# Patient Record
Sex: Female | Born: 1935 | Race: White | Hispanic: No | Smoking: Never smoker
Health system: Southern US, Community
[De-identification: ages and names within clinical notes are randomized; demographics above are authoritative.]

## PROBLEM LIST (undated history)

## (undated) DIAGNOSIS — G300 Alzheimer's disease with early onset: Secondary | ICD-10-CM

## (undated) DIAGNOSIS — R011 Cardiac murmur, unspecified: Secondary | ICD-10-CM

## (undated) DIAGNOSIS — I10 Essential (primary) hypertension: Secondary | ICD-10-CM

## (undated) DIAGNOSIS — F32A Depression, unspecified: Secondary | ICD-10-CM

## (undated) DIAGNOSIS — F419 Anxiety disorder, unspecified: Secondary | ICD-10-CM

## (undated) DIAGNOSIS — F0280 Dementia in other diseases classified elsewhere without behavioral disturbance: Secondary | ICD-10-CM

## (undated) DIAGNOSIS — K5790 Diverticulosis of intestine, part unspecified, without perforation or abscess without bleeding: Secondary | ICD-10-CM

## (undated) DIAGNOSIS — Z8701 Personal history of pneumonia (recurrent): Secondary | ICD-10-CM

## (undated) DIAGNOSIS — C4491 Basal cell carcinoma of skin, unspecified: Secondary | ICD-10-CM

## (undated) DIAGNOSIS — M1990 Unspecified osteoarthritis, unspecified site: Secondary | ICD-10-CM

## (undated) DIAGNOSIS — K5909 Other constipation: Secondary | ICD-10-CM

## (undated) DIAGNOSIS — G4733 Obstructive sleep apnea (adult) (pediatric): Secondary | ICD-10-CM

## (undated) DIAGNOSIS — F329 Major depressive disorder, single episode, unspecified: Secondary | ICD-10-CM

## (undated) HISTORY — DX: Personal history of pneumonia (recurrent): Z87.01

## (undated) HISTORY — DX: Anxiety disorder, unspecified: F41.9

## (undated) HISTORY — DX: Unspecified osteoarthritis, unspecified site: M19.90

## (undated) HISTORY — DX: Obstructive sleep apnea (adult) (pediatric): G47.33

## (undated) HISTORY — DX: Diverticulosis of intestine, part unspecified, without perforation or abscess without bleeding: K57.90

## (undated) HISTORY — DX: Other constipation: K59.09

## (undated) HISTORY — DX: Dementia in other diseases classified elsewhere, unspecified severity, without behavioral disturbance, psychotic disturbance, mood disturbance, and anxiety: F02.80

## (undated) HISTORY — DX: Cardiac murmur, unspecified: R01.1

## (undated) HISTORY — PX: OTHER SURGICAL HISTORY: SHX169

## (undated) HISTORY — DX: Alzheimer's disease with early onset: G30.0

## (undated) HISTORY — DX: Basal cell carcinoma of skin, unspecified: C44.91

## (undated) HISTORY — DX: Essential (primary) hypertension: I10

---

## 1994-11-30 HISTORY — PX: OTHER SURGICAL HISTORY: SHX169

## 2008-02-01 ENCOUNTER — Ambulatory Visit (HOSPITAL_COMMUNITY): Admission: RE | Admit: 2008-02-01 | Discharge: 2008-02-01 | Payer: Self-pay | Admitting: Interventional Radiology

## 2008-02-01 ENCOUNTER — Encounter (INDEPENDENT_AMBULATORY_CARE_PROVIDER_SITE_OTHER): Payer: Self-pay | Admitting: Interventional Radiology

## 2008-02-13 ENCOUNTER — Encounter: Admission: RE | Admit: 2008-02-13 | Discharge: 2008-02-13 | Payer: Self-pay | Admitting: Interventional Radiology

## 2008-02-17 ENCOUNTER — Encounter: Payer: Self-pay | Admitting: Interventional Radiology

## 2008-03-05 ENCOUNTER — Encounter: Admission: RE | Admit: 2008-03-05 | Discharge: 2008-03-05 | Payer: Self-pay | Admitting: Interventional Radiology

## 2008-03-19 ENCOUNTER — Encounter: Admission: RE | Admit: 2008-03-19 | Discharge: 2008-03-19 | Payer: Self-pay | Admitting: Interventional Radiology

## 2008-07-12 ENCOUNTER — Ambulatory Visit: Payer: Self-pay | Admitting: Cardiology

## 2008-09-06 ENCOUNTER — Ambulatory Visit (HOSPITAL_COMMUNITY): Admission: RE | Admit: 2008-09-06 | Discharge: 2008-09-06 | Payer: Self-pay | Admitting: Ophthalmology

## 2008-09-27 ENCOUNTER — Ambulatory Visit (HOSPITAL_COMMUNITY): Admission: RE | Admit: 2008-09-27 | Discharge: 2008-09-27 | Payer: Self-pay | Admitting: Ophthalmology

## 2011-04-14 NOTE — Consult Note (Signed)
NAME:  Tracy Hayes, Tracy Hayes             ACCOUNT NO.:  [id number]   MEDICAL RECORD NO.:  [id number]          PATIENT TYPE:  OUT   LOCATION:  XRAY                         FACILITY:  [facility]   PHYSICIAN:  Sanjeev K. Deveshwar, M.D.DATE OF BIRTH:  [birth year]   DATE OF CONSULTATION:  01/26/2008  DATE OF DISCHARGE:                                 CONSULTATION   DATE OF CONSULTATION:  January 26, 2008.   CHIEF COMPLAINT:  Compression fracture.   HISTORY OF PRESENT ILLNESS:  This is a very pleasant 77 year old female  referred to Dr. Corliss Skains through the courtesy of Dr. Donzetta Sprung after  she developed low back pain approximately 4 weeks ago after she tried to  move a bed.  An MRI was performed on January 12, 2008 that showed an L5  compression fracture.  The patient was also noted to have foraminal  narrowing of the L5-S1 area.   As noted, the patient has had pain for at least 4 weeks.  She rates her  pain at times as a 2 on a 1-10 scale, at other times is a 10 on a 1-10  scale.  She is currently being treated with hydrocodone which does take  the edge off her pain.  However, she has been unable to find complete  relief from the pain.  Prior to this injury, she was very independent  and active.  She was able is to do all of her own self-care, all of her  own household chores and she was also able to care for other members of  the family who had been ill.  Now, she is very dependent.  She is unable  to do any of her own self-care other than she can ambulate to the  bathroom with a walker.  The patient and her family are anxious to find  some relief from her discomfort.  She presents today accompanied by her  son and daughter.   PAST MEDICAL HISTORY:  Significant for sinusitis, osteoarthritis,  osteoporosis, anxiety disorder, history of colon polyps, polymyalgia  rheumatica.  She has had some sort of thyroid problems.  However, she  reports that this has not been active recently.  She  occasionally  develops bronchitis.  She reports that she has had a bone density study  performed which was consistent with osteoporosis.   PAST SURGICAL HISTORY:  The patient has had a tonsillectomy.  She had a  skin cancer removed from her nose.  She had a knee replacement  approximately 4-6 years ago.  She reports that she occasionally has  nausea and vomiting as well as trouble waking up from anesthesia.   ALLERGIES:  1. ASPIRIN which causes fever and itching.  2. SULFA.  3. CAFFEINE.  4. CODEINE.  5. VITAMIN B12.   CURRENT MEDICATIONS:  1. Prednisone 5 mg daily.  2. Lisinopril 20 mg daily.  3. Cymbalta 30 mg.  4. Lorazepam 0.5 mg.  Some of these frequencies are not clear from the      list that the patient brought.  5. Calcium with vitamin D.  6. Fosamax 70 mg once a  week.  7. Hydrocodone for pain.  8. Flexeril 10 mg at bedtime for spasm.   SOCIAL HISTORY:  The patient is widowed.  She has 3 children.  She lives  in [place].  She has never smoked or used alcohol.  She has been active all  of her life.  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   FAMILY HISTORY:  Mother died at age 49 from CVA.  Her father died at age  52 from CVA.  He also had coronary artery disease.   IMPRESSION/PLAN:  As noted, the patient presents today for evaluation of  her L5 compression fracture.  Dr. Corliss Skains reviewed the results of the  MRI which the family provided on a disk from [facility].  He  pointed out the L5 fracture.  He also pointed out the foraminal  narrowing at L5-S1.  She has had radiation of pain down her right leg.  Dr. Corliss Skains feels it is due to this narrowing.  He is hoping that  treating the compression fracture will also improve this discomfort.   The compression fractures were discussed in general.  The kyphoplasty  procedure as well as vertebroplasty procedures were also explained along  with the risks and  benefits.  The patient and her family were shown a  video describing these fractures and the kyphoplasty procedure.  The  risks and benefits were discussed.  All of their questions were  answered.  They would like to go ahead and schedule the intervention for  relief of pain and stabilization of the fracture.   Greater than 40 minutes were spent on this consult.      Delton See, P.A.    ______________________________  Grandville Silos. Corliss Skains, M.D.    DR/MEDQ  D:  01/26/2008  T:  01/27/2008  Job:  [id number]   cc:   Donzetta Sprung

## 2011-04-14 NOTE — Consult Note (Signed)
NAME:  Tracy Hayes, Tracy Hayes             ACCOUNT NO.:  [id number]   MEDICAL RECORD NO.:  [id number]          PATIENT TYPE:  OUT   LOCATION:  XRAY                         FACILITY:  [facility]   PHYSICIAN:  Sanjeev K. Deveshwar, M.D.DATE OF BIRTH:  29-Jul-1924   DATE OF CONSULTATION:  DATE OF DISCHARGE:                                 CONSULTATION   NOTE:  This is a followup visit, February 17, 2008.   CHIEF COMPLAINT:  Status post L5 kyphoplasty performed January 31, 2008.   BRIEF HISTORY:  This is a very pleasant 77 year old female referred to  Dr. Corliss Skains through the courtesy of Dr. Donzetta Sprung for evaluation of  a compression fracture at the L5 level which the patient developed when  she tried to move the bed.  An MRI was performed January 12, 2008, that  documented the L5 compression fracture but also showed foraminal  narrowing at L5-S1.   The patient underwent a kyphoplasty performed by Dr. Corliss Skains on January 31, 2008.  She also had a biopsy at that time that was negative for any  sign of malignancy.  The patient had some relief initially after her  kyphoplasty, however, she later developed recurrent pain in her right  hip radiating down her right leg.  Dr. Corliss Skains was contacted.  He felt  that this was probably due to her foraminal narrowing.  The patient was  referred for epidural injections.  She saw Dr. Benard Rink March 16 for an  epidural injection.  The patient stated that she had good relief of her  pain for 2-3 days, however, the pain has once again returned.  The  patient presents to the office today, March 20, accompanied by her  daughter to be seen in followup by Dr. Corliss Skains.   PAST MEDICAL HISTORY:  Significant for osteoporosis, history of thyroid  problems.  She has polymyalgia rheumatica.  She has history of anxiety.  She has a history of bronchitis.   ALLERGIES:  She is allergic to ASPIRIN, SULFA, CAFFEINE, CODEINE and  VITAMIN B12.   For medication list, social  history, family history and surgical history  please see dictated consult note from January 26, 2008.   IMPRESSION AND PLAN:  As noted the patient returns today to be seen in  followup by Dr. Corliss Skains.  She had an L5 kyphoplasty performed January 31, 2008, after which she obtained some relief from her back pain.  The back  pain has not returned although she is having pain in her right hip  radiating down her right leg.  Dr. Corliss Skains referred the patient for  epidural injections.  She had the first injection performed on March 16  by Dr. Benard Rink after which she obtained good relief for several days.  However, her pain has now returned.  She had previously been taking  hydrocodone for the pain, however, this caused nausea.  She now takes  Tylenol 2 tablets once or twice per day and gets some relief.   The patient reports that her pain is better when she is seated.  However, when she is up for any period of  time the pain returns and is  quite severe.  She is able to walk short distances with her walker,  although she is very dependent at this time.  Her daughter helps her  with her bathing and her dressing.  Previously the patient was very  independent and was actually caring for her older sister.   Dr. Corliss Skains has recommended continuing with the epidural injections.  After three injections if the patient is still having pain she is to  contact Dr. Corliss Skains who will refer the patient to a neurosurgeon to  see if there might be some other possible treatments for the patient's  discomfort.   The patient does have a history of osteoporosis.  She is on Fosamax.  She has had a previous bone density study in the past, apparently 6-7  years ago at [facility].  Dr. Corliss Skains recommended that she have  a repeat bone density study at [facility] for further evaluation  of her osteoporosis.   Greater than 15 minutes was spent on this consult.  Dr. Corliss Skains also  reviewed the  images from the previous kyphoplasty with the patient and  her daughter during this visit.  All their questions were answered.  They leave with a good understanding of the plan at this point.      Delton See, P.A.    ______________________________  Grandville Silos. Corliss Skains, M.D.    DR/MEDQ  D:  02/17/2008  T:  02/17/2008  Job:  [id number]   cc:   Henrene Pastor, M.D.

## 2011-08-24 LAB — CBC
HCT: 37.3
Hemoglobin: 12.6
MCHC: 33.9
RBC: 4.05

## 2011-08-24 LAB — BASIC METABOLIC PANEL
CO2: 26
Calcium: 9.2
GFR calc Af Amer: >60
Glucose, Bld: 108 — ABNORMAL HIGH
Potassium: 3.8
Sodium: 137

## 2011-08-24 LAB — PROTIME-INR: INR: 1

## 2011-09-01 LAB — BASIC METABOLIC PANEL
BUN: 23
CO2: 28
Chloride: 104
GFR calc Af Amer: >60
Potassium: 4.5

## 2011-09-01 LAB — HEMOGLOBIN AND HEMATOCRIT, BLOOD
HCT: 38.6
Hemoglobin: 13.1

## 2012-06-23 DIAGNOSIS — M6281 Muscle weakness (generalized): Secondary | ICD-10-CM

## 2014-04-17 ENCOUNTER — Encounter: Payer: Self-pay | Admitting: Cardiology

## 2014-04-30 ENCOUNTER — Encounter: Payer: Self-pay | Admitting: *Deleted

## 2014-05-02 ENCOUNTER — Encounter: Payer: Self-pay | Admitting: Cardiology

## 2014-05-02 ENCOUNTER — Other Ambulatory Visit: Payer: Self-pay | Admitting: *Deleted

## 2014-05-02 ENCOUNTER — Ambulatory Visit (INDEPENDENT_AMBULATORY_CARE_PROVIDER_SITE_OTHER): Payer: Medicare Other | Admitting: Cardiology

## 2014-05-02 VITALS — BP 144/82 | HR 67 | Ht 60.0 in | Wt 131.4 lb

## 2014-05-02 DIAGNOSIS — I10 Essential (primary) hypertension: Secondary | ICD-10-CM

## 2014-05-02 DIAGNOSIS — R55 Syncope and collapse: Secondary | ICD-10-CM

## 2014-05-02 NOTE — Progress Notes (Signed)
Clinical Summary Tracy Hayes is an 80 y.o.female referred for cardiology consultation by Dr. Quillian Quince. Record review finds recent observation at [facility] in May following an episode of apparent syncope. She was reportedly standing in her kitchen and lost consciousness, fell forward losing an incisor, no other major injuries. Head CT and head MRI did not show any acute events - had atrophy and chronic ischemic changes. Elevated d-dimer noted although no pulmonary embolus by chest CT. Cardiac markers argued against ACS, she had no arrhythmias by telemetry monitoring. Echocardiogram reported an LVEF of 60-65% with abnormal diastolic function, trace mitral regurgitation, RVSP 33 mm mercury.  She is here with her son today. She states that she has occasional feelings of dizziness, nothing progressive. Her son indicates that she has had some falls, etiologies have not always been clear. She does not recall any precipitating symptoms prior to the event outlined above, specifically was not dizzy, did not feel any vertigo, and had no chest pain or palpitations.  It sounds as if she has been documented to have orthostasis previously, although this was not the case in the hospital at [facility].  Recent ECG during her hospital observation showed sinus rhythm with right bundle branch block and LVH.    Allergies  Allergen Reactions  . Aspirin   . Benadryl [Diphenhydramine Hcl]   . Codeine   . Penicillins   . Phenobarbital   . Sulfa Antibiotics   . Vitamin B12   . Caffeine Palpitations    Current Outpatient Prescriptions  Medication Sig Dispense Refill  . alendronate (FOSAMAX) 70 MG tablet Take 70 mg by mouth once a week. Take with a full glass of water on an empty stomach.      Marland Kitchen LORazepam (ATIVAN) 0.5 MG tablet Take 0.5 mg by mouth every 8 (eight) hours as needed for anxiety.      Marland Kitchen losartan-hydrochlorothiazide (HYZAAR) 100-12.5 MG per tablet Take 1 tablet by mouth daily.      . sertraline (ZOLOFT)  50 MG tablet Take 50 mg by mouth 2 (two) times daily.       No current facility-administered medications for this visit.    Past Medical History  Diagnosis Date  . Essential hypertension, benign   . OSA (obstructive sleep apnea)   . Anxiety   . Basal cell carcinoma   . Cardiac murmur   . Diverticulosis   . DJD (degenerative joint disease)   . Alzheimer's disease with early onset   . Chronic constipation   . History of pneumonia     Past Surgical History  Procedure Laterality Date  . S/p nasal tip resection  1996  . S/p polypectomy      Family History  Problem Relation Age of Onset  . Stroke Mother   . Stroke Father     Social History Ms. Geary reports that she has never smoked. She has never used smokeless tobacco. Ms. Zertuche reports that she does not drink alcohol.  Review of Systems No exertional chest pain. Uses a walker intermittently. Stable appetite. No cough, fevers or chills. No bleeding problems. Otherwise as outlined above.  Physical Examination Filed Vitals:   05/02/14 0915  BP: 144/82  Pulse: 67   Filed Weights   05/02/14 0915  Weight: 131 lb 6.4 oz (59.603 kg)   Elderly woman, appears comfortable at rest. HEENT: Conjunctiva and lids normal, oropharynx clear. Neck: Supple, no elevated JVP or carotid bruits, no thyromegaly. Lungs: Clear to auscultation, nonlabored breathing at rest. Cardiac: Regular  rate and rhythm, S4 with soft systolic murmur, no pericardial rub. Abdomen: Soft, nontender, bowel sounds present, no guarding or rebound. Extremities: No pitting edema, distal pulses 1-2+. Skin: Warm and dry. Musculoskeletal: No kyphosis. Neuropsychiatric: Alert and oriented x3, affect grossly appropriate.   Problem List and Plan   Syncope Event as described above, fairly sudden onset while standing without obvious precipitant. She does have a reported propensity to orthostasis, although not documented during her hospital observation. ECG showed  sinus rhythm with right bundle branch block, no arrhythmias by telemetry, normal LVEF by echocardiogram, no ACS by enzymes. No obvious stroke by neuroimaging. Discussed with patient and son. Plan will be to obtain a 30 day event recorder to investigate any potential arrhythmic causes. Followup arranged.  Essential hypertension, benign Patient on Hyzaar, followed by Dr. Quillian Quince.    Satira Sark, M.D., F.A.C.C.

## 2014-05-02 NOTE — Assessment & Plan Note (Signed)
Patient on Hyzaar, followed by Dr. Quillian Quince.

## 2014-05-02 NOTE — Patient Instructions (Signed)
Your physician recommends that you schedule a follow-up appointment in: 1 month. Your physician recommends that you continue on your current medications as directed. Please refer to the Current Medication list given to you today. Your physician has recommended that you wear an event monitor. Event monitors are medical devices that record the heart's electrical activity. Doctors most often [place] these monitors to diagnose arrhythmias. Arrhythmias are problems with the speed or rhythm of the heartbeat. The monitor is a small, portable device. You can wear one while you do your normal daily activities. This is usually used to diagnose what is causing palpitations/syncope (passing out). [employer] will contact you about this monitor.

## 2014-05-02 NOTE — Assessment & Plan Note (Signed)
Event as described above, fairly sudden onset while standing without obvious precipitant. She does have a reported propensity to orthostasis, although not documented during her hospital observation. ECG showed sinus rhythm with right bundle branch block, no arrhythmias by telemetry, normal LVEF by echocardiogram, no ACS by enzymes. No obvious stroke by neuroimaging. Discussed with patient and son. Plan will be to obtain a 30 day event recorder to investigate any potential arrhythmic causes. Followup arranged.

## 2014-05-05 DIAGNOSIS — R55 Syncope and collapse: Secondary | ICD-10-CM

## 2014-06-12 ENCOUNTER — Ambulatory Visit (INDEPENDENT_AMBULATORY_CARE_PROVIDER_SITE_OTHER): Payer: Medicare Other | Admitting: Cardiology

## 2014-06-12 ENCOUNTER — Encounter: Payer: Self-pay | Admitting: Cardiology

## 2014-06-12 VITALS — BP 156/73 | HR 66 | Ht 60.0 in | Wt 130.1 lb

## 2014-06-12 DIAGNOSIS — R55 Syncope and collapse: Secondary | ICD-10-CM

## 2014-06-12 NOTE — Progress Notes (Signed)
Clinical Summary Tracy Hayes is an 80 y.o.female seen in the office back in June for further evaluation of syncope. She had undergone observation at [facility] in May following an episode of apparent syncope. She was reportedly standing in her kitchen and lost consciousness, fell forward losing an incisor, no other major injuries. Head CT and head MRI did not show any acute events - had atrophy and chronic ischemic changes. Elevated d-dimer noted although no pulmonary embolus by chest CT. Cardiac markers argued against ACS, she had no arrhythmias by telemetry monitoring. Echocardiogram reported an LVEF of 60-65% with abnormal diastolic function, trace mitral regurgitation, RVSP 33 mm mercury.  Followup cardiac monitor was arranged. [employer] monitor showed sinus rhythm with PAC, no pauses or arrhythmias noted however. I reviewed these results with the patient and her granddaughter today.  It is not clear that she has had any subsequent syncopal events, although she does tell me about a spell when she was talking to a friend and does not remember much about the conversation after she left.   Allergies  Allergen Reactions  . Aspirin   . Benadryl [Diphenhydramine Hcl]   . Codeine   . Penicillins   . Phenobarbital   . Sulfa Antibiotics   . Vitamin B12   . Caffeine Palpitations    Current Outpatient Prescriptions  Medication Sig Dispense Refill  . alendronate (FOSAMAX) 70 MG tablet Take 70 mg by mouth once a week. Take with a full glass of water on an empty stomach.      Marland Kitchen LORazepam (ATIVAN) 0.5 MG tablet Take 0.5 mg by mouth every 8 (eight) hours as needed for anxiety.      Marland Kitchen losartan-hydrochlorothiazide (HYZAAR) 100-12.5 MG per tablet Take 1 tablet by mouth daily.      . sertraline (ZOLOFT) 50 MG tablet Take 50 mg by mouth 2 (two) times daily.       No current facility-administered medications for this visit.    Past Medical History  Diagnosis Date  . Essential hypertension, benign   .  OSA (obstructive sleep apnea)   . Anxiety   . Basal cell carcinoma   . Cardiac murmur   . Diverticulosis   . DJD (degenerative joint disease)   . Alzheimer's disease with early onset   . Chronic constipation   . History of pneumonia     Social History Tracy Hayes reports that she has never smoked. She has never used smokeless tobacco. Tracy Hayes reports that she does not drink alcohol.  Review of Systems Sometimes unsteady on her feet, uses a walker intermittently. Other systems reviewed and negative.  Physical Examination Filed Vitals:   06/12/14 1513  BP: 156/73  Pulse: 66   Filed Weights   06/12/14 1513  Weight: 130 lb 1.9 oz (59.022 kg)    Elderly woman, appears comfortable at rest.  Seated in wheelchair. HEENT: Conjunctiva and lids normal, oropharynx clear.  Neck: Supple, no elevated JVP or carotid bruits, no thyromegaly.  Lungs: Clear to auscultation, nonlabored breathing at rest.  Cardiac: Regular rate and rhythm, S4 with soft systolic murmur, no pericardial rub.  Abdomen: Soft, nontender, bowel sounds present, no guarding or rebound.  Extremities: No pitting edema, distal pulses 1-2+.     Problem List and Plan   Syncope Etiology not clear, no definitive cardiac cause however documented. It is reassuring that she had recent documentation of normal LVEF, and cardiac monitoring did not demonstrate any pauses or significant tachyarrhythmias. No further cardiac workup planned  at this time. Certainly, if she continues to manifest sudden syncopal events without obvious cause, an implantable loop recorder could be considered.    Satira Sark, M.D., F.A.C.C.

## 2014-06-12 NOTE — Patient Instructions (Signed)
Your physician recommends that you schedule a follow-up appointment in: as needed Your physician recommends that you continue on your current medications as directed. Please refer to the Current Medication list given to you today.   

## 2014-06-12 NOTE — Assessment & Plan Note (Signed)
Etiology not clear, no definitive cardiac cause however documented. It is reassuring that she had recent documentation of normal LVEF, and cardiac monitoring did not demonstrate any pauses or significant tachyarrhythmias. No further cardiac workup planned at this time. Certainly, if she continues to manifest sudden syncopal events without obvious cause, an implantable loop recorder could be considered.

## 2014-12-11 DIAGNOSIS — E6609 Other obesity due to excess calories: Secondary | ICD-10-CM | POA: Diagnosis not present

## 2014-12-11 DIAGNOSIS — E782 Mixed hyperlipidemia: Secondary | ICD-10-CM | POA: Diagnosis not present

## 2014-12-11 DIAGNOSIS — G4733 Obstructive sleep apnea (adult) (pediatric): Secondary | ICD-10-CM | POA: Diagnosis not present

## 2014-12-11 DIAGNOSIS — E0580 Other thyrotoxicosis without thyrotoxic crisis or storm: Secondary | ICD-10-CM | POA: Diagnosis not present

## 2014-12-11 DIAGNOSIS — Z9189 Other specified personal risk factors, not elsewhere classified: Secondary | ICD-10-CM | POA: Diagnosis not present

## 2014-12-11 DIAGNOSIS — I10 Essential (primary) hypertension: Secondary | ICD-10-CM | POA: Diagnosis not present

## 2014-12-11 DIAGNOSIS — G300 Alzheimer's disease with early onset: Secondary | ICD-10-CM | POA: Diagnosis not present

## 2014-12-11 DIAGNOSIS — F325 Major depressive disorder, single episode, in full remission: Secondary | ICD-10-CM | POA: Diagnosis not present

## 2014-12-14 DIAGNOSIS — G300 Alzheimer's disease with early onset: Secondary | ICD-10-CM | POA: Diagnosis not present

## 2014-12-15 DIAGNOSIS — G300 Alzheimer's disease with early onset: Secondary | ICD-10-CM | POA: Diagnosis not present

## 2014-12-16 DIAGNOSIS — G300 Alzheimer's disease with early onset: Secondary | ICD-10-CM | POA: Diagnosis not present

## 2014-12-17 DIAGNOSIS — G300 Alzheimer's disease with early onset: Secondary | ICD-10-CM | POA: Diagnosis not present

## 2014-12-18 DIAGNOSIS — G300 Alzheimer's disease with early onset: Secondary | ICD-10-CM | POA: Diagnosis not present

## 2014-12-19 DIAGNOSIS — G300 Alzheimer's disease with early onset: Secondary | ICD-10-CM | POA: Diagnosis not present

## 2014-12-20 DIAGNOSIS — G300 Alzheimer's disease with early onset: Secondary | ICD-10-CM | POA: Diagnosis not present

## 2014-12-21 DIAGNOSIS — G300 Alzheimer's disease with early onset: Secondary | ICD-10-CM | POA: Diagnosis not present

## 2014-12-22 DIAGNOSIS — G300 Alzheimer's disease with early onset: Secondary | ICD-10-CM | POA: Diagnosis not present

## 2014-12-23 DIAGNOSIS — G300 Alzheimer's disease with early onset: Secondary | ICD-10-CM | POA: Diagnosis not present

## 2014-12-24 DIAGNOSIS — G300 Alzheimer's disease with early onset: Secondary | ICD-10-CM | POA: Diagnosis not present

## 2014-12-25 DIAGNOSIS — G300 Alzheimer's disease with early onset: Secondary | ICD-10-CM | POA: Diagnosis not present

## 2014-12-26 DIAGNOSIS — G300 Alzheimer's disease with early onset: Secondary | ICD-10-CM | POA: Diagnosis not present

## 2014-12-27 DIAGNOSIS — G300 Alzheimer's disease with early onset: Secondary | ICD-10-CM | POA: Diagnosis not present

## 2014-12-28 DIAGNOSIS — G300 Alzheimer's disease with early onset: Secondary | ICD-10-CM | POA: Diagnosis not present

## 2014-12-29 DIAGNOSIS — G300 Alzheimer's disease with early onset: Secondary | ICD-10-CM | POA: Diagnosis not present

## 2014-12-30 DIAGNOSIS — G300 Alzheimer's disease with early onset: Secondary | ICD-10-CM | POA: Diagnosis not present

## 2014-12-31 DIAGNOSIS — G300 Alzheimer's disease with early onset: Secondary | ICD-10-CM | POA: Diagnosis not present

## 2015-01-01 DIAGNOSIS — G300 Alzheimer's disease with early onset: Secondary | ICD-10-CM | POA: Diagnosis not present

## 2015-01-02 DIAGNOSIS — G300 Alzheimer's disease with early onset: Secondary | ICD-10-CM | POA: Diagnosis not present

## 2015-01-03 DIAGNOSIS — G300 Alzheimer's disease with early onset: Secondary | ICD-10-CM | POA: Diagnosis not present

## 2015-01-04 DIAGNOSIS — G300 Alzheimer's disease with early onset: Secondary | ICD-10-CM | POA: Diagnosis not present

## 2015-01-05 DIAGNOSIS — G300 Alzheimer's disease with early onset: Secondary | ICD-10-CM | POA: Diagnosis not present

## 2015-01-06 DIAGNOSIS — G300 Alzheimer's disease with early onset: Secondary | ICD-10-CM | POA: Diagnosis not present

## 2015-01-07 DIAGNOSIS — G300 Alzheimer's disease with early onset: Secondary | ICD-10-CM | POA: Diagnosis not present

## 2015-01-08 DIAGNOSIS — G300 Alzheimer's disease with early onset: Secondary | ICD-10-CM | POA: Diagnosis not present

## 2015-01-09 DIAGNOSIS — G300 Alzheimer's disease with early onset: Secondary | ICD-10-CM | POA: Diagnosis not present

## 2015-01-10 DIAGNOSIS — G300 Alzheimer's disease with early onset: Secondary | ICD-10-CM | POA: Diagnosis not present

## 2015-01-11 DIAGNOSIS — G300 Alzheimer's disease with early onset: Secondary | ICD-10-CM | POA: Diagnosis not present

## 2015-01-12 DIAGNOSIS — G300 Alzheimer's disease with early onset: Secondary | ICD-10-CM | POA: Diagnosis not present

## 2015-01-13 DIAGNOSIS — G300 Alzheimer's disease with early onset: Secondary | ICD-10-CM | POA: Diagnosis not present

## 2015-01-14 DIAGNOSIS — G300 Alzheimer's disease with early onset: Secondary | ICD-10-CM | POA: Diagnosis not present

## 2015-01-15 DIAGNOSIS — G300 Alzheimer's disease with early onset: Secondary | ICD-10-CM | POA: Diagnosis not present

## 2015-01-16 DIAGNOSIS — G300 Alzheimer's disease with early onset: Secondary | ICD-10-CM | POA: Diagnosis not present

## 2015-01-17 DIAGNOSIS — G300 Alzheimer's disease with early onset: Secondary | ICD-10-CM | POA: Diagnosis not present

## 2015-01-18 DIAGNOSIS — G300 Alzheimer's disease with early onset: Secondary | ICD-10-CM | POA: Diagnosis not present

## 2015-01-19 DIAGNOSIS — G300 Alzheimer's disease with early onset: Secondary | ICD-10-CM | POA: Diagnosis not present

## 2015-01-20 DIAGNOSIS — G300 Alzheimer's disease with early onset: Secondary | ICD-10-CM | POA: Diagnosis not present

## 2015-01-21 DIAGNOSIS — G300 Alzheimer's disease with early onset: Secondary | ICD-10-CM | POA: Diagnosis not present

## 2015-01-22 DIAGNOSIS — G300 Alzheimer's disease with early onset: Secondary | ICD-10-CM | POA: Diagnosis not present

## 2015-01-23 DIAGNOSIS — G300 Alzheimer's disease with early onset: Secondary | ICD-10-CM | POA: Diagnosis not present

## 2015-01-24 DIAGNOSIS — G300 Alzheimer's disease with early onset: Secondary | ICD-10-CM | POA: Diagnosis not present

## 2015-01-25 DIAGNOSIS — G300 Alzheimer's disease with early onset: Secondary | ICD-10-CM | POA: Diagnosis not present

## 2015-01-26 DIAGNOSIS — G300 Alzheimer's disease with early onset: Secondary | ICD-10-CM | POA: Diagnosis not present

## 2015-01-27 DIAGNOSIS — G300 Alzheimer's disease with early onset: Secondary | ICD-10-CM | POA: Diagnosis not present

## 2015-01-28 DIAGNOSIS — G300 Alzheimer's disease with early onset: Secondary | ICD-10-CM | POA: Diagnosis not present

## 2015-01-29 DIAGNOSIS — R32 Unspecified urinary incontinence: Secondary | ICD-10-CM | POA: Diagnosis not present

## 2015-01-29 DIAGNOSIS — G300 Alzheimer's disease with early onset: Secondary | ICD-10-CM | POA: Diagnosis not present

## 2015-01-30 DIAGNOSIS — G300 Alzheimer's disease with early onset: Secondary | ICD-10-CM | POA: Diagnosis not present

## 2015-01-31 DIAGNOSIS — G300 Alzheimer's disease with early onset: Secondary | ICD-10-CM | POA: Diagnosis not present

## 2015-02-01 DIAGNOSIS — G300 Alzheimer's disease with early onset: Secondary | ICD-10-CM | POA: Diagnosis not present

## 2015-02-02 DIAGNOSIS — G300 Alzheimer's disease with early onset: Secondary | ICD-10-CM | POA: Diagnosis not present

## 2015-02-03 DIAGNOSIS — G300 Alzheimer's disease with early onset: Secondary | ICD-10-CM | POA: Diagnosis not present

## 2015-02-04 DIAGNOSIS — G300 Alzheimer's disease with early onset: Secondary | ICD-10-CM | POA: Diagnosis not present

## 2015-02-05 DIAGNOSIS — G300 Alzheimer's disease with early onset: Secondary | ICD-10-CM | POA: Diagnosis not present

## 2015-02-06 DIAGNOSIS — G300 Alzheimer's disease with early onset: Secondary | ICD-10-CM | POA: Diagnosis not present

## 2015-02-07 DIAGNOSIS — G300 Alzheimer's disease with early onset: Secondary | ICD-10-CM | POA: Diagnosis not present

## 2015-02-08 DIAGNOSIS — G300 Alzheimer's disease with early onset: Secondary | ICD-10-CM | POA: Diagnosis not present

## 2015-02-09 DIAGNOSIS — G300 Alzheimer's disease with early onset: Secondary | ICD-10-CM | POA: Diagnosis not present

## 2015-02-10 DIAGNOSIS — G300 Alzheimer's disease with early onset: Secondary | ICD-10-CM | POA: Diagnosis not present

## 2015-02-11 DIAGNOSIS — G300 Alzheimer's disease with early onset: Secondary | ICD-10-CM | POA: Diagnosis not present

## 2015-02-12 DIAGNOSIS — G300 Alzheimer's disease with early onset: Secondary | ICD-10-CM | POA: Diagnosis not present

## 2015-02-13 DIAGNOSIS — G300 Alzheimer's disease with early onset: Secondary | ICD-10-CM | POA: Diagnosis not present

## 2015-02-14 DIAGNOSIS — G300 Alzheimer's disease with early onset: Secondary | ICD-10-CM | POA: Diagnosis not present

## 2015-02-15 DIAGNOSIS — G300 Alzheimer's disease with early onset: Secondary | ICD-10-CM | POA: Diagnosis not present

## 2015-02-16 DIAGNOSIS — G300 Alzheimer's disease with early onset: Secondary | ICD-10-CM | POA: Diagnosis not present

## 2015-02-17 DIAGNOSIS — G300 Alzheimer's disease with early onset: Secondary | ICD-10-CM | POA: Diagnosis not present

## 2015-02-18 DIAGNOSIS — G300 Alzheimer's disease with early onset: Secondary | ICD-10-CM | POA: Diagnosis not present

## 2015-02-19 DIAGNOSIS — G300 Alzheimer's disease with early onset: Secondary | ICD-10-CM | POA: Diagnosis not present

## 2015-02-20 DIAGNOSIS — G300 Alzheimer's disease with early onset: Secondary | ICD-10-CM | POA: Diagnosis not present

## 2015-02-21 DIAGNOSIS — G300 Alzheimer's disease with early onset: Secondary | ICD-10-CM | POA: Diagnosis not present

## 2015-02-22 DIAGNOSIS — G300 Alzheimer's disease with early onset: Secondary | ICD-10-CM | POA: Diagnosis not present

## 2015-02-23 DIAGNOSIS — G300 Alzheimer's disease with early onset: Secondary | ICD-10-CM | POA: Diagnosis not present

## 2015-02-24 DIAGNOSIS — G300 Alzheimer's disease with early onset: Secondary | ICD-10-CM | POA: Diagnosis not present

## 2015-02-25 DIAGNOSIS — G300 Alzheimer's disease with early onset: Secondary | ICD-10-CM | POA: Diagnosis not present

## 2015-02-26 DIAGNOSIS — G300 Alzheimer's disease with early onset: Secondary | ICD-10-CM | POA: Diagnosis not present

## 2015-02-27 DIAGNOSIS — F329 Major depressive disorder, single episode, unspecified: Secondary | ICD-10-CM | POA: Diagnosis not present

## 2015-02-27 DIAGNOSIS — M25552 Pain in left hip: Secondary | ICD-10-CM | POA: Diagnosis not present

## 2015-02-27 DIAGNOSIS — M25551 Pain in right hip: Secondary | ICD-10-CM | POA: Diagnosis not present

## 2015-02-27 DIAGNOSIS — M353 Polymyalgia rheumatica: Secondary | ICD-10-CM | POA: Diagnosis not present

## 2015-02-27 DIAGNOSIS — I10 Essential (primary) hypertension: Secondary | ICD-10-CM | POA: Diagnosis not present

## 2015-02-27 DIAGNOSIS — M545 Low back pain: Secondary | ICD-10-CM | POA: Diagnosis not present

## 2015-02-27 DIAGNOSIS — M810 Age-related osteoporosis without current pathological fracture: Secondary | ICD-10-CM | POA: Diagnosis not present

## 2015-02-27 DIAGNOSIS — G309 Alzheimer's disease, unspecified: Secondary | ICD-10-CM | POA: Diagnosis not present

## 2015-02-27 DIAGNOSIS — G300 Alzheimer's disease with early onset: Secondary | ICD-10-CM | POA: Diagnosis not present

## 2015-02-28 DIAGNOSIS — M545 Low back pain: Secondary | ICD-10-CM | POA: Diagnosis not present

## 2015-02-28 DIAGNOSIS — M810 Age-related osteoporosis without current pathological fracture: Secondary | ICD-10-CM | POA: Diagnosis not present

## 2015-02-28 DIAGNOSIS — M25551 Pain in right hip: Secondary | ICD-10-CM | POA: Diagnosis not present

## 2015-02-28 DIAGNOSIS — G300 Alzheimer's disease with early onset: Secondary | ICD-10-CM | POA: Diagnosis not present

## 2015-02-28 DIAGNOSIS — G309 Alzheimer's disease, unspecified: Secondary | ICD-10-CM | POA: Diagnosis not present

## 2015-02-28 DIAGNOSIS — F329 Major depressive disorder, single episode, unspecified: Secondary | ICD-10-CM | POA: Diagnosis not present

## 2015-02-28 DIAGNOSIS — M25552 Pain in left hip: Secondary | ICD-10-CM | POA: Diagnosis not present

## 2015-02-28 DIAGNOSIS — I10 Essential (primary) hypertension: Secondary | ICD-10-CM | POA: Diagnosis not present

## 2015-02-28 DIAGNOSIS — M353 Polymyalgia rheumatica: Secondary | ICD-10-CM | POA: Diagnosis not present

## 2015-03-01 DIAGNOSIS — G300 Alzheimer's disease with early onset: Secondary | ICD-10-CM | POA: Diagnosis not present

## 2015-03-02 DIAGNOSIS — G300 Alzheimer's disease with early onset: Secondary | ICD-10-CM | POA: Diagnosis not present

## 2015-03-03 DIAGNOSIS — G300 Alzheimer's disease with early onset: Secondary | ICD-10-CM | POA: Diagnosis not present

## 2015-03-04 DIAGNOSIS — M25551 Pain in right hip: Secondary | ICD-10-CM | POA: Diagnosis not present

## 2015-03-04 DIAGNOSIS — M353 Polymyalgia rheumatica: Secondary | ICD-10-CM | POA: Diagnosis not present

## 2015-03-04 DIAGNOSIS — I10 Essential (primary) hypertension: Secondary | ICD-10-CM | POA: Diagnosis not present

## 2015-03-04 DIAGNOSIS — G300 Alzheimer's disease with early onset: Secondary | ICD-10-CM | POA: Diagnosis not present

## 2015-03-04 DIAGNOSIS — M545 Low back pain: Secondary | ICD-10-CM | POA: Diagnosis not present

## 2015-03-04 DIAGNOSIS — F329 Major depressive disorder, single episode, unspecified: Secondary | ICD-10-CM | POA: Diagnosis not present

## 2015-03-04 DIAGNOSIS — G309 Alzheimer's disease, unspecified: Secondary | ICD-10-CM | POA: Diagnosis not present

## 2015-03-04 DIAGNOSIS — M25552 Pain in left hip: Secondary | ICD-10-CM | POA: Diagnosis not present

## 2015-03-04 DIAGNOSIS — M810 Age-related osteoporosis without current pathological fracture: Secondary | ICD-10-CM | POA: Diagnosis not present

## 2015-03-05 DIAGNOSIS — M25552 Pain in left hip: Secondary | ICD-10-CM | POA: Diagnosis not present

## 2015-03-05 DIAGNOSIS — I10 Essential (primary) hypertension: Secondary | ICD-10-CM | POA: Diagnosis not present

## 2015-03-05 DIAGNOSIS — G309 Alzheimer's disease, unspecified: Secondary | ICD-10-CM | POA: Diagnosis not present

## 2015-03-05 DIAGNOSIS — M545 Low back pain: Secondary | ICD-10-CM | POA: Diagnosis not present

## 2015-03-05 DIAGNOSIS — F329 Major depressive disorder, single episode, unspecified: Secondary | ICD-10-CM | POA: Diagnosis not present

## 2015-03-05 DIAGNOSIS — M810 Age-related osteoporosis without current pathological fracture: Secondary | ICD-10-CM | POA: Diagnosis not present

## 2015-03-05 DIAGNOSIS — M25551 Pain in right hip: Secondary | ICD-10-CM | POA: Diagnosis not present

## 2015-03-05 DIAGNOSIS — M353 Polymyalgia rheumatica: Secondary | ICD-10-CM | POA: Diagnosis not present

## 2015-03-05 DIAGNOSIS — G300 Alzheimer's disease with early onset: Secondary | ICD-10-CM | POA: Diagnosis not present

## 2015-03-06 DIAGNOSIS — G300 Alzheimer's disease with early onset: Secondary | ICD-10-CM | POA: Diagnosis not present

## 2015-03-07 DIAGNOSIS — I10 Essential (primary) hypertension: Secondary | ICD-10-CM | POA: Diagnosis not present

## 2015-03-07 DIAGNOSIS — G300 Alzheimer's disease with early onset: Secondary | ICD-10-CM | POA: Diagnosis not present

## 2015-03-07 DIAGNOSIS — G309 Alzheimer's disease, unspecified: Secondary | ICD-10-CM | POA: Diagnosis not present

## 2015-03-07 DIAGNOSIS — M353 Polymyalgia rheumatica: Secondary | ICD-10-CM | POA: Diagnosis not present

## 2015-03-07 DIAGNOSIS — M25552 Pain in left hip: Secondary | ICD-10-CM | POA: Diagnosis not present

## 2015-03-07 DIAGNOSIS — M25551 Pain in right hip: Secondary | ICD-10-CM | POA: Diagnosis not present

## 2015-03-07 DIAGNOSIS — M545 Low back pain: Secondary | ICD-10-CM | POA: Diagnosis not present

## 2015-03-07 DIAGNOSIS — M810 Age-related osteoporosis without current pathological fracture: Secondary | ICD-10-CM | POA: Diagnosis not present

## 2015-03-07 DIAGNOSIS — F329 Major depressive disorder, single episode, unspecified: Secondary | ICD-10-CM | POA: Diagnosis not present

## 2015-03-08 DIAGNOSIS — G300 Alzheimer's disease with early onset: Secondary | ICD-10-CM | POA: Diagnosis not present

## 2015-03-09 DIAGNOSIS — G300 Alzheimer's disease with early onset: Secondary | ICD-10-CM | POA: Diagnosis not present

## 2015-03-10 DIAGNOSIS — G300 Alzheimer's disease with early onset: Secondary | ICD-10-CM | POA: Diagnosis not present

## 2015-03-11 DIAGNOSIS — F329 Major depressive disorder, single episode, unspecified: Secondary | ICD-10-CM | POA: Diagnosis not present

## 2015-03-11 DIAGNOSIS — M353 Polymyalgia rheumatica: Secondary | ICD-10-CM | POA: Diagnosis not present

## 2015-03-11 DIAGNOSIS — G309 Alzheimer's disease, unspecified: Secondary | ICD-10-CM | POA: Diagnosis not present

## 2015-03-11 DIAGNOSIS — G300 Alzheimer's disease with early onset: Secondary | ICD-10-CM | POA: Diagnosis not present

## 2015-03-11 DIAGNOSIS — E6609 Other obesity due to excess calories: Secondary | ICD-10-CM | POA: Diagnosis not present

## 2015-03-11 DIAGNOSIS — F325 Major depressive disorder, single episode, in full remission: Secondary | ICD-10-CM | POA: Diagnosis not present

## 2015-03-11 DIAGNOSIS — M25552 Pain in left hip: Secondary | ICD-10-CM | POA: Diagnosis not present

## 2015-03-11 DIAGNOSIS — E782 Mixed hyperlipidemia: Secondary | ICD-10-CM | POA: Diagnosis not present

## 2015-03-11 DIAGNOSIS — G4733 Obstructive sleep apnea (adult) (pediatric): Secondary | ICD-10-CM | POA: Diagnosis not present

## 2015-03-11 DIAGNOSIS — M545 Low back pain: Secondary | ICD-10-CM | POA: Diagnosis not present

## 2015-03-11 DIAGNOSIS — I10 Essential (primary) hypertension: Secondary | ICD-10-CM | POA: Diagnosis not present

## 2015-03-11 DIAGNOSIS — M25551 Pain in right hip: Secondary | ICD-10-CM | POA: Diagnosis not present

## 2015-03-11 DIAGNOSIS — M810 Age-related osteoporosis without current pathological fracture: Secondary | ICD-10-CM | POA: Diagnosis not present

## 2015-03-11 DIAGNOSIS — E0580 Other thyrotoxicosis without thyrotoxic crisis or storm: Secondary | ICD-10-CM | POA: Diagnosis not present

## 2015-03-12 DIAGNOSIS — G300 Alzheimer's disease with early onset: Secondary | ICD-10-CM | POA: Diagnosis not present

## 2015-03-12 DIAGNOSIS — M353 Polymyalgia rheumatica: Secondary | ICD-10-CM | POA: Diagnosis not present

## 2015-03-12 DIAGNOSIS — M25551 Pain in right hip: Secondary | ICD-10-CM | POA: Diagnosis not present

## 2015-03-12 DIAGNOSIS — G309 Alzheimer's disease, unspecified: Secondary | ICD-10-CM | POA: Diagnosis not present

## 2015-03-12 DIAGNOSIS — M810 Age-related osteoporosis without current pathological fracture: Secondary | ICD-10-CM | POA: Diagnosis not present

## 2015-03-12 DIAGNOSIS — M25552 Pain in left hip: Secondary | ICD-10-CM | POA: Diagnosis not present

## 2015-03-12 DIAGNOSIS — M545 Low back pain: Secondary | ICD-10-CM | POA: Diagnosis not present

## 2015-03-12 DIAGNOSIS — I10 Essential (primary) hypertension: Secondary | ICD-10-CM | POA: Diagnosis not present

## 2015-03-12 DIAGNOSIS — F329 Major depressive disorder, single episode, unspecified: Secondary | ICD-10-CM | POA: Diagnosis not present

## 2015-03-13 DIAGNOSIS — G300 Alzheimer's disease with early onset: Secondary | ICD-10-CM | POA: Diagnosis not present

## 2015-03-14 DIAGNOSIS — M25551 Pain in right hip: Secondary | ICD-10-CM | POA: Diagnosis not present

## 2015-03-14 DIAGNOSIS — M810 Age-related osteoporosis without current pathological fracture: Secondary | ICD-10-CM | POA: Diagnosis not present

## 2015-03-14 DIAGNOSIS — G309 Alzheimer's disease, unspecified: Secondary | ICD-10-CM | POA: Diagnosis not present

## 2015-03-14 DIAGNOSIS — I10 Essential (primary) hypertension: Secondary | ICD-10-CM | POA: Diagnosis not present

## 2015-03-14 DIAGNOSIS — F329 Major depressive disorder, single episode, unspecified: Secondary | ICD-10-CM | POA: Diagnosis not present

## 2015-03-14 DIAGNOSIS — M353 Polymyalgia rheumatica: Secondary | ICD-10-CM | POA: Diagnosis not present

## 2015-03-14 DIAGNOSIS — M545 Low back pain: Secondary | ICD-10-CM | POA: Diagnosis not present

## 2015-03-14 DIAGNOSIS — M25552 Pain in left hip: Secondary | ICD-10-CM | POA: Diagnosis not present

## 2015-03-14 DIAGNOSIS — G300 Alzheimer's disease with early onset: Secondary | ICD-10-CM | POA: Diagnosis not present

## 2015-03-15 DIAGNOSIS — G300 Alzheimer's disease with early onset: Secondary | ICD-10-CM | POA: Diagnosis not present

## 2015-03-16 DIAGNOSIS — G300 Alzheimer's disease with early onset: Secondary | ICD-10-CM | POA: Diagnosis not present

## 2015-03-17 DIAGNOSIS — G300 Alzheimer's disease with early onset: Secondary | ICD-10-CM | POA: Diagnosis not present

## 2015-03-18 DIAGNOSIS — M25552 Pain in left hip: Secondary | ICD-10-CM | POA: Diagnosis not present

## 2015-03-18 DIAGNOSIS — E782 Mixed hyperlipidemia: Secondary | ICD-10-CM | POA: Diagnosis not present

## 2015-03-18 DIAGNOSIS — I10 Essential (primary) hypertension: Secondary | ICD-10-CM | POA: Diagnosis not present

## 2015-03-18 DIAGNOSIS — G4733 Obstructive sleep apnea (adult) (pediatric): Secondary | ICD-10-CM | POA: Diagnosis not present

## 2015-03-18 DIAGNOSIS — F325 Major depressive disorder, single episode, in full remission: Secondary | ICD-10-CM | POA: Diagnosis not present

## 2015-03-18 DIAGNOSIS — E6609 Other obesity due to excess calories: Secondary | ICD-10-CM | POA: Diagnosis not present

## 2015-03-18 DIAGNOSIS — J301 Allergic rhinitis due to pollen: Secondary | ICD-10-CM | POA: Diagnosis not present

## 2015-03-18 DIAGNOSIS — M810 Age-related osteoporosis without current pathological fracture: Secondary | ICD-10-CM | POA: Diagnosis not present

## 2015-03-18 DIAGNOSIS — M353 Polymyalgia rheumatica: Secondary | ICD-10-CM | POA: Diagnosis not present

## 2015-03-18 DIAGNOSIS — G309 Alzheimer's disease, unspecified: Secondary | ICD-10-CM | POA: Diagnosis not present

## 2015-03-18 DIAGNOSIS — F329 Major depressive disorder, single episode, unspecified: Secondary | ICD-10-CM | POA: Diagnosis not present

## 2015-03-18 DIAGNOSIS — M545 Low back pain: Secondary | ICD-10-CM | POA: Diagnosis not present

## 2015-03-18 DIAGNOSIS — G300 Alzheimer's disease with early onset: Secondary | ICD-10-CM | POA: Diagnosis not present

## 2015-03-18 DIAGNOSIS — M25551 Pain in right hip: Secondary | ICD-10-CM | POA: Diagnosis not present

## 2015-03-18 DIAGNOSIS — E0580 Other thyrotoxicosis without thyrotoxic crisis or storm: Secondary | ICD-10-CM | POA: Diagnosis not present

## 2015-03-19 DIAGNOSIS — G300 Alzheimer's disease with early onset: Secondary | ICD-10-CM | POA: Diagnosis not present

## 2015-03-20 DIAGNOSIS — F329 Major depressive disorder, single episode, unspecified: Secondary | ICD-10-CM | POA: Diagnosis not present

## 2015-03-20 DIAGNOSIS — M353 Polymyalgia rheumatica: Secondary | ICD-10-CM | POA: Diagnosis not present

## 2015-03-20 DIAGNOSIS — M545 Low back pain: Secondary | ICD-10-CM | POA: Diagnosis not present

## 2015-03-20 DIAGNOSIS — M25551 Pain in right hip: Secondary | ICD-10-CM | POA: Diagnosis not present

## 2015-03-20 DIAGNOSIS — I10 Essential (primary) hypertension: Secondary | ICD-10-CM | POA: Diagnosis not present

## 2015-03-20 DIAGNOSIS — M25552 Pain in left hip: Secondary | ICD-10-CM | POA: Diagnosis not present

## 2015-03-20 DIAGNOSIS — G309 Alzheimer's disease, unspecified: Secondary | ICD-10-CM | POA: Diagnosis not present

## 2015-03-20 DIAGNOSIS — M810 Age-related osteoporosis without current pathological fracture: Secondary | ICD-10-CM | POA: Diagnosis not present

## 2015-03-20 DIAGNOSIS — G300 Alzheimer's disease with early onset: Secondary | ICD-10-CM | POA: Diagnosis not present

## 2015-03-21 DIAGNOSIS — G300 Alzheimer's disease with early onset: Secondary | ICD-10-CM | POA: Diagnosis not present

## 2015-03-21 DIAGNOSIS — M25551 Pain in right hip: Secondary | ICD-10-CM | POA: Diagnosis not present

## 2015-03-21 DIAGNOSIS — F329 Major depressive disorder, single episode, unspecified: Secondary | ICD-10-CM | POA: Diagnosis not present

## 2015-03-21 DIAGNOSIS — M25552 Pain in left hip: Secondary | ICD-10-CM | POA: Diagnosis not present

## 2015-03-21 DIAGNOSIS — M810 Age-related osteoporosis without current pathological fracture: Secondary | ICD-10-CM | POA: Diagnosis not present

## 2015-03-21 DIAGNOSIS — I10 Essential (primary) hypertension: Secondary | ICD-10-CM | POA: Diagnosis not present

## 2015-03-21 DIAGNOSIS — M545 Low back pain: Secondary | ICD-10-CM | POA: Diagnosis not present

## 2015-03-21 DIAGNOSIS — G309 Alzheimer's disease, unspecified: Secondary | ICD-10-CM | POA: Diagnosis not present

## 2015-03-21 DIAGNOSIS — M353 Polymyalgia rheumatica: Secondary | ICD-10-CM | POA: Diagnosis not present

## 2015-03-22 DIAGNOSIS — G300 Alzheimer's disease with early onset: Secondary | ICD-10-CM | POA: Diagnosis not present

## 2015-03-23 DIAGNOSIS — G300 Alzheimer's disease with early onset: Secondary | ICD-10-CM | POA: Diagnosis not present

## 2015-03-24 DIAGNOSIS — G300 Alzheimer's disease with early onset: Secondary | ICD-10-CM | POA: Diagnosis not present

## 2015-03-25 DIAGNOSIS — M545 Low back pain: Secondary | ICD-10-CM | POA: Diagnosis not present

## 2015-03-25 DIAGNOSIS — I10 Essential (primary) hypertension: Secondary | ICD-10-CM | POA: Diagnosis not present

## 2015-03-25 DIAGNOSIS — M353 Polymyalgia rheumatica: Secondary | ICD-10-CM | POA: Diagnosis not present

## 2015-03-25 DIAGNOSIS — G300 Alzheimer's disease with early onset: Secondary | ICD-10-CM | POA: Diagnosis not present

## 2015-03-25 DIAGNOSIS — M25552 Pain in left hip: Secondary | ICD-10-CM | POA: Diagnosis not present

## 2015-03-25 DIAGNOSIS — M810 Age-related osteoporosis without current pathological fracture: Secondary | ICD-10-CM | POA: Diagnosis not present

## 2015-03-25 DIAGNOSIS — G309 Alzheimer's disease, unspecified: Secondary | ICD-10-CM | POA: Diagnosis not present

## 2015-03-25 DIAGNOSIS — F329 Major depressive disorder, single episode, unspecified: Secondary | ICD-10-CM | POA: Diagnosis not present

## 2015-03-25 DIAGNOSIS — M25551 Pain in right hip: Secondary | ICD-10-CM | POA: Diagnosis not present

## 2015-03-26 DIAGNOSIS — G300 Alzheimer's disease with early onset: Secondary | ICD-10-CM | POA: Diagnosis not present

## 2015-03-27 DIAGNOSIS — G309 Alzheimer's disease, unspecified: Secondary | ICD-10-CM | POA: Diagnosis not present

## 2015-03-27 DIAGNOSIS — I10 Essential (primary) hypertension: Secondary | ICD-10-CM | POA: Diagnosis not present

## 2015-03-27 DIAGNOSIS — M25552 Pain in left hip: Secondary | ICD-10-CM | POA: Diagnosis not present

## 2015-03-27 DIAGNOSIS — M545 Low back pain: Secondary | ICD-10-CM | POA: Diagnosis not present

## 2015-03-27 DIAGNOSIS — F329 Major depressive disorder, single episode, unspecified: Secondary | ICD-10-CM | POA: Diagnosis not present

## 2015-03-27 DIAGNOSIS — M25551 Pain in right hip: Secondary | ICD-10-CM | POA: Diagnosis not present

## 2015-03-27 DIAGNOSIS — G300 Alzheimer's disease with early onset: Secondary | ICD-10-CM | POA: Diagnosis not present

## 2015-03-27 DIAGNOSIS — M810 Age-related osteoporosis without current pathological fracture: Secondary | ICD-10-CM | POA: Diagnosis not present

## 2015-03-27 DIAGNOSIS — M353 Polymyalgia rheumatica: Secondary | ICD-10-CM | POA: Diagnosis not present

## 2015-03-28 DIAGNOSIS — G300 Alzheimer's disease with early onset: Secondary | ICD-10-CM | POA: Diagnosis not present

## 2015-03-29 DIAGNOSIS — G300 Alzheimer's disease with early onset: Secondary | ICD-10-CM | POA: Diagnosis not present

## 2015-03-30 DIAGNOSIS — G300 Alzheimer's disease with early onset: Secondary | ICD-10-CM | POA: Diagnosis not present

## 2015-03-31 DIAGNOSIS — G300 Alzheimer's disease with early onset: Secondary | ICD-10-CM | POA: Diagnosis not present

## 2015-04-01 DIAGNOSIS — M810 Age-related osteoporosis without current pathological fracture: Secondary | ICD-10-CM | POA: Diagnosis not present

## 2015-04-01 DIAGNOSIS — G300 Alzheimer's disease with early onset: Secondary | ICD-10-CM | POA: Diagnosis not present

## 2015-04-01 DIAGNOSIS — M25552 Pain in left hip: Secondary | ICD-10-CM | POA: Diagnosis not present

## 2015-04-01 DIAGNOSIS — G309 Alzheimer's disease, unspecified: Secondary | ICD-10-CM | POA: Diagnosis not present

## 2015-04-01 DIAGNOSIS — M353 Polymyalgia rheumatica: Secondary | ICD-10-CM | POA: Diagnosis not present

## 2015-04-01 DIAGNOSIS — M545 Low back pain: Secondary | ICD-10-CM | POA: Diagnosis not present

## 2015-04-01 DIAGNOSIS — I10 Essential (primary) hypertension: Secondary | ICD-10-CM | POA: Diagnosis not present

## 2015-04-01 DIAGNOSIS — M25551 Pain in right hip: Secondary | ICD-10-CM | POA: Diagnosis not present

## 2015-04-01 DIAGNOSIS — F329 Major depressive disorder, single episode, unspecified: Secondary | ICD-10-CM | POA: Diagnosis not present

## 2015-04-02 DIAGNOSIS — I10 Essential (primary) hypertension: Secondary | ICD-10-CM | POA: Diagnosis not present

## 2015-04-02 DIAGNOSIS — M353 Polymyalgia rheumatica: Secondary | ICD-10-CM | POA: Diagnosis not present

## 2015-04-02 DIAGNOSIS — M545 Low back pain: Secondary | ICD-10-CM | POA: Diagnosis not present

## 2015-04-02 DIAGNOSIS — G309 Alzheimer's disease, unspecified: Secondary | ICD-10-CM | POA: Diagnosis not present

## 2015-04-02 DIAGNOSIS — M25552 Pain in left hip: Secondary | ICD-10-CM | POA: Diagnosis not present

## 2015-04-02 DIAGNOSIS — M25551 Pain in right hip: Secondary | ICD-10-CM | POA: Diagnosis not present

## 2015-04-02 DIAGNOSIS — M810 Age-related osteoporosis without current pathological fracture: Secondary | ICD-10-CM | POA: Diagnosis not present

## 2015-04-02 DIAGNOSIS — F329 Major depressive disorder, single episode, unspecified: Secondary | ICD-10-CM | POA: Diagnosis not present

## 2015-04-02 DIAGNOSIS — G300 Alzheimer's disease with early onset: Secondary | ICD-10-CM | POA: Diagnosis not present

## 2015-04-03 DIAGNOSIS — G300 Alzheimer's disease with early onset: Secondary | ICD-10-CM | POA: Diagnosis not present

## 2015-04-04 DIAGNOSIS — G309 Alzheimer's disease, unspecified: Secondary | ICD-10-CM | POA: Diagnosis not present

## 2015-04-04 DIAGNOSIS — I10 Essential (primary) hypertension: Secondary | ICD-10-CM | POA: Diagnosis not present

## 2015-04-04 DIAGNOSIS — M810 Age-related osteoporosis without current pathological fracture: Secondary | ICD-10-CM | POA: Diagnosis not present

## 2015-04-04 DIAGNOSIS — M25551 Pain in right hip: Secondary | ICD-10-CM | POA: Diagnosis not present

## 2015-04-04 DIAGNOSIS — M545 Low back pain: Secondary | ICD-10-CM | POA: Diagnosis not present

## 2015-04-04 DIAGNOSIS — F329 Major depressive disorder, single episode, unspecified: Secondary | ICD-10-CM | POA: Diagnosis not present

## 2015-04-04 DIAGNOSIS — M25552 Pain in left hip: Secondary | ICD-10-CM | POA: Diagnosis not present

## 2015-04-04 DIAGNOSIS — M353 Polymyalgia rheumatica: Secondary | ICD-10-CM | POA: Diagnosis not present

## 2015-04-04 DIAGNOSIS — G300 Alzheimer's disease with early onset: Secondary | ICD-10-CM | POA: Diagnosis not present

## 2015-04-05 DIAGNOSIS — G300 Alzheimer's disease with early onset: Secondary | ICD-10-CM | POA: Diagnosis not present

## 2015-04-06 DIAGNOSIS — G300 Alzheimer's disease with early onset: Secondary | ICD-10-CM | POA: Diagnosis not present

## 2015-04-07 DIAGNOSIS — G300 Alzheimer's disease with early onset: Secondary | ICD-10-CM | POA: Diagnosis not present

## 2015-04-08 DIAGNOSIS — G309 Alzheimer's disease, unspecified: Secondary | ICD-10-CM | POA: Diagnosis not present

## 2015-04-08 DIAGNOSIS — M353 Polymyalgia rheumatica: Secondary | ICD-10-CM | POA: Diagnosis not present

## 2015-04-08 DIAGNOSIS — G300 Alzheimer's disease with early onset: Secondary | ICD-10-CM | POA: Diagnosis not present

## 2015-04-08 DIAGNOSIS — M810 Age-related osteoporosis without current pathological fracture: Secondary | ICD-10-CM | POA: Diagnosis not present

## 2015-04-08 DIAGNOSIS — M25551 Pain in right hip: Secondary | ICD-10-CM | POA: Diagnosis not present

## 2015-04-08 DIAGNOSIS — I10 Essential (primary) hypertension: Secondary | ICD-10-CM | POA: Diagnosis not present

## 2015-04-08 DIAGNOSIS — F329 Major depressive disorder, single episode, unspecified: Secondary | ICD-10-CM | POA: Diagnosis not present

## 2015-04-08 DIAGNOSIS — M545 Low back pain: Secondary | ICD-10-CM | POA: Diagnosis not present

## 2015-04-08 DIAGNOSIS — M25552 Pain in left hip: Secondary | ICD-10-CM | POA: Diagnosis not present

## 2015-04-09 DIAGNOSIS — M25551 Pain in right hip: Secondary | ICD-10-CM | POA: Diagnosis not present

## 2015-04-09 DIAGNOSIS — M810 Age-related osteoporosis without current pathological fracture: Secondary | ICD-10-CM | POA: Diagnosis not present

## 2015-04-09 DIAGNOSIS — G300 Alzheimer's disease with early onset: Secondary | ICD-10-CM | POA: Diagnosis not present

## 2015-04-09 DIAGNOSIS — F329 Major depressive disorder, single episode, unspecified: Secondary | ICD-10-CM | POA: Diagnosis not present

## 2015-04-09 DIAGNOSIS — M25552 Pain in left hip: Secondary | ICD-10-CM | POA: Diagnosis not present

## 2015-04-09 DIAGNOSIS — M353 Polymyalgia rheumatica: Secondary | ICD-10-CM | POA: Diagnosis not present

## 2015-04-09 DIAGNOSIS — G309 Alzheimer's disease, unspecified: Secondary | ICD-10-CM | POA: Diagnosis not present

## 2015-04-09 DIAGNOSIS — I10 Essential (primary) hypertension: Secondary | ICD-10-CM | POA: Diagnosis not present

## 2015-04-09 DIAGNOSIS — M545 Low back pain: Secondary | ICD-10-CM | POA: Diagnosis not present

## 2015-04-10 DIAGNOSIS — G300 Alzheimer's disease with early onset: Secondary | ICD-10-CM | POA: Diagnosis not present

## 2015-04-11 DIAGNOSIS — F329 Major depressive disorder, single episode, unspecified: Secondary | ICD-10-CM | POA: Diagnosis not present

## 2015-04-11 DIAGNOSIS — M810 Age-related osteoporosis without current pathological fracture: Secondary | ICD-10-CM | POA: Diagnosis not present

## 2015-04-11 DIAGNOSIS — M545 Low back pain: Secondary | ICD-10-CM | POA: Diagnosis not present

## 2015-04-11 DIAGNOSIS — G309 Alzheimer's disease, unspecified: Secondary | ICD-10-CM | POA: Diagnosis not present

## 2015-04-11 DIAGNOSIS — G300 Alzheimer's disease with early onset: Secondary | ICD-10-CM | POA: Diagnosis not present

## 2015-04-11 DIAGNOSIS — M25552 Pain in left hip: Secondary | ICD-10-CM | POA: Diagnosis not present

## 2015-04-11 DIAGNOSIS — M353 Polymyalgia rheumatica: Secondary | ICD-10-CM | POA: Diagnosis not present

## 2015-04-11 DIAGNOSIS — M25551 Pain in right hip: Secondary | ICD-10-CM | POA: Diagnosis not present

## 2015-04-11 DIAGNOSIS — I10 Essential (primary) hypertension: Secondary | ICD-10-CM | POA: Diagnosis not present

## 2015-04-12 DIAGNOSIS — G300 Alzheimer's disease with early onset: Secondary | ICD-10-CM | POA: Diagnosis not present

## 2015-04-12 DIAGNOSIS — M25551 Pain in right hip: Secondary | ICD-10-CM | POA: Diagnosis not present

## 2015-04-12 DIAGNOSIS — I10 Essential (primary) hypertension: Secondary | ICD-10-CM | POA: Diagnosis not present

## 2015-04-12 DIAGNOSIS — M810 Age-related osteoporosis without current pathological fracture: Secondary | ICD-10-CM | POA: Diagnosis not present

## 2015-04-12 DIAGNOSIS — F329 Major depressive disorder, single episode, unspecified: Secondary | ICD-10-CM | POA: Diagnosis not present

## 2015-04-12 DIAGNOSIS — M545 Low back pain: Secondary | ICD-10-CM | POA: Diagnosis not present

## 2015-04-12 DIAGNOSIS — G309 Alzheimer's disease, unspecified: Secondary | ICD-10-CM | POA: Diagnosis not present

## 2015-04-12 DIAGNOSIS — M25552 Pain in left hip: Secondary | ICD-10-CM | POA: Diagnosis not present

## 2015-04-12 DIAGNOSIS — M353 Polymyalgia rheumatica: Secondary | ICD-10-CM | POA: Diagnosis not present

## 2015-04-13 DIAGNOSIS — G300 Alzheimer's disease with early onset: Secondary | ICD-10-CM | POA: Diagnosis not present

## 2015-04-14 DIAGNOSIS — G300 Alzheimer's disease with early onset: Secondary | ICD-10-CM | POA: Diagnosis not present

## 2015-04-15 DIAGNOSIS — G300 Alzheimer's disease with early onset: Secondary | ICD-10-CM | POA: Diagnosis not present

## 2015-04-16 DIAGNOSIS — M810 Age-related osteoporosis without current pathological fracture: Secondary | ICD-10-CM | POA: Diagnosis not present

## 2015-04-16 DIAGNOSIS — I10 Essential (primary) hypertension: Secondary | ICD-10-CM | POA: Diagnosis not present

## 2015-04-16 DIAGNOSIS — M25551 Pain in right hip: Secondary | ICD-10-CM | POA: Diagnosis not present

## 2015-04-16 DIAGNOSIS — F329 Major depressive disorder, single episode, unspecified: Secondary | ICD-10-CM | POA: Diagnosis not present

## 2015-04-16 DIAGNOSIS — M25552 Pain in left hip: Secondary | ICD-10-CM | POA: Diagnosis not present

## 2015-04-16 DIAGNOSIS — M353 Polymyalgia rheumatica: Secondary | ICD-10-CM | POA: Diagnosis not present

## 2015-04-16 DIAGNOSIS — G309 Alzheimer's disease, unspecified: Secondary | ICD-10-CM | POA: Diagnosis not present

## 2015-04-16 DIAGNOSIS — G300 Alzheimer's disease with early onset: Secondary | ICD-10-CM | POA: Diagnosis not present

## 2015-04-16 DIAGNOSIS — M545 Low back pain: Secondary | ICD-10-CM | POA: Diagnosis not present

## 2015-04-17 DIAGNOSIS — G300 Alzheimer's disease with early onset: Secondary | ICD-10-CM | POA: Diagnosis not present

## 2015-04-18 DIAGNOSIS — M353 Polymyalgia rheumatica: Secondary | ICD-10-CM | POA: Diagnosis not present

## 2015-04-18 DIAGNOSIS — M25551 Pain in right hip: Secondary | ICD-10-CM | POA: Diagnosis not present

## 2015-04-18 DIAGNOSIS — M25552 Pain in left hip: Secondary | ICD-10-CM | POA: Diagnosis not present

## 2015-04-18 DIAGNOSIS — M810 Age-related osteoporosis without current pathological fracture: Secondary | ICD-10-CM | POA: Diagnosis not present

## 2015-04-18 DIAGNOSIS — G309 Alzheimer's disease, unspecified: Secondary | ICD-10-CM | POA: Diagnosis not present

## 2015-04-18 DIAGNOSIS — I10 Essential (primary) hypertension: Secondary | ICD-10-CM | POA: Diagnosis not present

## 2015-04-18 DIAGNOSIS — G300 Alzheimer's disease with early onset: Secondary | ICD-10-CM | POA: Diagnosis not present

## 2015-04-18 DIAGNOSIS — F329 Major depressive disorder, single episode, unspecified: Secondary | ICD-10-CM | POA: Diagnosis not present

## 2015-04-18 DIAGNOSIS — M545 Low back pain: Secondary | ICD-10-CM | POA: Diagnosis not present

## 2015-04-19 DIAGNOSIS — G300 Alzheimer's disease with early onset: Secondary | ICD-10-CM | POA: Diagnosis not present

## 2015-04-20 DIAGNOSIS — G300 Alzheimer's disease with early onset: Secondary | ICD-10-CM | POA: Diagnosis not present

## 2015-04-21 DIAGNOSIS — G300 Alzheimer's disease with early onset: Secondary | ICD-10-CM | POA: Diagnosis not present

## 2015-04-22 DIAGNOSIS — G300 Alzheimer's disease with early onset: Secondary | ICD-10-CM | POA: Diagnosis not present

## 2015-04-23 DIAGNOSIS — G300 Alzheimer's disease with early onset: Secondary | ICD-10-CM | POA: Diagnosis not present

## 2015-04-24 DIAGNOSIS — H3531 Nonexudative age-related macular degeneration: Secondary | ICD-10-CM | POA: Diagnosis not present

## 2015-04-24 DIAGNOSIS — G300 Alzheimer's disease with early onset: Secondary | ICD-10-CM | POA: Diagnosis not present

## 2015-04-24 DIAGNOSIS — Z0100 Encounter for examination of eyes and vision without abnormal findings: Secondary | ICD-10-CM | POA: Diagnosis not present

## 2015-04-25 DIAGNOSIS — G300 Alzheimer's disease with early onset: Secondary | ICD-10-CM | POA: Diagnosis not present

## 2015-04-26 DIAGNOSIS — G300 Alzheimer's disease with early onset: Secondary | ICD-10-CM | POA: Diagnosis not present

## 2015-04-27 DIAGNOSIS — G300 Alzheimer's disease with early onset: Secondary | ICD-10-CM | POA: Diagnosis not present

## 2015-04-28 DIAGNOSIS — G300 Alzheimer's disease with early onset: Secondary | ICD-10-CM | POA: Diagnosis not present

## 2015-04-29 DIAGNOSIS — G300 Alzheimer's disease with early onset: Secondary | ICD-10-CM | POA: Diagnosis not present

## 2015-04-30 DIAGNOSIS — G300 Alzheimer's disease with early onset: Secondary | ICD-10-CM | POA: Diagnosis not present

## 2015-05-01 DIAGNOSIS — G300 Alzheimer's disease with early onset: Secondary | ICD-10-CM | POA: Diagnosis not present

## 2015-05-02 DIAGNOSIS — G300 Alzheimer's disease with early onset: Secondary | ICD-10-CM | POA: Diagnosis not present

## 2015-05-03 DIAGNOSIS — G300 Alzheimer's disease with early onset: Secondary | ICD-10-CM | POA: Diagnosis not present

## 2015-05-04 DIAGNOSIS — G300 Alzheimer's disease with early onset: Secondary | ICD-10-CM | POA: Diagnosis not present

## 2015-05-05 DIAGNOSIS — G300 Alzheimer's disease with early onset: Secondary | ICD-10-CM | POA: Diagnosis not present

## 2015-05-06 DIAGNOSIS — G300 Alzheimer's disease with early onset: Secondary | ICD-10-CM | POA: Diagnosis not present

## 2015-05-07 DIAGNOSIS — G300 Alzheimer's disease with early onset: Secondary | ICD-10-CM | POA: Diagnosis not present

## 2015-05-08 DIAGNOSIS — G300 Alzheimer's disease with early onset: Secondary | ICD-10-CM | POA: Diagnosis not present

## 2015-05-09 DIAGNOSIS — G300 Alzheimer's disease with early onset: Secondary | ICD-10-CM | POA: Diagnosis not present

## 2015-05-10 DIAGNOSIS — G300 Alzheimer's disease with early onset: Secondary | ICD-10-CM | POA: Diagnosis not present

## 2015-05-11 DIAGNOSIS — G300 Alzheimer's disease with early onset: Secondary | ICD-10-CM | POA: Diagnosis not present

## 2015-05-12 DIAGNOSIS — G300 Alzheimer's disease with early onset: Secondary | ICD-10-CM | POA: Diagnosis not present

## 2015-05-13 DIAGNOSIS — G300 Alzheimer's disease with early onset: Secondary | ICD-10-CM | POA: Diagnosis not present

## 2015-05-14 DIAGNOSIS — G300 Alzheimer's disease with early onset: Secondary | ICD-10-CM | POA: Diagnosis not present

## 2015-05-15 DIAGNOSIS — G300 Alzheimer's disease with early onset: Secondary | ICD-10-CM | POA: Diagnosis not present

## 2015-05-16 DIAGNOSIS — G300 Alzheimer's disease with early onset: Secondary | ICD-10-CM | POA: Diagnosis not present

## 2015-05-17 DIAGNOSIS — G300 Alzheimer's disease with early onset: Secondary | ICD-10-CM | POA: Diagnosis not present

## 2015-05-18 DIAGNOSIS — G300 Alzheimer's disease with early onset: Secondary | ICD-10-CM | POA: Diagnosis not present

## 2015-05-19 DIAGNOSIS — G300 Alzheimer's disease with early onset: Secondary | ICD-10-CM | POA: Diagnosis not present

## 2015-05-20 DIAGNOSIS — G300 Alzheimer's disease with early onset: Secondary | ICD-10-CM | POA: Diagnosis not present

## 2015-05-21 DIAGNOSIS — G300 Alzheimer's disease with early onset: Secondary | ICD-10-CM | POA: Diagnosis not present

## 2015-05-22 DIAGNOSIS — G300 Alzheimer's disease with early onset: Secondary | ICD-10-CM | POA: Diagnosis not present

## 2015-05-23 DIAGNOSIS — G300 Alzheimer's disease with early onset: Secondary | ICD-10-CM | POA: Diagnosis not present

## 2015-05-24 DIAGNOSIS — G300 Alzheimer's disease with early onset: Secondary | ICD-10-CM | POA: Diagnosis not present

## 2015-05-25 ENCOUNTER — Emergency Department (HOSPITAL_COMMUNITY): Payer: Commercial Managed Care - HMO

## 2015-05-25 ENCOUNTER — Encounter (HOSPITAL_COMMUNITY): Payer: Self-pay | Admitting: Cardiology

## 2015-05-25 ENCOUNTER — Emergency Department (HOSPITAL_COMMUNITY)
Admission: EM | Admit: 2015-05-25 | Discharge: 2015-05-25 | Disposition: A | Discharge status: Home / Self Care | Payer: Commercial Managed Care - HMO | Attending: Emergency Medicine | Admitting: Emergency Medicine

## 2015-05-25 DIAGNOSIS — S79912A Unspecified injury of left hip, initial encounter: Secondary | ICD-10-CM | POA: Diagnosis not present

## 2015-05-25 DIAGNOSIS — R011 Cardiac murmur, unspecified: Secondary | ICD-10-CM | POA: Diagnosis not present

## 2015-05-25 DIAGNOSIS — I10 Essential (primary) hypertension: Secondary | ICD-10-CM | POA: Insufficient documentation

## 2015-05-25 DIAGNOSIS — Y92129 Unspecified place in nursing home as the place of occurrence of the external cause: Secondary | ICD-10-CM | POA: Diagnosis not present

## 2015-05-25 DIAGNOSIS — Z79899 Other long term (current) drug therapy: Secondary | ICD-10-CM | POA: Diagnosis not present

## 2015-05-25 DIAGNOSIS — Z880 Allergy status to penicillin: Secondary | ICD-10-CM | POA: Diagnosis not present

## 2015-05-25 DIAGNOSIS — Z85828 Personal history of other malignant neoplasm of skin: Secondary | ICD-10-CM | POA: Insufficient documentation

## 2015-05-25 DIAGNOSIS — M25559 Pain in unspecified hip: Secondary | ICD-10-CM

## 2015-05-25 DIAGNOSIS — S299XXA Unspecified injury of thorax, initial encounter: Secondary | ICD-10-CM | POA: Diagnosis not present

## 2015-05-25 DIAGNOSIS — Y998 Other external cause status: Secondary | ICD-10-CM | POA: Diagnosis not present

## 2015-05-25 DIAGNOSIS — S3992XA Unspecified injury of lower back, initial encounter: Secondary | ICD-10-CM | POA: Diagnosis not present

## 2015-05-25 DIAGNOSIS — Y9389 Activity, other specified: Secondary | ICD-10-CM | POA: Diagnosis not present

## 2015-05-25 DIAGNOSIS — Z8701 Personal history of pneumonia (recurrent): Secondary | ICD-10-CM | POA: Diagnosis not present

## 2015-05-25 DIAGNOSIS — S79911A Unspecified injury of right hip, initial encounter: Secondary | ICD-10-CM | POA: Diagnosis not present

## 2015-05-25 DIAGNOSIS — F419 Anxiety disorder, unspecified: Secondary | ICD-10-CM | POA: Insufficient documentation

## 2015-05-25 DIAGNOSIS — S4991XA Unspecified injury of right shoulder and upper arm, initial encounter: Secondary | ICD-10-CM | POA: Insufficient documentation

## 2015-05-25 DIAGNOSIS — R51 Headache: Secondary | ICD-10-CM | POA: Diagnosis not present

## 2015-05-25 DIAGNOSIS — W01198A Fall on same level from slipping, tripping and stumbling with subsequent striking against other object, initial encounter: Secondary | ICD-10-CM | POA: Diagnosis not present

## 2015-05-25 DIAGNOSIS — S0990XA Unspecified injury of head, initial encounter: Secondary | ICD-10-CM | POA: Insufficient documentation

## 2015-05-25 DIAGNOSIS — F329 Major depressive disorder, single episode, unspecified: Secondary | ICD-10-CM | POA: Diagnosis not present

## 2015-05-25 DIAGNOSIS — M1990 Unspecified osteoarthritis, unspecified site: Secondary | ICD-10-CM | POA: Insufficient documentation

## 2015-05-25 DIAGNOSIS — W19XXXA Unspecified fall, initial encounter: Secondary | ICD-10-CM

## 2015-05-25 DIAGNOSIS — M25552 Pain in left hip: Secondary | ICD-10-CM | POA: Diagnosis not present

## 2015-05-25 DIAGNOSIS — R0902 Hypoxemia: Secondary | ICD-10-CM | POA: Diagnosis not present

## 2015-05-25 DIAGNOSIS — G300 Alzheimer's disease with early onset: Secondary | ICD-10-CM | POA: Insufficient documentation

## 2015-05-25 DIAGNOSIS — M545 Low back pain: Secondary | ICD-10-CM | POA: Diagnosis not present

## 2015-05-25 DIAGNOSIS — Z8669 Personal history of other diseases of the nervous system and sense organs: Secondary | ICD-10-CM | POA: Insufficient documentation

## 2015-05-25 DIAGNOSIS — M25511 Pain in right shoulder: Secondary | ICD-10-CM | POA: Diagnosis not present

## 2015-05-25 DIAGNOSIS — M542 Cervicalgia: Secondary | ICD-10-CM | POA: Diagnosis not present

## 2015-05-25 DIAGNOSIS — Z8719 Personal history of other diseases of the digestive system: Secondary | ICD-10-CM | POA: Insufficient documentation

## 2015-05-25 DIAGNOSIS — S199XXA Unspecified injury of neck, initial encounter: Secondary | ICD-10-CM | POA: Insufficient documentation

## 2015-05-25 DIAGNOSIS — M25551 Pain in right hip: Secondary | ICD-10-CM | POA: Diagnosis not present

## 2015-05-25 HISTORY — DX: Major depressive disorder, single episode, unspecified: F32.9

## 2015-05-25 HISTORY — DX: Depression, unspecified: F32.A

## 2015-05-25 NOTE — Discharge Instructions (Signed)
°Emergency Department Resource Guide °1) Find a Doctor and Pay Out of Pocket °Although you won't have to find out who is covered by your insurance plan, it is a good idea to ask around and get recommendations. You will then need to call the office and see if the doctor you have chosen will accept you as a new patient and what types of options they offer for patients who are self-pay. Some doctors offer discounts or will set up payment plans for their patients who do not have insurance, but you will need to ask so you aren't surprised when you get to your appointment. ° °2) Contact Your Local Health Department °Not all health departments have doctors that can see patients for sick visits, but many do, so it is worth a call to see if yours does. If you don't know where your local health department is, you can check in your phone book. The CDC also has a tool to help you locate your state's health department, and many state websites also have listings of all of their local health departments. ° °3) Find a Walk-in Clinic °If your illness is not likely to be very severe or complicated, you may want to try a walk in clinic. These are popping up all over the country in pharmacies, drugstores, and shopping centers. They're usually staffed by nurse practitioners or physician assistants that have been trained to treat common illnesses and complaints. They're usually fairly quick and inexpensive. However, if you have serious medical issues or chronic medical problems, these are probably not your best option. ° °No Primary Care Doctor: °- Call [facility] at  832-8000 - they can help you locate a primary care doctor that  accepts your insurance, provides certain services, etc. °- Physician Referral Service- 1-800-533-3463 ° °Chronic Pain Problems: °Organization         Address  Phone   Notes  °[facility]  (336) 297-2271 Patients need to be referred by their primary care doctor.  ° °Medication  Assistance: °Organization         Address  Phone   Notes  °[place] [facility] [address] °[place], [place] [address] (336) 641-8030 --Must be a resident of [place] °-- Must have NO insurance coverage whatsoever (no Medicaid/ Medicare, etc.) °-- The pt. MUST have a primary care doctor that directs their care regularly and follows them in the community °  °[facility]  (866) 331-1348   °[employer]  (888) 892-1162   ° °Agencies that provide inexpensive medical care: °Organization         Address  Phone   Notes  °[facility]  (336) 832-8035   °[facility]    (336) 832-7272   °[facility] [address] °[place], [place] [address] (336) 832-4777   °[facility] [address], °[place] (336) 271-4999   °[facility]    (336) 373-0678   °[facility]    (336) 272-1050   °[facility] ° [address], [place] Phone:  (336) 832-4444, Fax:  (336) 832-4440 Hours of Operation:  9 am - 6 pm, M-F.  Also accepts Medicaid/Medicare and self-pay.  °[facility] ° [address],  Phone: (336) 832-3150, Fax: (336) 832-3151. Hours of Operation:  8:30 am - 5:30 pm, M-F.  Also accepts Medicaid and self-pay.  °[facility] [place] [address], [place] Phone: (336) 878-6027   °[facility] [address], [place], [place] (336)723-1848, Ext. 123 Mondays & Thursdays: 7-9 AM.  First 15 patients are seen on a first come, first serve basis. °  ° °Medicaid-accepting [place] Providers: ° °Organization         Address  Phone   Notes  °[facility] [address], [place] (336) 641-2100 Also accepts self-pay patients.  °[facility] [address], [place] ° (336) 856-9996   °[facility] [address], [place]  (336) 288-8857   °[facility] [address] [place] [address], [place] (336) 299-7000   °Veita Bland [address], [place]  ° (336) 373-1557 Only accepts [place] Access Medicaid patients after they have their name applied to their card.  ° °Self-Pay (no insurance) in [place]: ° °Organization         Address  Phone   Notes  °Sickle Cell Patients, [facility] [address], [place] (336) 832-1970   °[facility] [address], [place] (336) 832-4400   °[facility] ° [address], [place] (336) 992-4800   °[facility]/Dr. Osei-Bonsu ° [address] [place] [address], [place] or [address], [place] (336) 841-8500 Phone number for both [place] and [place] locations is the same.  °[facility] [address], [place] (336) 299-0000   °[facility] [address] [place] [address], [place] or [address] (336) 852-7530 °(336) 878-2260   °[facility] [address], [place] (336) 350-1642, phone; (336) 294-5005, fax Sees patients 1st and 3rd Saturday of every month.  Must not qualify for public or private insurance (i.e. Medicaid, Medicare, [place] Health Choice, Veterans' Benefits) • Household income should be no more than 200% of the poverty level •The clinic cannot treat you if you are pregnant or think you are pregnant • Sexually transmitted diseases are not treated at the clinic.  ° ° °Dental Care: °Organization         Address  Phone  Notes  °[place] [facility] [address], [place] (336) 641-6152 Accepts children up to age 21 who are enrolled in Medicaid or [place] Health Choice; pregnant women with a Medicaid card; and children who have applied for Medicaid or [place] Health Choice, but were declined, whose parents can pay a reduced fee at time of service.  °[place]  [facility] [place]  [address], [place] (336) 641-7733 Accepts children up to age 21 who are enrolled in Medicaid or [place] Health Choice; pregnant women with a Medicaid card; and children who have applied for Medicaid or [place] Health Choice, but were declined, whose parents can pay a reduced fee at time of service.  °[facility] ° [address],  (336) 641-4533 Patients are seen by appointment only. Walk-ins are not accepted. [facility] will see patients 18 years of age and older. °Monday - Tuesday (8am-5pm) °Most Wednesdays (8:30-5pm) °$30 per visit, cash only  °[facility] ° [address], [place] (336) 641-4533 Patients are seen by appointment only. Walk-ins are not accepted. [facility] will see patients 18 years of age and older. °One   Wednesday Evening (Monthly: Volunteer Based).  $30 per visit, cash only  °[facility]  (919) 537-3737 for adults; Children under age 4, call Graduate Pediatric Dentistry at ([phone]) [phone]. Children aged 4-14, please call (919) [phone] to request a pediatric application. ° Dental services are provided in all areas of dental care including fillings, crowns and bridges, complete and partial dentures, implants, gum treatment, root canals, and extractions. Preventive care is also provided. Treatment is provided to both adults and children. °Patients are selected via a lottery and there is often a waiting list. °  °[facility] [address], °[place] ° (336) 763-8833 [email or web address] °  °[facility] [address], [place], [place] (336)723-1848, Ext. 123 Second and Fourth Thursday of each month, opens at 6:30 AM; Clinic ends at 9 AM.  Patients are seen on a first-come first-served basis, and a limited number are seen during each clinic.  ° °[facility] ° [address], [place], [place] (336) 723-7904    Eligibility Requirements °You must have lived in [place], [place], or [place] counties for at least the last three months. °  You cannot be eligible for state or federal sponsored healthcare insurance, including Veterans Administration, Medicaid, or Medicare. °  You generally cannot be eligible for healthcare insurance through your employer.  °  How to apply: °Eligibility screenings are held every Tuesday and Wednesday afternoon from 1:00 pm until 4:00 pm. You do not need an appointment for the interview!  °[facility] [address], [place], [place] 336-631-2330   °[facility]  336-342-8273   °[facility]  336-703-3100   °[facility]  336-570-6415   ° °Behavioral Health Resources in the Community: °Intensive Outpatient Programs °Organization         Address  Phone  Notes  °[facility] [address], [place], [place] 336-878-6098   °[facility] [address], [place], [place] 336-832-9800   °[facility] [address], [place], [place] ° 336-882-2125   °[facility] [address],  °[place], [place] 1-800-853-5163 or 336-641-4981   °Substance Abuse Resources °Organization         Address  Phone  Notes  °[facility]  336-882-2125   °[facility]  336-784-9470   °[facility]  336-285-9073   °[facility]  336-845-3988   °Residential & Outpatient Substance Abuse Program  1-800-659-3381   °Psychological Services °Organization         Address  Phone  Notes  °[facility]  336- 832-9600   °[facility]  336- 378-7881   °[facility] [address], [place] 1-800-853-5163 or 336-641-4981   ° °Mobile Crisis Teams °Organization         Address  Phone  Notes  °[facility], Mobile Crisis Care Unit  1-877-626-1772   °Assertive °[facility] ° [address]  [place], [place] 336-834-9664   °Sharon DeEsch [address] °[place] 336-554-5454   ° °Self-Help/Support Groups °Organization         Address  Phone             Notes  °[facility] - variety of support groups  336- 373-1402 Call for more information  °Narcotics Anonymous (NA), [facility] [address], °[place]   2 meetings at this location  ° °  Residential Treatment Programs °Organization         Address  Phone  Notes  °[facility] [address],    °[place]  1-866-801-8205   °[facility] ° [address], [place], [place] 704-293-8524   °[facility] [address], [place] 336-845-3988 Admissions: 8am-3pm M-F  °[facility] [address]    °[place], [place] 336-841-1104   °[facility] [address], [place], [place] 336-379-7146   °[facility] [address]  °[place], [place] 336-285-9073   °[facility] - Intensive Outpatient [address], [place], [place] 336-852-3033   °[facility] [address]  °[place], [place] 1-877-615-2722 or 336-784-9470   °[facility] [address] [place], [place] 336-227-7417 Accepts Medicaid  °[facility] [address]  °[place] 1-800-659-3381 Substance Abuse/Addiction Treatment  ° °[place] Behavioral Health Resources °Organization         Address  Phone  Notes  °[facility]  (888) 581-9988   °Julie Brannon, PhD [address] [place], [place]   (336) 349-5553 or (336) 951-0000   °[facility]   [address] °[place], [place] (336) 349-4454   °[facility] [address], [place], [place] (336) 342-8316 Insurance/Medicaid/sponsorship through [facility]  °[facility] [address]                                    [place], [place] (336) 342-8316 Therapy/tele-psych/case    °[facility] [address]  ° [place], [place] (336) 349-2233    °Dr. Arfeen  (336) 349-4544   °[facility] [place]  [employer] [place] [facility] 1) [address], [place] °2) [address], [place] °3)  [address], [place] (336) 349-3220 °(336) 342-7768 ° °(336) 342-8140   °[place] [facility] (336) 342-1394 or (336) 342-3537 (After Hours)    ° ° ° °Take your usual prescriptions as previously directed.  Apply moist heat or ice to the area(s) of discomfort, for 15 minutes at a time, several times per day for the next few days.  Do not fall asleep on a heating or ice pack.  Call your regular medical doctor on Monday to schedule a follow up appointment in the next 2 days.  Return to the Emergency Department immediately if worsening. ° °

## 2015-05-25 NOTE — ED Notes (Signed)
Resident at [facility].  Found laying in the floor.  C/o pain to head, neck , back,  Right shoulder and left hip.  Also fell last Saturday,  But was not seen.

## 2015-05-25 NOTE — ED Provider Notes (Signed)
CSN: [id number]     Arrival date & time 05/25/15  1527 History   First MD Initiated Contact with Patient 05/25/15 1533     Chief Complaint  Patient presents with  . Fall      HPI Pt was seen at 1530. Per EMS, NH report and pt: Pt s/p fall PTA. Pt states she was trying to get out of a chair when she fell to the floor. Pt c/o hitting her head, neck pain, LBP, right shoulder pain and bilat hips pain. Pt denies LOC, no AMS, no CP/SOB, no abd pain, no N/V/D, no focal motor weakness, no tingling/numbness in extremities.    Past Medical History  Diagnosis Date  . Essential hypertension, benign   . OSA (obstructive sleep apnea)   . Anxiety   . Basal cell carcinoma   . Cardiac murmur   . Diverticulosis   . DJD (degenerative joint disease)   . Alzheimer's disease with early onset   . Chronic constipation   . History of pneumonia   . Depression    Past Surgical History  Procedure Laterality Date  . S/p nasal tip resection  1996  . S/p polypectomy     Family History  Problem Relation Age of Onset  . Stroke Mother   . Stroke Father    History  Substance Use Topics  . Smoking status: Never Smoker   . Smokeless tobacco: Never Used  . Alcohol Use: No    Review of Systems ROS: Statement: All systems negative except as marked or noted in the HPI; Constitutional: Negative for fever and chills. ; ; Eyes: Negative for eye pain, redness and discharge. ; ; ENMT: Negative for ear pain, hoarseness, nasal congestion, sinus pressure and sore throat. ; ; Cardiovascular: Negative for chest pain, palpitations, diaphoresis, dyspnea and peripheral edema. ; ; Respiratory: Negative for cough, wheezing and stridor. ; ; Gastrointestinal: Negative for nausea, vomiting, diarrhea, abdominal pain, blood in stool, hematemesis, jaundice and rectal bleeding. . ; ; Genitourinary: Negative for dysuria, flank pain and hematuria. ; ; Musculoskeletal: +head injury, bilateral hips pain, right shoulder pain, back pain  and neck pain. Negative for swelling and deformity..; ; Skin: Negative for pruritus, rash, abrasions, blisters, bruising and skin lesion.; ; Neuro: Negative for headache, lightheadedness and neck stiffness. Negative for weakness, altered level of consciousness , altered mental status, extremity weakness, paresthesias, involuntary movement, seizure and syncope.     Allergies  Aspirin; Benadryl; Codeine; Penicillins; Phenobarbital; Sulfa antibiotics; Vitamin b12; and Caffeine  Home Medications   Prior to Admission medications   Medication Sig Start Date End Date Taking? Authorizing Provider  alendronate (FOSAMAX) 70 MG tablet Take 70 mg by mouth once a week. Take with a full glass of water on an empty stomach.    Historical Provider, MD  LORazepam (ATIVAN) 0.5 MG tablet Take 0.5 mg by mouth every 8 (eight) hours as needed for anxiety.    Historical Provider, MD  losartan-hydrochlorothiazide (HYZAAR) 100-12.5 MG per tablet Take 1 tablet by mouth daily.    Historical Provider, MD  sertraline (ZOLOFT) 50 MG tablet Take 50 mg by mouth 2 (two) times daily.    Historical Provider, MD   BP 125/55 mmHg  Pulse 65  Temp(Src) 97.5 F (36.4 C) (Oral)  Resp 18  Ht 5\' 4"  (1.626 m)  Wt 130 lb (58.968 kg)  BMI 22.30 kg/m2  SpO2 95% Physical Exam  1535: Physical examination: Vital signs and O2 SAT: Reviewed; Constitutional: Well developed, Well nourished, Well  hydrated, In no acute distress; Head and Face: Normocephalic, Atraumatic; Eyes: EOMI, PERRL, No scleral icterus; ENMT: Mouth and pharynx normal, Left TM normal, Right TM normal, Mucous membranes moist; Neck: Immobilized in C-collar, Trachea midline; Spine: +TTP right cervical and lumbar paraspinal muscles. No midline CS, TS, LS tenderness.; Cardiovascular: Regular rate and rhythm, No gallop; Respiratory: Breath sounds clear & equal bilaterally, No wheezes, Normal respiratory effort/excursion; Chest: Nontender, No deformity, Movement normal, No crepitus,  No abrasions or ecchymosis.; Abdomen: Soft, Nontender, Nondistended, Normal bowel sounds, No abrasions or ecchymosis.; Genitourinary: No CVA tenderness;  Extremities: No deformity, +TTP bilat hips but pt is able to lift extended bilat LE's off stretcher. +generalized TTP right shoulder, no specific area of point tenderness. Otherwise full range of motion major/large joints of bilat UE's and LE's without pain or tenderness to palp, Neurovascularly intact, Pulses normal, No tenderness, No edema, Pelvis stable; Neuro: AA&Ox3, GCS 15.  Major CN grossly intact. Speech clear. No gross focal motor or sensory deficits in extremities.; Skin: Color normal, Warm, Dry    ED Course  Procedures     EKG Interpretation None      MDM  MDM Reviewed: previous chart, nursing note and vitals Interpretation: CT scan and x-ray      Dg Chest 1 View 05/25/2015   CLINICAL DATA:  [place] with pain in both hips and left lower back  EXAM: CHEST  1 VIEW  COMPARISON:  None.  FINDINGS: Uncoiling and atherosclerotic calcification of the aorta. Heart size within normal limits. Vascular pattern normal. No consolidation effusion or pneumothorax. Bony thorax shows no acute findings.  IMPRESSION: No acute abnormalities.  Atherosclerotic change of the aorta.   Electronically Signed   By: Skipper Cliche M.D.   On: 05/25/2015 17:21   Dg Lumbar Spine Complete 05/25/2015   CLINICAL DATA:  Fall.  Low back pain.  EXAM: LUMBAR SPINE - COMPLETE 4+ VIEW  COMPARISON:  Report from 08/29/2011 lumbar MRI, which showed an L5 compression fracture. 04/18/2014 and report from CT abdomen pelvis from [place]: Bony demineralization. Vertebral augmentation at L5. There is 5 mm anterolisthesis at L4-5, probably from the degenerative facet arthropathy  Spurring and loss of intervertebral disc height at the L1-2 level.  Lower lumbar degenerative facet arthropathy.  IMPRESSION: 1. Vertebral augmentation at L5. 2. 4 mm grade 1  anterolisthesis at L4-5, probably from the degenerative facet arthropathy, and documented on the prior lumbar spine CT dated 04/18/2014.   Electronically Signed   By: Van Clines M.D.   On: 05/25/2015 17:25   Dg Shoulder Right 05/25/2015   CLINICAL DATA:  Hip and low back pain.  Fall.  Right shoulder pain.  EXAM: RIGHT SHOULDER - 2+ VIEW  COMPARISON:  05/25/2015 chest radiograph  FINDINGS: There is no evidence of fracture or dislocation. There is no evidence of arthropathy or other focal bone abnormality. Soft tissues are unremarkable.  IMPRESSION: Negative.   Electronically Signed   By: Van Clines M.D.   On: 05/25/2015 17:20   Ct Head Wo Contrast 05/25/2015   CLINICAL DATA:  Complains of pain to head, neck and back. Fall. Found laying on the floor.  EXAM: CT HEAD WITHOUT CONTRAST  CT CERVICAL SPINE WITHOUT CONTRAST  TECHNIQUE: Multidetector CT imaging of the head and cervical spine was performed following the standard protocol without intravenous contrast. Multiplanar CT image reconstructions of the cervical spine were also generated.  COMPARISON:  None.  FINDINGS: CT HEAD FINDINGS  Patchy low attenuation throughout  the subcortical and periventricular white matter noted compatible with chronic microvascular disease. Thin trickles and sulci appear within normal limits. There is no evidence for acute intracranial hemorrhage, cortical infarct or mass. No abnormal extra-axial fluid collections identified. There is partial opacification of the ethmoid air cells. No sinus fluid levels identified. The mastoid air cells are partially opacified. The calvarium is intact.  CT CERVICAL SPINE FINDINGS  Normal alignment of the cervical spine. The vertebral body heights are well preserved. The facet joints are aligned. Mild multi level disc space narrowing and ventral endplate spurring is noted. No fractures. Carotid artery calcifications are noted.  IMPRESSION: 1. No acute intracranial abnormalities. 2.  Chronic microvascular disease. 3. Partial opacification of the mastoid air cells and ethmoid air cells. 4. No evidence for cervical spine fracture. 5. Carotid artery atherosclerotic calcifications.   Electronically Signed   By: Kerby Moors M.D.   On: 05/25/2015 17:00   Ct Cervical Spine Wo Contrast 05/25/2015   CLINICAL DATA:  Complains of pain to head, neck and back. Fall. Found laying on the floor.  EXAM: CT HEAD WITHOUT CONTRAST  CT CERVICAL SPINE WITHOUT CONTRAST  TECHNIQUE: Multidetector CT imaging of the head and cervical spine was performed following the standard protocol without intravenous contrast. Multiplanar CT image reconstructions of the cervical spine were also generated.  COMPARISON:  None.  FINDINGS: CT HEAD FINDINGS  Patchy low attenuation throughout the subcortical and periventricular white matter noted compatible with chronic microvascular disease. Thin trickles and sulci appear within normal limits. There is no evidence for acute intracranial hemorrhage, cortical infarct or mass. No abnormal extra-axial fluid collections identified. There is partial opacification of the ethmoid air cells. No sinus fluid levels identified. The mastoid air cells are partially opacified. The calvarium is intact.  CT CERVICAL SPINE FINDINGS  Normal alignment of the cervical spine. The vertebral body heights are well preserved. The facet joints are aligned. Mild multi level disc space narrowing and ventral endplate spurring is noted. No fractures. Carotid artery calcifications are noted.  IMPRESSION: 1. No acute intracranial abnormalities. 2. Chronic microvascular disease. 3. Partial opacification of the mastoid air cells and ethmoid air cells. 4. No evidence for cervical spine fracture. 5. Carotid artery atherosclerotic calcifications.   Electronically Signed   By: Kerby Moors M.D.   On: 05/25/2015 17:00   Dg Hips Bilat With Pelvis 2v 05/25/2015   CLINICAL DATA:  Fall with bilateral hip pain  EXAM:  BILATERAL HIP (WITH PELVIS) 2 VIEWS  COMPARISON:  None.  FINDINGS: Pelvic bones are intact. No evidence of femur fracture or dislocation on either side.  IMPRESSION: No acute finding   Electronically Signed   By: Skipper Cliche M.D.   On: 05/25/2015 17:24    1800:  CT/XR reassuring. VS remain stable, abd soft/NT, neuro non-focal. No midline CS tenderness, FROM CS without midline tenderness. No NMS changes.  C-collar removed.  Dx and testing d/w pt.  Questions answered.  Verb understanding, agreeable to d/c back to NH with outpt f/u.    Francine Graven, DO 05/28/15 2201

## 2015-05-26 DIAGNOSIS — G300 Alzheimer's disease with early onset: Secondary | ICD-10-CM | POA: Diagnosis not present

## 2015-05-27 DIAGNOSIS — G300 Alzheimer's disease with early onset: Secondary | ICD-10-CM | POA: Diagnosis not present

## 2015-05-28 DIAGNOSIS — G300 Alzheimer's disease with early onset: Secondary | ICD-10-CM | POA: Diagnosis not present

## 2015-05-29 DIAGNOSIS — G300 Alzheimer's disease with early onset: Secondary | ICD-10-CM | POA: Diagnosis not present

## 2015-05-31 DIAGNOSIS — G300 Alzheimer's disease with early onset: Secondary | ICD-10-CM | POA: Diagnosis not present

## 2015-06-01 DIAGNOSIS — G300 Alzheimer's disease with early onset: Secondary | ICD-10-CM | POA: Diagnosis not present

## 2015-06-02 DIAGNOSIS — G300 Alzheimer's disease with early onset: Secondary | ICD-10-CM | POA: Diagnosis not present

## 2015-06-03 DIAGNOSIS — G300 Alzheimer's disease with early onset: Secondary | ICD-10-CM | POA: Diagnosis not present

## 2015-06-04 DIAGNOSIS — Z79899 Other long term (current) drug therapy: Secondary | ICD-10-CM | POA: Diagnosis not present

## 2015-06-04 DIAGNOSIS — S79911A Unspecified injury of right hip, initial encounter: Secondary | ICD-10-CM | POA: Diagnosis not present

## 2015-06-04 DIAGNOSIS — G300 Alzheimer's disease with early onset: Secondary | ICD-10-CM | POA: Diagnosis not present

## 2015-06-04 DIAGNOSIS — F329 Major depressive disorder, single episode, unspecified: Secondary | ICD-10-CM | POA: Diagnosis not present

## 2015-06-04 DIAGNOSIS — Z880 Allergy status to penicillin: Secondary | ICD-10-CM | POA: Diagnosis not present

## 2015-06-04 DIAGNOSIS — S299XXA Unspecified injury of thorax, initial encounter: Secondary | ICD-10-CM | POA: Diagnosis not present

## 2015-06-04 DIAGNOSIS — M25551 Pain in right hip: Secondary | ICD-10-CM | POA: Diagnosis not present

## 2015-06-04 DIAGNOSIS — I10 Essential (primary) hypertension: Secondary | ICD-10-CM | POA: Diagnosis not present

## 2015-06-04 DIAGNOSIS — K589 Irritable bowel syndrome without diarrhea: Secondary | ICD-10-CM | POA: Diagnosis not present

## 2015-06-04 DIAGNOSIS — Z888 Allergy status to other drugs, medicaments and biological substances status: Secondary | ICD-10-CM | POA: Diagnosis not present

## 2015-06-04 DIAGNOSIS — W19XXXA Unspecified fall, initial encounter: Secondary | ICD-10-CM | POA: Diagnosis not present

## 2015-06-04 DIAGNOSIS — M25559 Pain in unspecified hip: Secondary | ICD-10-CM | POA: Diagnosis not present

## 2015-06-05 DIAGNOSIS — G300 Alzheimer's disease with early onset: Secondary | ICD-10-CM | POA: Diagnosis not present

## 2015-06-06 DIAGNOSIS — G300 Alzheimer's disease with early onset: Secondary | ICD-10-CM | POA: Diagnosis not present

## 2015-06-07 DIAGNOSIS — G300 Alzheimer's disease with early onset: Secondary | ICD-10-CM | POA: Diagnosis not present

## 2015-06-08 DIAGNOSIS — G300 Alzheimer's disease with early onset: Secondary | ICD-10-CM | POA: Diagnosis not present

## 2015-06-09 DIAGNOSIS — G300 Alzheimer's disease with early onset: Secondary | ICD-10-CM | POA: Diagnosis not present

## 2015-06-10 DIAGNOSIS — G300 Alzheimer's disease with early onset: Secondary | ICD-10-CM | POA: Diagnosis not present

## 2015-06-11 DIAGNOSIS — G300 Alzheimer's disease with early onset: Secondary | ICD-10-CM | POA: Diagnosis not present

## 2015-06-12 DIAGNOSIS — G300 Alzheimer's disease with early onset: Secondary | ICD-10-CM | POA: Diagnosis not present

## 2015-06-13 DIAGNOSIS — G300 Alzheimer's disease with early onset: Secondary | ICD-10-CM | POA: Diagnosis not present

## 2015-06-14 DIAGNOSIS — G300 Alzheimer's disease with early onset: Secondary | ICD-10-CM | POA: Diagnosis not present

## 2015-06-15 DIAGNOSIS — G300 Alzheimer's disease with early onset: Secondary | ICD-10-CM | POA: Diagnosis not present

## 2015-06-16 DIAGNOSIS — G300 Alzheimer's disease with early onset: Secondary | ICD-10-CM | POA: Diagnosis not present

## 2015-06-17 DIAGNOSIS — G300 Alzheimer's disease with early onset: Secondary | ICD-10-CM | POA: Diagnosis not present

## 2015-06-18 DIAGNOSIS — G300 Alzheimer's disease with early onset: Secondary | ICD-10-CM | POA: Diagnosis not present

## 2015-06-19 DIAGNOSIS — G300 Alzheimer's disease with early onset: Secondary | ICD-10-CM | POA: Diagnosis not present

## 2015-06-20 DIAGNOSIS — R51 Headache: Secondary | ICD-10-CM | POA: Diagnosis not present

## 2015-06-20 DIAGNOSIS — G300 Alzheimer's disease with early onset: Secondary | ICD-10-CM | POA: Diagnosis not present

## 2015-06-20 DIAGNOSIS — R42 Dizziness and giddiness: Secondary | ICD-10-CM | POA: Diagnosis not present

## 2015-06-21 DIAGNOSIS — G300 Alzheimer's disease with early onset: Secondary | ICD-10-CM | POA: Diagnosis not present

## 2015-06-22 DIAGNOSIS — G300 Alzheimer's disease with early onset: Secondary | ICD-10-CM | POA: Diagnosis not present

## 2015-06-23 DIAGNOSIS — G300 Alzheimer's disease with early onset: Secondary | ICD-10-CM | POA: Diagnosis not present

## 2015-06-24 DIAGNOSIS — G300 Alzheimer's disease with early onset: Secondary | ICD-10-CM | POA: Diagnosis not present

## 2015-06-25 DIAGNOSIS — G300 Alzheimer's disease with early onset: Secondary | ICD-10-CM | POA: Diagnosis not present

## 2015-06-26 DIAGNOSIS — G300 Alzheimer's disease with early onset: Secondary | ICD-10-CM | POA: Diagnosis not present

## 2015-06-26 DIAGNOSIS — G459 Transient cerebral ischemic attack, unspecified: Secondary | ICD-10-CM | POA: Diagnosis not present

## 2015-06-26 DIAGNOSIS — G319 Degenerative disease of nervous system, unspecified: Secondary | ICD-10-CM | POA: Diagnosis not present

## 2015-06-26 DIAGNOSIS — R51 Headache: Secondary | ICD-10-CM | POA: Diagnosis not present

## 2015-06-26 DIAGNOSIS — R296 Repeated falls: Secondary | ICD-10-CM | POA: Diagnosis not present

## 2015-06-27 DIAGNOSIS — G300 Alzheimer's disease with early onset: Secondary | ICD-10-CM | POA: Diagnosis not present

## 2015-06-28 DIAGNOSIS — G300 Alzheimer's disease with early onset: Secondary | ICD-10-CM | POA: Diagnosis not present

## 2015-06-29 DIAGNOSIS — G300 Alzheimer's disease with early onset: Secondary | ICD-10-CM | POA: Diagnosis not present

## 2015-06-30 DIAGNOSIS — G300 Alzheimer's disease with early onset: Secondary | ICD-10-CM | POA: Diagnosis not present

## 2015-07-01 DIAGNOSIS — G300 Alzheimer's disease with early onset: Secondary | ICD-10-CM | POA: Diagnosis not present

## 2015-07-02 DIAGNOSIS — G300 Alzheimer's disease with early onset: Secondary | ICD-10-CM | POA: Diagnosis not present

## 2015-07-03 DIAGNOSIS — G300 Alzheimer's disease with early onset: Secondary | ICD-10-CM | POA: Diagnosis not present

## 2015-07-04 DIAGNOSIS — G300 Alzheimer's disease with early onset: Secondary | ICD-10-CM | POA: Diagnosis not present

## 2015-07-05 DIAGNOSIS — G300 Alzheimer's disease with early onset: Secondary | ICD-10-CM | POA: Diagnosis not present

## 2015-07-06 DIAGNOSIS — G300 Alzheimer's disease with early onset: Secondary | ICD-10-CM | POA: Diagnosis not present

## 2015-07-07 DIAGNOSIS — G300 Alzheimer's disease with early onset: Secondary | ICD-10-CM | POA: Diagnosis not present

## 2015-07-08 DIAGNOSIS — G300 Alzheimer's disease with early onset: Secondary | ICD-10-CM | POA: Diagnosis not present

## 2015-07-09 DIAGNOSIS — G300 Alzheimer's disease with early onset: Secondary | ICD-10-CM | POA: Diagnosis not present

## 2015-07-10 DIAGNOSIS — G300 Alzheimer's disease with early onset: Secondary | ICD-10-CM | POA: Diagnosis not present

## 2015-07-11 DIAGNOSIS — G300 Alzheimer's disease with early onset: Secondary | ICD-10-CM | POA: Diagnosis not present

## 2015-07-12 DIAGNOSIS — G300 Alzheimer's disease with early onset: Secondary | ICD-10-CM | POA: Diagnosis not present

## 2015-07-13 DIAGNOSIS — G300 Alzheimer's disease with early onset: Secondary | ICD-10-CM | POA: Diagnosis not present

## 2015-07-14 DIAGNOSIS — G300 Alzheimer's disease with early onset: Secondary | ICD-10-CM | POA: Diagnosis not present

## 2015-07-15 DIAGNOSIS — G300 Alzheimer's disease with early onset: Secondary | ICD-10-CM | POA: Diagnosis not present

## 2015-07-16 DIAGNOSIS — G300 Alzheimer's disease with early onset: Secondary | ICD-10-CM | POA: Diagnosis not present

## 2015-07-17 DIAGNOSIS — G300 Alzheimer's disease with early onset: Secondary | ICD-10-CM | POA: Diagnosis not present

## 2015-07-18 DIAGNOSIS — G300 Alzheimer's disease with early onset: Secondary | ICD-10-CM | POA: Diagnosis not present

## 2015-07-19 DIAGNOSIS — G300 Alzheimer's disease with early onset: Secondary | ICD-10-CM | POA: Diagnosis not present

## 2015-07-20 DIAGNOSIS — G300 Alzheimer's disease with early onset: Secondary | ICD-10-CM | POA: Diagnosis not present

## 2015-07-21 DIAGNOSIS — G300 Alzheimer's disease with early onset: Secondary | ICD-10-CM | POA: Diagnosis not present

## 2015-07-22 DIAGNOSIS — G300 Alzheimer's disease with early onset: Secondary | ICD-10-CM | POA: Diagnosis not present

## 2015-07-23 DIAGNOSIS — I10 Essential (primary) hypertension: Secondary | ICD-10-CM | POA: Diagnosis not present

## 2015-07-23 DIAGNOSIS — E039 Hypothyroidism, unspecified: Secondary | ICD-10-CM | POA: Diagnosis not present

## 2015-07-23 DIAGNOSIS — E0580 Other thyrotoxicosis without thyrotoxic crisis or storm: Secondary | ICD-10-CM | POA: Diagnosis not present

## 2015-07-23 DIAGNOSIS — E6609 Other obesity due to excess calories: Secondary | ICD-10-CM | POA: Diagnosis not present

## 2015-07-23 DIAGNOSIS — G300 Alzheimer's disease with early onset: Secondary | ICD-10-CM | POA: Diagnosis not present

## 2015-07-23 DIAGNOSIS — E782 Mixed hyperlipidemia: Secondary | ICD-10-CM | POA: Diagnosis not present

## 2015-07-24 DIAGNOSIS — G300 Alzheimer's disease with early onset: Secondary | ICD-10-CM | POA: Diagnosis not present

## 2015-07-25 DIAGNOSIS — G300 Alzheimer's disease with early onset: Secondary | ICD-10-CM | POA: Diagnosis not present

## 2015-07-26 DIAGNOSIS — G300 Alzheimer's disease with early onset: Secondary | ICD-10-CM | POA: Diagnosis not present

## 2015-07-27 DIAGNOSIS — G300 Alzheimer's disease with early onset: Secondary | ICD-10-CM | POA: Diagnosis not present

## 2015-07-28 DIAGNOSIS — G300 Alzheimer's disease with early onset: Secondary | ICD-10-CM | POA: Diagnosis not present

## 2015-07-29 DIAGNOSIS — G300 Alzheimer's disease with early onset: Secondary | ICD-10-CM | POA: Diagnosis not present

## 2015-07-30 DIAGNOSIS — J301 Allergic rhinitis due to pollen: Secondary | ICD-10-CM | POA: Diagnosis not present

## 2015-07-30 DIAGNOSIS — E782 Mixed hyperlipidemia: Secondary | ICD-10-CM | POA: Diagnosis not present

## 2015-07-30 DIAGNOSIS — E0580 Other thyrotoxicosis without thyrotoxic crisis or storm: Secondary | ICD-10-CM | POA: Diagnosis not present

## 2015-07-30 DIAGNOSIS — G4733 Obstructive sleep apnea (adult) (pediatric): Secondary | ICD-10-CM | POA: Diagnosis not present

## 2015-07-30 DIAGNOSIS — G300 Alzheimer's disease with early onset: Secondary | ICD-10-CM | POA: Diagnosis not present

## 2015-07-30 DIAGNOSIS — I10 Essential (primary) hypertension: Secondary | ICD-10-CM | POA: Diagnosis not present

## 2015-07-30 DIAGNOSIS — Z23 Encounter for immunization: Secondary | ICD-10-CM | POA: Diagnosis not present

## 2015-07-30 DIAGNOSIS — E6609 Other obesity due to excess calories: Secondary | ICD-10-CM | POA: Diagnosis not present

## 2015-07-31 DIAGNOSIS — G300 Alzheimer's disease with early onset: Secondary | ICD-10-CM | POA: Diagnosis not present

## 2015-08-01 DIAGNOSIS — G300 Alzheimer's disease with early onset: Secondary | ICD-10-CM | POA: Diagnosis not present

## 2015-08-02 DIAGNOSIS — G300 Alzheimer's disease with early onset: Secondary | ICD-10-CM | POA: Diagnosis not present

## 2015-08-03 DIAGNOSIS — G300 Alzheimer's disease with early onset: Secondary | ICD-10-CM | POA: Diagnosis not present

## 2015-08-04 DIAGNOSIS — G300 Alzheimer's disease with early onset: Secondary | ICD-10-CM | POA: Diagnosis not present

## 2015-08-05 DIAGNOSIS — G300 Alzheimer's disease with early onset: Secondary | ICD-10-CM | POA: Diagnosis not present

## 2015-08-06 DIAGNOSIS — G300 Alzheimer's disease with early onset: Secondary | ICD-10-CM | POA: Diagnosis not present

## 2015-08-07 DIAGNOSIS — G300 Alzheimer's disease with early onset: Secondary | ICD-10-CM | POA: Diagnosis not present

## 2015-08-08 DIAGNOSIS — G300 Alzheimer's disease with early onset: Secondary | ICD-10-CM | POA: Diagnosis not present

## 2015-08-09 DIAGNOSIS — G300 Alzheimer's disease with early onset: Secondary | ICD-10-CM | POA: Diagnosis not present

## 2015-08-10 DIAGNOSIS — G300 Alzheimer's disease with early onset: Secondary | ICD-10-CM | POA: Diagnosis not present

## 2015-08-11 DIAGNOSIS — G300 Alzheimer's disease with early onset: Secondary | ICD-10-CM | POA: Diagnosis not present

## 2015-08-12 DIAGNOSIS — G300 Alzheimer's disease with early onset: Secondary | ICD-10-CM | POA: Diagnosis not present

## 2015-08-13 DIAGNOSIS — G300 Alzheimer's disease with early onset: Secondary | ICD-10-CM | POA: Diagnosis not present

## 2015-08-14 DIAGNOSIS — G300 Alzheimer's disease with early onset: Secondary | ICD-10-CM | POA: Diagnosis not present

## 2015-08-15 DIAGNOSIS — G300 Alzheimer's disease with early onset: Secondary | ICD-10-CM | POA: Diagnosis not present

## 2015-08-16 DIAGNOSIS — G300 Alzheimer's disease with early onset: Secondary | ICD-10-CM | POA: Diagnosis not present

## 2015-08-17 DIAGNOSIS — G300 Alzheimer's disease with early onset: Secondary | ICD-10-CM | POA: Diagnosis not present

## 2015-08-18 DIAGNOSIS — G300 Alzheimer's disease with early onset: Secondary | ICD-10-CM | POA: Diagnosis not present

## 2015-08-19 DIAGNOSIS — G300 Alzheimer's disease with early onset: Secondary | ICD-10-CM | POA: Diagnosis not present

## 2015-08-20 DIAGNOSIS — G300 Alzheimer's disease with early onset: Secondary | ICD-10-CM | POA: Diagnosis not present

## 2015-08-21 DIAGNOSIS — G300 Alzheimer's disease with early onset: Secondary | ICD-10-CM | POA: Diagnosis not present

## 2015-08-22 DIAGNOSIS — G300 Alzheimer's disease with early onset: Secondary | ICD-10-CM | POA: Diagnosis not present

## 2015-08-23 DIAGNOSIS — G300 Alzheimer's disease with early onset: Secondary | ICD-10-CM | POA: Diagnosis not present

## 2015-08-24 DIAGNOSIS — G300 Alzheimer's disease with early onset: Secondary | ICD-10-CM | POA: Diagnosis not present

## 2015-08-25 DIAGNOSIS — G300 Alzheimer's disease with early onset: Secondary | ICD-10-CM | POA: Diagnosis not present

## 2015-08-26 DIAGNOSIS — G300 Alzheimer's disease with early onset: Secondary | ICD-10-CM | POA: Diagnosis not present

## 2015-08-27 DIAGNOSIS — G300 Alzheimer's disease with early onset: Secondary | ICD-10-CM | POA: Diagnosis not present

## 2015-08-28 DIAGNOSIS — G300 Alzheimer's disease with early onset: Secondary | ICD-10-CM | POA: Diagnosis not present

## 2015-08-29 DIAGNOSIS — N3090 Cystitis, unspecified without hematuria: Secondary | ICD-10-CM | POA: Diagnosis not present

## 2015-08-29 DIAGNOSIS — N390 Urinary tract infection, site not specified: Secondary | ICD-10-CM | POA: Diagnosis not present

## 2015-08-29 DIAGNOSIS — G300 Alzheimer's disease with early onset: Secondary | ICD-10-CM | POA: Diagnosis not present

## 2015-08-30 DIAGNOSIS — I10 Essential (primary) hypertension: Secondary | ICD-10-CM | POA: Diagnosis not present

## 2015-08-30 DIAGNOSIS — G300 Alzheimer's disease with early onset: Secondary | ICD-10-CM | POA: Diagnosis not present

## 2015-08-30 DIAGNOSIS — M353 Polymyalgia rheumatica: Secondary | ICD-10-CM | POA: Diagnosis not present

## 2015-08-30 DIAGNOSIS — F329 Major depressive disorder, single episode, unspecified: Secondary | ICD-10-CM | POA: Diagnosis not present

## 2015-08-30 DIAGNOSIS — G309 Alzheimer's disease, unspecified: Secondary | ICD-10-CM | POA: Diagnosis not present

## 2015-08-31 DIAGNOSIS — G300 Alzheimer's disease with early onset: Secondary | ICD-10-CM | POA: Diagnosis not present

## 2015-09-01 DIAGNOSIS — G300 Alzheimer's disease with early onset: Secondary | ICD-10-CM | POA: Diagnosis not present

## 2015-09-02 DIAGNOSIS — G300 Alzheimer's disease with early onset: Secondary | ICD-10-CM | POA: Diagnosis not present

## 2015-09-03 DIAGNOSIS — G300 Alzheimer's disease with early onset: Secondary | ICD-10-CM | POA: Diagnosis not present

## 2015-09-04 DIAGNOSIS — G300 Alzheimer's disease with early onset: Secondary | ICD-10-CM | POA: Diagnosis not present

## 2015-09-05 DIAGNOSIS — G300 Alzheimer's disease with early onset: Secondary | ICD-10-CM | POA: Diagnosis not present

## 2015-09-06 DIAGNOSIS — G300 Alzheimer's disease with early onset: Secondary | ICD-10-CM | POA: Diagnosis not present

## 2015-09-07 DIAGNOSIS — G300 Alzheimer's disease with early onset: Secondary | ICD-10-CM | POA: Diagnosis not present

## 2015-09-08 DIAGNOSIS — G300 Alzheimer's disease with early onset: Secondary | ICD-10-CM | POA: Diagnosis not present

## 2015-09-09 DIAGNOSIS — G300 Alzheimer's disease with early onset: Secondary | ICD-10-CM | POA: Diagnosis not present

## 2015-09-10 DIAGNOSIS — G300 Alzheimer's disease with early onset: Secondary | ICD-10-CM | POA: Diagnosis not present

## 2015-09-11 DIAGNOSIS — G300 Alzheimer's disease with early onset: Secondary | ICD-10-CM | POA: Diagnosis not present

## 2015-09-12 DIAGNOSIS — G300 Alzheimer's disease with early onset: Secondary | ICD-10-CM | POA: Diagnosis not present

## 2015-09-13 DIAGNOSIS — G300 Alzheimer's disease with early onset: Secondary | ICD-10-CM | POA: Diagnosis not present

## 2015-09-14 DIAGNOSIS — G300 Alzheimer's disease with early onset: Secondary | ICD-10-CM | POA: Diagnosis not present

## 2015-09-15 DIAGNOSIS — G300 Alzheimer's disease with early onset: Secondary | ICD-10-CM | POA: Diagnosis not present

## 2015-09-16 DIAGNOSIS — G300 Alzheimer's disease with early onset: Secondary | ICD-10-CM | POA: Diagnosis not present

## 2015-09-17 DIAGNOSIS — G300 Alzheimer's disease with early onset: Secondary | ICD-10-CM | POA: Diagnosis not present

## 2015-09-18 DIAGNOSIS — G300 Alzheimer's disease with early onset: Secondary | ICD-10-CM | POA: Diagnosis not present

## 2015-09-19 DIAGNOSIS — G300 Alzheimer's disease with early onset: Secondary | ICD-10-CM | POA: Diagnosis not present

## 2015-09-20 DIAGNOSIS — H353131 Nonexudative age-related macular degeneration, bilateral, early dry stage: Secondary | ICD-10-CM | POA: Diagnosis not present

## 2015-09-20 DIAGNOSIS — G300 Alzheimer's disease with early onset: Secondary | ICD-10-CM | POA: Diagnosis not present

## 2015-09-21 DIAGNOSIS — G300 Alzheimer's disease with early onset: Secondary | ICD-10-CM | POA: Diagnosis not present

## 2015-09-22 DIAGNOSIS — G300 Alzheimer's disease with early onset: Secondary | ICD-10-CM | POA: Diagnosis not present

## 2015-09-23 DIAGNOSIS — G300 Alzheimer's disease with early onset: Secondary | ICD-10-CM | POA: Diagnosis not present

## 2015-09-24 DIAGNOSIS — G300 Alzheimer's disease with early onset: Secondary | ICD-10-CM | POA: Diagnosis not present

## 2015-09-25 DIAGNOSIS — G300 Alzheimer's disease with early onset: Secondary | ICD-10-CM | POA: Diagnosis not present

## 2015-09-26 DIAGNOSIS — G300 Alzheimer's disease with early onset: Secondary | ICD-10-CM | POA: Diagnosis not present

## 2015-09-27 DIAGNOSIS — G300 Alzheimer's disease with early onset: Secondary | ICD-10-CM | POA: Diagnosis not present

## 2015-09-28 DIAGNOSIS — G300 Alzheimer's disease with early onset: Secondary | ICD-10-CM | POA: Diagnosis not present

## 2015-09-29 DIAGNOSIS — G300 Alzheimer's disease with early onset: Secondary | ICD-10-CM | POA: Diagnosis not present

## 2015-09-30 DIAGNOSIS — G300 Alzheimer's disease with early onset: Secondary | ICD-10-CM | POA: Diagnosis not present

## 2015-10-01 DIAGNOSIS — R32 Unspecified urinary incontinence: Secondary | ICD-10-CM | POA: Diagnosis not present

## 2015-10-01 DIAGNOSIS — G300 Alzheimer's disease with early onset: Secondary | ICD-10-CM | POA: Diagnosis not present

## 2015-10-02 DIAGNOSIS — G300 Alzheimer's disease with early onset: Secondary | ICD-10-CM | POA: Diagnosis not present

## 2015-10-03 DIAGNOSIS — G300 Alzheimer's disease with early onset: Secondary | ICD-10-CM | POA: Diagnosis not present

## 2015-10-04 DIAGNOSIS — G300 Alzheimer's disease with early onset: Secondary | ICD-10-CM | POA: Diagnosis not present

## 2015-10-05 DIAGNOSIS — G300 Alzheimer's disease with early onset: Secondary | ICD-10-CM | POA: Diagnosis not present

## 2015-10-06 DIAGNOSIS — G300 Alzheimer's disease with early onset: Secondary | ICD-10-CM | POA: Diagnosis not present

## 2015-10-07 DIAGNOSIS — G300 Alzheimer's disease with early onset: Secondary | ICD-10-CM | POA: Diagnosis not present

## 2015-10-08 DIAGNOSIS — G300 Alzheimer's disease with early onset: Secondary | ICD-10-CM | POA: Diagnosis not present

## 2015-10-09 DIAGNOSIS — G300 Alzheimer's disease with early onset: Secondary | ICD-10-CM | POA: Diagnosis not present

## 2015-10-10 DIAGNOSIS — G300 Alzheimer's disease with early onset: Secondary | ICD-10-CM | POA: Diagnosis not present

## 2015-10-11 DIAGNOSIS — G300 Alzheimer's disease with early onset: Secondary | ICD-10-CM | POA: Diagnosis not present

## 2015-10-12 DIAGNOSIS — G300 Alzheimer's disease with early onset: Secondary | ICD-10-CM | POA: Diagnosis not present

## 2015-10-13 DIAGNOSIS — G300 Alzheimer's disease with early onset: Secondary | ICD-10-CM | POA: Diagnosis not present

## 2015-10-14 DIAGNOSIS — G300 Alzheimer's disease with early onset: Secondary | ICD-10-CM | POA: Diagnosis not present

## 2015-10-15 DIAGNOSIS — G300 Alzheimer's disease with early onset: Secondary | ICD-10-CM | POA: Diagnosis not present

## 2015-10-16 DIAGNOSIS — G300 Alzheimer's disease with early onset: Secondary | ICD-10-CM | POA: Diagnosis not present

## 2015-10-17 DIAGNOSIS — G300 Alzheimer's disease with early onset: Secondary | ICD-10-CM | POA: Diagnosis not present

## 2015-10-18 DIAGNOSIS — G300 Alzheimer's disease with early onset: Secondary | ICD-10-CM | POA: Diagnosis not present

## 2015-10-19 DIAGNOSIS — G300 Alzheimer's disease with early onset: Secondary | ICD-10-CM | POA: Diagnosis not present

## 2015-10-20 DIAGNOSIS — G300 Alzheimer's disease with early onset: Secondary | ICD-10-CM | POA: Diagnosis not present

## 2015-10-21 DIAGNOSIS — G300 Alzheimer's disease with early onset: Secondary | ICD-10-CM | POA: Diagnosis not present

## 2015-10-22 DIAGNOSIS — G300 Alzheimer's disease with early onset: Secondary | ICD-10-CM | POA: Diagnosis not present

## 2015-10-23 DIAGNOSIS — G300 Alzheimer's disease with early onset: Secondary | ICD-10-CM | POA: Diagnosis not present

## 2015-10-24 DIAGNOSIS — G300 Alzheimer's disease with early onset: Secondary | ICD-10-CM | POA: Diagnosis not present

## 2015-10-25 DIAGNOSIS — G300 Alzheimer's disease with early onset: Secondary | ICD-10-CM | POA: Diagnosis not present

## 2015-10-26 DIAGNOSIS — G300 Alzheimer's disease with early onset: Secondary | ICD-10-CM | POA: Diagnosis not present

## 2015-10-27 DIAGNOSIS — G300 Alzheimer's disease with early onset: Secondary | ICD-10-CM | POA: Diagnosis not present

## 2015-10-28 DIAGNOSIS — G300 Alzheimer's disease with early onset: Secondary | ICD-10-CM | POA: Diagnosis not present

## 2015-10-29 DIAGNOSIS — G300 Alzheimer's disease with early onset: Secondary | ICD-10-CM | POA: Diagnosis not present

## 2015-10-30 DIAGNOSIS — R32 Unspecified urinary incontinence: Secondary | ICD-10-CM | POA: Diagnosis not present

## 2015-10-30 DIAGNOSIS — G300 Alzheimer's disease with early onset: Secondary | ICD-10-CM | POA: Diagnosis not present

## 2015-10-31 DIAGNOSIS — G300 Alzheimer's disease with early onset: Secondary | ICD-10-CM | POA: Diagnosis not present

## 2015-11-01 DIAGNOSIS — G300 Alzheimer's disease with early onset: Secondary | ICD-10-CM | POA: Diagnosis not present

## 2015-11-02 DIAGNOSIS — Z880 Allergy status to penicillin: Secondary | ICD-10-CM | POA: Diagnosis not present

## 2015-11-02 DIAGNOSIS — R109 Unspecified abdominal pain: Secondary | ICD-10-CM | POA: Diagnosis not present

## 2015-11-02 DIAGNOSIS — I10 Essential (primary) hypertension: Secondary | ICD-10-CM | POA: Diagnosis not present

## 2015-11-02 DIAGNOSIS — Z886 Allergy status to analgesic agent status: Secondary | ICD-10-CM | POA: Diagnosis not present

## 2015-11-02 DIAGNOSIS — G300 Alzheimer's disease with early onset: Secondary | ICD-10-CM | POA: Diagnosis not present

## 2015-11-02 DIAGNOSIS — G309 Alzheimer's disease, unspecified: Secondary | ICD-10-CM | POA: Diagnosis not present

## 2015-11-02 DIAGNOSIS — R110 Nausea: Secondary | ICD-10-CM | POA: Diagnosis not present

## 2015-11-02 DIAGNOSIS — F329 Major depressive disorder, single episode, unspecified: Secondary | ICD-10-CM | POA: Diagnosis not present

## 2015-11-02 DIAGNOSIS — R197 Diarrhea, unspecified: Secondary | ICD-10-CM | POA: Diagnosis not present

## 2015-11-02 DIAGNOSIS — R42 Dizziness and giddiness: Secondary | ICD-10-CM | POA: Diagnosis not present

## 2015-11-02 DIAGNOSIS — S0990XA Unspecified injury of head, initial encounter: Secondary | ICD-10-CM | POA: Diagnosis not present

## 2015-11-02 DIAGNOSIS — K589 Irritable bowel syndrome without diarrhea: Secondary | ICD-10-CM | POA: Diagnosis not present

## 2015-11-02 DIAGNOSIS — R51 Headache: Secondary | ICD-10-CM | POA: Diagnosis not present

## 2015-11-02 DIAGNOSIS — R1084 Generalized abdominal pain: Secondary | ICD-10-CM | POA: Diagnosis not present

## 2015-11-02 DIAGNOSIS — R079 Chest pain, unspecified: Secondary | ICD-10-CM | POA: Diagnosis not present

## 2015-11-02 DIAGNOSIS — D239 Other benign neoplasm of skin, unspecified: Secondary | ICD-10-CM | POA: Diagnosis not present

## 2015-11-02 DIAGNOSIS — J309 Allergic rhinitis, unspecified: Secondary | ICD-10-CM | POA: Diagnosis not present

## 2015-11-02 DIAGNOSIS — K2970 Gastritis, unspecified, without bleeding: Secondary | ICD-10-CM | POA: Diagnosis not present

## 2015-11-03 DIAGNOSIS — K589 Irritable bowel syndrome without diarrhea: Secondary | ICD-10-CM | POA: Diagnosis not present

## 2015-11-03 DIAGNOSIS — G300 Alzheimer's disease with early onset: Secondary | ICD-10-CM | POA: Diagnosis not present

## 2015-11-03 DIAGNOSIS — D239 Other benign neoplasm of skin, unspecified: Secondary | ICD-10-CM | POA: Diagnosis not present

## 2015-11-03 DIAGNOSIS — R079 Chest pain, unspecified: Secondary | ICD-10-CM | POA: Diagnosis not present

## 2015-11-03 DIAGNOSIS — J309 Allergic rhinitis, unspecified: Secondary | ICD-10-CM | POA: Diagnosis not present

## 2015-11-03 DIAGNOSIS — Z886 Allergy status to analgesic agent status: Secondary | ICD-10-CM | POA: Diagnosis not present

## 2015-11-03 DIAGNOSIS — F329 Major depressive disorder, single episode, unspecified: Secondary | ICD-10-CM | POA: Diagnosis not present

## 2015-11-03 DIAGNOSIS — Z880 Allergy status to penicillin: Secondary | ICD-10-CM | POA: Diagnosis not present

## 2015-11-03 DIAGNOSIS — I10 Essential (primary) hypertension: Secondary | ICD-10-CM | POA: Diagnosis not present

## 2015-11-03 DIAGNOSIS — G309 Alzheimer's disease, unspecified: Secondary | ICD-10-CM | POA: Diagnosis not present

## 2015-11-04 DIAGNOSIS — G300 Alzheimer's disease with early onset: Secondary | ICD-10-CM | POA: Diagnosis not present

## 2015-11-05 DIAGNOSIS — G300 Alzheimer's disease with early onset: Secondary | ICD-10-CM | POA: Diagnosis not present

## 2015-11-06 DIAGNOSIS — G300 Alzheimer's disease with early onset: Secondary | ICD-10-CM | POA: Diagnosis not present

## 2015-11-07 DIAGNOSIS — G300 Alzheimer's disease with early onset: Secondary | ICD-10-CM | POA: Diagnosis not present

## 2015-11-08 DIAGNOSIS — G300 Alzheimer's disease with early onset: Secondary | ICD-10-CM | POA: Diagnosis not present

## 2015-11-09 DIAGNOSIS — G300 Alzheimer's disease with early onset: Secondary | ICD-10-CM | POA: Diagnosis not present

## 2015-11-10 DIAGNOSIS — G300 Alzheimer's disease with early onset: Secondary | ICD-10-CM | POA: Diagnosis not present

## 2015-11-11 DIAGNOSIS — G300 Alzheimer's disease with early onset: Secondary | ICD-10-CM | POA: Diagnosis not present

## 2015-11-12 DIAGNOSIS — G300 Alzheimer's disease with early onset: Secondary | ICD-10-CM | POA: Diagnosis not present

## 2015-11-13 DIAGNOSIS — G300 Alzheimer's disease with early onset: Secondary | ICD-10-CM | POA: Diagnosis not present

## 2015-11-13 DIAGNOSIS — R072 Precordial pain: Secondary | ICD-10-CM | POA: Diagnosis not present

## 2015-11-13 DIAGNOSIS — M545 Low back pain: Secondary | ICD-10-CM | POA: Diagnosis not present

## 2015-11-14 DIAGNOSIS — G300 Alzheimer's disease with early onset: Secondary | ICD-10-CM | POA: Diagnosis not present

## 2015-11-15 DIAGNOSIS — G300 Alzheimer's disease with early onset: Secondary | ICD-10-CM | POA: Diagnosis not present

## 2015-11-16 DIAGNOSIS — G300 Alzheimer's disease with early onset: Secondary | ICD-10-CM | POA: Diagnosis not present

## 2015-11-17 DIAGNOSIS — G300 Alzheimer's disease with early onset: Secondary | ICD-10-CM | POA: Diagnosis not present

## 2015-11-18 DIAGNOSIS — G300 Alzheimer's disease with early onset: Secondary | ICD-10-CM | POA: Diagnosis not present

## 2015-11-19 DIAGNOSIS — G300 Alzheimer's disease with early onset: Secondary | ICD-10-CM | POA: Diagnosis not present

## 2015-11-20 DIAGNOSIS — G300 Alzheimer's disease with early onset: Secondary | ICD-10-CM | POA: Diagnosis not present

## 2015-11-21 DIAGNOSIS — G300 Alzheimer's disease with early onset: Secondary | ICD-10-CM | POA: Diagnosis not present

## 2015-11-22 DIAGNOSIS — G300 Alzheimer's disease with early onset: Secondary | ICD-10-CM | POA: Diagnosis not present

## 2015-11-23 DIAGNOSIS — G300 Alzheimer's disease with early onset: Secondary | ICD-10-CM | POA: Diagnosis not present

## 2015-11-24 DIAGNOSIS — G300 Alzheimer's disease with early onset: Secondary | ICD-10-CM | POA: Diagnosis not present

## 2015-11-25 DIAGNOSIS — G300 Alzheimer's disease with early onset: Secondary | ICD-10-CM | POA: Diagnosis not present

## 2015-11-26 DIAGNOSIS — G300 Alzheimer's disease with early onset: Secondary | ICD-10-CM | POA: Diagnosis not present

## 2015-11-27 DIAGNOSIS — G300 Alzheimer's disease with early onset: Secondary | ICD-10-CM | POA: Diagnosis not present

## 2015-11-28 DIAGNOSIS — G300 Alzheimer's disease with early onset: Secondary | ICD-10-CM | POA: Diagnosis not present

## 2015-11-29 DIAGNOSIS — G300 Alzheimer's disease with early onset: Secondary | ICD-10-CM | POA: Diagnosis not present

## 2015-11-30 DIAGNOSIS — G300 Alzheimer's disease with early onset: Secondary | ICD-10-CM | POA: Diagnosis not present

## 2015-12-01 DIAGNOSIS — G300 Alzheimer's disease with early onset: Secondary | ICD-10-CM | POA: Diagnosis not present

## 2015-12-02 DIAGNOSIS — Z7952 Long term (current) use of systemic steroids: Secondary | ICD-10-CM | POA: Diagnosis not present

## 2015-12-02 DIAGNOSIS — G300 Alzheimer's disease with early onset: Secondary | ICD-10-CM | POA: Diagnosis not present

## 2015-12-02 DIAGNOSIS — R0600 Dyspnea, unspecified: Secondary | ICD-10-CM | POA: Diagnosis not present

## 2015-12-02 DIAGNOSIS — R079 Chest pain, unspecified: Secondary | ICD-10-CM | POA: Diagnosis not present

## 2015-12-02 DIAGNOSIS — Z7951 Long term (current) use of inhaled steroids: Secondary | ICD-10-CM | POA: Diagnosis not present

## 2015-12-02 DIAGNOSIS — J9811 Atelectasis: Secondary | ICD-10-CM | POA: Diagnosis not present

## 2015-12-02 DIAGNOSIS — E7800 Pure hypercholesterolemia, unspecified: Secondary | ICD-10-CM | POA: Diagnosis not present

## 2015-12-02 DIAGNOSIS — Z79899 Other long term (current) drug therapy: Secondary | ICD-10-CM | POA: Diagnosis not present

## 2015-12-02 DIAGNOSIS — M549 Dorsalgia, unspecified: Secondary | ICD-10-CM | POA: Diagnosis not present

## 2015-12-02 DIAGNOSIS — I10 Essential (primary) hypertension: Secondary | ICD-10-CM | POA: Diagnosis not present

## 2015-12-02 DIAGNOSIS — Z9181 History of falling: Secondary | ICD-10-CM | POA: Diagnosis not present

## 2015-12-02 DIAGNOSIS — Z7401 Bed confinement status: Secondary | ICD-10-CM | POA: Diagnosis not present

## 2015-12-02 DIAGNOSIS — R51 Headache: Secondary | ICD-10-CM | POA: Diagnosis not present

## 2015-12-03 DIAGNOSIS — R072 Precordial pain: Secondary | ICD-10-CM | POA: Diagnosis not present

## 2015-12-03 DIAGNOSIS — M545 Low back pain: Secondary | ICD-10-CM | POA: Diagnosis not present

## 2015-12-03 DIAGNOSIS — G300 Alzheimer's disease with early onset: Secondary | ICD-10-CM | POA: Diagnosis not present

## 2015-12-04 DIAGNOSIS — G300 Alzheimer's disease with early onset: Secondary | ICD-10-CM | POA: Diagnosis not present

## 2015-12-05 DIAGNOSIS — G300 Alzheimer's disease with early onset: Secondary | ICD-10-CM | POA: Diagnosis not present

## 2015-12-06 DIAGNOSIS — G300 Alzheimer's disease with early onset: Secondary | ICD-10-CM | POA: Diagnosis not present

## 2015-12-07 DIAGNOSIS — G300 Alzheimer's disease with early onset: Secondary | ICD-10-CM | POA: Diagnosis not present

## 2015-12-08 DIAGNOSIS — G300 Alzheimer's disease with early onset: Secondary | ICD-10-CM | POA: Diagnosis not present

## 2015-12-09 DIAGNOSIS — G300 Alzheimer's disease with early onset: Secondary | ICD-10-CM | POA: Diagnosis not present

## 2015-12-10 DIAGNOSIS — G309 Alzheimer's disease, unspecified: Secondary | ICD-10-CM | POA: Diagnosis not present

## 2015-12-10 DIAGNOSIS — J309 Allergic rhinitis, unspecified: Secondary | ICD-10-CM | POA: Diagnosis not present

## 2015-12-10 DIAGNOSIS — F0280 Dementia in other diseases classified elsewhere without behavioral disturbance: Secondary | ICD-10-CM | POA: Diagnosis not present

## 2015-12-10 DIAGNOSIS — N390 Urinary tract infection, site not specified: Secondary | ICD-10-CM | POA: Diagnosis not present

## 2015-12-10 DIAGNOSIS — W050XXA Fall from non-moving wheelchair, initial encounter: Secondary | ICD-10-CM | POA: Diagnosis not present

## 2015-12-10 DIAGNOSIS — R531 Weakness: Secondary | ICD-10-CM | POA: Diagnosis not present

## 2015-12-10 DIAGNOSIS — R404 Transient alteration of awareness: Secondary | ICD-10-CM | POA: Diagnosis not present

## 2015-12-10 DIAGNOSIS — I10 Essential (primary) hypertension: Secondary | ICD-10-CM | POA: Diagnosis not present

## 2015-12-10 DIAGNOSIS — R402411 Glasgow coma scale score 13-15, in the field [EMT or ambulance]: Secondary | ICD-10-CM | POA: Diagnosis not present

## 2015-12-10 DIAGNOSIS — M810 Age-related osteoporosis without current pathological fracture: Secondary | ICD-10-CM | POA: Diagnosis not present

## 2015-12-10 DIAGNOSIS — R4182 Altered mental status, unspecified: Secondary | ICD-10-CM | POA: Diagnosis not present

## 2015-12-10 DIAGNOSIS — M353 Polymyalgia rheumatica: Secondary | ICD-10-CM | POA: Diagnosis not present

## 2015-12-10 DIAGNOSIS — K219 Gastro-esophageal reflux disease without esophagitis: Secondary | ICD-10-CM | POA: Diagnosis not present

## 2015-12-10 DIAGNOSIS — S3992XA Unspecified injury of lower back, initial encounter: Secondary | ICD-10-CM | POA: Diagnosis not present

## 2015-12-11 DIAGNOSIS — R4182 Altered mental status, unspecified: Secondary | ICD-10-CM | POA: Diagnosis not present

## 2015-12-11 DIAGNOSIS — S3992XA Unspecified injury of lower back, initial encounter: Secondary | ICD-10-CM | POA: Diagnosis not present

## 2015-12-12 DIAGNOSIS — G300 Alzheimer's disease with early onset: Secondary | ICD-10-CM | POA: Diagnosis not present

## 2015-12-12 DIAGNOSIS — R4182 Altered mental status, unspecified: Secondary | ICD-10-CM | POA: Diagnosis not present

## 2015-12-13 DIAGNOSIS — G300 Alzheimer's disease with early onset: Secondary | ICD-10-CM | POA: Diagnosis not present

## 2015-12-14 DIAGNOSIS — G300 Alzheimer's disease with early onset: Secondary | ICD-10-CM | POA: Diagnosis not present

## 2015-12-15 DIAGNOSIS — G300 Alzheimer's disease with early onset: Secondary | ICD-10-CM | POA: Diagnosis not present

## 2015-12-16 DIAGNOSIS — G300 Alzheimer's disease with early onset: Secondary | ICD-10-CM | POA: Diagnosis not present

## 2015-12-17 DIAGNOSIS — G300 Alzheimer's disease with early onset: Secondary | ICD-10-CM | POA: Diagnosis not present

## 2015-12-18 DIAGNOSIS — F0280 Dementia in other diseases classified elsewhere without behavioral disturbance: Secondary | ICD-10-CM | POA: Diagnosis not present

## 2015-12-18 DIAGNOSIS — M47896 Other spondylosis, lumbar region: Secondary | ICD-10-CM | POA: Diagnosis not present

## 2015-12-18 DIAGNOSIS — G300 Alzheimer's disease with early onset: Secondary | ICD-10-CM | POA: Diagnosis not present

## 2015-12-18 DIAGNOSIS — R260 Ataxic gait: Secondary | ICD-10-CM | POA: Diagnosis not present

## 2015-12-18 DIAGNOSIS — N390 Urinary tract infection, site not specified: Secondary | ICD-10-CM | POA: Diagnosis not present

## 2015-12-18 DIAGNOSIS — G309 Alzheimer's disease, unspecified: Secondary | ICD-10-CM | POA: Diagnosis not present

## 2015-12-18 DIAGNOSIS — M353 Polymyalgia rheumatica: Secondary | ICD-10-CM | POA: Diagnosis not present

## 2015-12-18 DIAGNOSIS — F329 Major depressive disorder, single episode, unspecified: Secondary | ICD-10-CM | POA: Diagnosis not present

## 2015-12-18 DIAGNOSIS — K580 Irritable bowel syndrome with diarrhea: Secondary | ICD-10-CM | POA: Diagnosis not present

## 2015-12-18 DIAGNOSIS — M810 Age-related osteoporosis without current pathological fracture: Secondary | ICD-10-CM | POA: Diagnosis not present

## 2015-12-19 DIAGNOSIS — G300 Alzheimer's disease with early onset: Secondary | ICD-10-CM | POA: Diagnosis not present

## 2015-12-20 DIAGNOSIS — K580 Irritable bowel syndrome with diarrhea: Secondary | ICD-10-CM | POA: Diagnosis not present

## 2015-12-20 DIAGNOSIS — M47896 Other spondylosis, lumbar region: Secondary | ICD-10-CM | POA: Diagnosis not present

## 2015-12-20 DIAGNOSIS — N390 Urinary tract infection, site not specified: Secondary | ICD-10-CM | POA: Diagnosis not present

## 2015-12-20 DIAGNOSIS — G300 Alzheimer's disease with early onset: Secondary | ICD-10-CM | POA: Diagnosis not present

## 2015-12-20 DIAGNOSIS — R260 Ataxic gait: Secondary | ICD-10-CM | POA: Diagnosis not present

## 2015-12-20 DIAGNOSIS — F329 Major depressive disorder, single episode, unspecified: Secondary | ICD-10-CM | POA: Diagnosis not present

## 2015-12-20 DIAGNOSIS — F0280 Dementia in other diseases classified elsewhere without behavioral disturbance: Secondary | ICD-10-CM | POA: Diagnosis not present

## 2015-12-20 DIAGNOSIS — M353 Polymyalgia rheumatica: Secondary | ICD-10-CM | POA: Diagnosis not present

## 2015-12-20 DIAGNOSIS — M810 Age-related osteoporosis without current pathological fracture: Secondary | ICD-10-CM | POA: Diagnosis not present

## 2015-12-20 DIAGNOSIS — G309 Alzheimer's disease, unspecified: Secondary | ICD-10-CM | POA: Diagnosis not present

## 2015-12-21 DIAGNOSIS — G300 Alzheimer's disease with early onset: Secondary | ICD-10-CM | POA: Diagnosis not present

## 2015-12-22 DIAGNOSIS — G300 Alzheimer's disease with early onset: Secondary | ICD-10-CM | POA: Diagnosis not present

## 2015-12-23 DIAGNOSIS — R260 Ataxic gait: Secondary | ICD-10-CM | POA: Diagnosis not present

## 2015-12-23 DIAGNOSIS — G300 Alzheimer's disease with early onset: Secondary | ICD-10-CM | POA: Diagnosis not present

## 2015-12-23 DIAGNOSIS — N390 Urinary tract infection, site not specified: Secondary | ICD-10-CM | POA: Diagnosis not present

## 2015-12-23 DIAGNOSIS — F329 Major depressive disorder, single episode, unspecified: Secondary | ICD-10-CM | POA: Diagnosis not present

## 2015-12-23 DIAGNOSIS — M353 Polymyalgia rheumatica: Secondary | ICD-10-CM | POA: Diagnosis not present

## 2015-12-23 DIAGNOSIS — K580 Irritable bowel syndrome with diarrhea: Secondary | ICD-10-CM | POA: Diagnosis not present

## 2015-12-23 DIAGNOSIS — M47896 Other spondylosis, lumbar region: Secondary | ICD-10-CM | POA: Diagnosis not present

## 2015-12-23 DIAGNOSIS — M810 Age-related osteoporosis without current pathological fracture: Secondary | ICD-10-CM | POA: Diagnosis not present

## 2015-12-23 DIAGNOSIS — G309 Alzheimer's disease, unspecified: Secondary | ICD-10-CM | POA: Diagnosis not present

## 2015-12-23 DIAGNOSIS — F0280 Dementia in other diseases classified elsewhere without behavioral disturbance: Secondary | ICD-10-CM | POA: Diagnosis not present

## 2015-12-24 DIAGNOSIS — M47896 Other spondylosis, lumbar region: Secondary | ICD-10-CM | POA: Diagnosis not present

## 2015-12-24 DIAGNOSIS — M353 Polymyalgia rheumatica: Secondary | ICD-10-CM | POA: Diagnosis not present

## 2015-12-24 DIAGNOSIS — K580 Irritable bowel syndrome with diarrhea: Secondary | ICD-10-CM | POA: Diagnosis not present

## 2015-12-24 DIAGNOSIS — N390 Urinary tract infection, site not specified: Secondary | ICD-10-CM | POA: Diagnosis not present

## 2015-12-24 DIAGNOSIS — M810 Age-related osteoporosis without current pathological fracture: Secondary | ICD-10-CM | POA: Diagnosis not present

## 2015-12-24 DIAGNOSIS — G300 Alzheimer's disease with early onset: Secondary | ICD-10-CM | POA: Diagnosis not present

## 2015-12-24 DIAGNOSIS — G309 Alzheimer's disease, unspecified: Secondary | ICD-10-CM | POA: Diagnosis not present

## 2015-12-24 DIAGNOSIS — R260 Ataxic gait: Secondary | ICD-10-CM | POA: Diagnosis not present

## 2015-12-24 DIAGNOSIS — F329 Major depressive disorder, single episode, unspecified: Secondary | ICD-10-CM | POA: Diagnosis not present

## 2015-12-24 DIAGNOSIS — F0280 Dementia in other diseases classified elsewhere without behavioral disturbance: Secondary | ICD-10-CM | POA: Diagnosis not present

## 2015-12-25 DIAGNOSIS — N390 Urinary tract infection, site not specified: Secondary | ICD-10-CM | POA: Diagnosis not present

## 2015-12-25 DIAGNOSIS — M810 Age-related osteoporosis without current pathological fracture: Secondary | ICD-10-CM | POA: Diagnosis not present

## 2015-12-25 DIAGNOSIS — K580 Irritable bowel syndrome with diarrhea: Secondary | ICD-10-CM | POA: Diagnosis not present

## 2015-12-25 DIAGNOSIS — G309 Alzheimer's disease, unspecified: Secondary | ICD-10-CM | POA: Diagnosis not present

## 2015-12-25 DIAGNOSIS — F0280 Dementia in other diseases classified elsewhere without behavioral disturbance: Secondary | ICD-10-CM | POA: Diagnosis not present

## 2015-12-25 DIAGNOSIS — M353 Polymyalgia rheumatica: Secondary | ICD-10-CM | POA: Diagnosis not present

## 2015-12-25 DIAGNOSIS — R260 Ataxic gait: Secondary | ICD-10-CM | POA: Diagnosis not present

## 2015-12-25 DIAGNOSIS — M47896 Other spondylosis, lumbar region: Secondary | ICD-10-CM | POA: Diagnosis not present

## 2015-12-25 DIAGNOSIS — F329 Major depressive disorder, single episode, unspecified: Secondary | ICD-10-CM | POA: Diagnosis not present

## 2015-12-25 DIAGNOSIS — G300 Alzheimer's disease with early onset: Secondary | ICD-10-CM | POA: Diagnosis not present

## 2015-12-26 DIAGNOSIS — K580 Irritable bowel syndrome with diarrhea: Secondary | ICD-10-CM | POA: Diagnosis not present

## 2015-12-26 DIAGNOSIS — F0280 Dementia in other diseases classified elsewhere without behavioral disturbance: Secondary | ICD-10-CM | POA: Diagnosis not present

## 2015-12-26 DIAGNOSIS — F329 Major depressive disorder, single episode, unspecified: Secondary | ICD-10-CM | POA: Diagnosis not present

## 2015-12-26 DIAGNOSIS — R260 Ataxic gait: Secondary | ICD-10-CM | POA: Diagnosis not present

## 2015-12-26 DIAGNOSIS — G300 Alzheimer's disease with early onset: Secondary | ICD-10-CM | POA: Diagnosis not present

## 2015-12-26 DIAGNOSIS — G309 Alzheimer's disease, unspecified: Secondary | ICD-10-CM | POA: Diagnosis not present

## 2015-12-26 DIAGNOSIS — M47896 Other spondylosis, lumbar region: Secondary | ICD-10-CM | POA: Diagnosis not present

## 2015-12-26 DIAGNOSIS — N390 Urinary tract infection, site not specified: Secondary | ICD-10-CM | POA: Diagnosis not present

## 2015-12-26 DIAGNOSIS — M810 Age-related osteoporosis without current pathological fracture: Secondary | ICD-10-CM | POA: Diagnosis not present

## 2015-12-26 DIAGNOSIS — M353 Polymyalgia rheumatica: Secondary | ICD-10-CM | POA: Diagnosis not present

## 2015-12-27 DIAGNOSIS — G300 Alzheimer's disease with early onset: Secondary | ICD-10-CM | POA: Diagnosis not present

## 2015-12-28 DIAGNOSIS — G300 Alzheimer's disease with early onset: Secondary | ICD-10-CM | POA: Diagnosis not present

## 2015-12-29 DIAGNOSIS — G300 Alzheimer's disease with early onset: Secondary | ICD-10-CM | POA: Diagnosis not present

## 2015-12-30 DIAGNOSIS — K580 Irritable bowel syndrome with diarrhea: Secondary | ICD-10-CM | POA: Diagnosis not present

## 2015-12-30 DIAGNOSIS — F0280 Dementia in other diseases classified elsewhere without behavioral disturbance: Secondary | ICD-10-CM | POA: Diagnosis not present

## 2015-12-30 DIAGNOSIS — G309 Alzheimer's disease, unspecified: Secondary | ICD-10-CM | POA: Diagnosis not present

## 2015-12-30 DIAGNOSIS — G300 Alzheimer's disease with early onset: Secondary | ICD-10-CM | POA: Diagnosis not present

## 2015-12-30 DIAGNOSIS — R509 Fever, unspecified: Secondary | ICD-10-CM | POA: Diagnosis not present

## 2015-12-30 DIAGNOSIS — M47896 Other spondylosis, lumbar region: Secondary | ICD-10-CM | POA: Diagnosis not present

## 2015-12-30 DIAGNOSIS — M810 Age-related osteoporosis without current pathological fracture: Secondary | ICD-10-CM | POA: Diagnosis not present

## 2015-12-30 DIAGNOSIS — M353 Polymyalgia rheumatica: Secondary | ICD-10-CM | POA: Diagnosis not present

## 2015-12-30 DIAGNOSIS — N390 Urinary tract infection, site not specified: Secondary | ICD-10-CM | POA: Diagnosis not present

## 2015-12-30 DIAGNOSIS — F329 Major depressive disorder, single episode, unspecified: Secondary | ICD-10-CM | POA: Diagnosis not present

## 2015-12-30 DIAGNOSIS — R260 Ataxic gait: Secondary | ICD-10-CM | POA: Diagnosis not present

## 2015-12-31 DIAGNOSIS — M353 Polymyalgia rheumatica: Secondary | ICD-10-CM | POA: Diagnosis not present

## 2015-12-31 DIAGNOSIS — K580 Irritable bowel syndrome with diarrhea: Secondary | ICD-10-CM | POA: Diagnosis not present

## 2015-12-31 DIAGNOSIS — G309 Alzheimer's disease, unspecified: Secondary | ICD-10-CM | POA: Diagnosis not present

## 2015-12-31 DIAGNOSIS — R260 Ataxic gait: Secondary | ICD-10-CM | POA: Diagnosis not present

## 2015-12-31 DIAGNOSIS — G300 Alzheimer's disease with early onset: Secondary | ICD-10-CM | POA: Diagnosis not present

## 2015-12-31 DIAGNOSIS — F329 Major depressive disorder, single episode, unspecified: Secondary | ICD-10-CM | POA: Diagnosis not present

## 2015-12-31 DIAGNOSIS — F0280 Dementia in other diseases classified elsewhere without behavioral disturbance: Secondary | ICD-10-CM | POA: Diagnosis not present

## 2015-12-31 DIAGNOSIS — M810 Age-related osteoporosis without current pathological fracture: Secondary | ICD-10-CM | POA: Diagnosis not present

## 2015-12-31 DIAGNOSIS — M47896 Other spondylosis, lumbar region: Secondary | ICD-10-CM | POA: Diagnosis not present

## 2015-12-31 DIAGNOSIS — N390 Urinary tract infection, site not specified: Secondary | ICD-10-CM | POA: Diagnosis not present

## 2016-01-02 DIAGNOSIS — M810 Age-related osteoporosis without current pathological fracture: Secondary | ICD-10-CM | POA: Diagnosis not present

## 2016-01-02 DIAGNOSIS — M353 Polymyalgia rheumatica: Secondary | ICD-10-CM | POA: Diagnosis not present

## 2016-01-02 DIAGNOSIS — R260 Ataxic gait: Secondary | ICD-10-CM | POA: Diagnosis not present

## 2016-01-02 DIAGNOSIS — F329 Major depressive disorder, single episode, unspecified: Secondary | ICD-10-CM | POA: Diagnosis not present

## 2016-01-02 DIAGNOSIS — M47896 Other spondylosis, lumbar region: Secondary | ICD-10-CM | POA: Diagnosis not present

## 2016-01-02 DIAGNOSIS — F0280 Dementia in other diseases classified elsewhere without behavioral disturbance: Secondary | ICD-10-CM | POA: Diagnosis not present

## 2016-01-02 DIAGNOSIS — G309 Alzheimer's disease, unspecified: Secondary | ICD-10-CM | POA: Diagnosis not present

## 2016-01-02 DIAGNOSIS — N390 Urinary tract infection, site not specified: Secondary | ICD-10-CM | POA: Diagnosis not present

## 2016-01-02 DIAGNOSIS — K580 Irritable bowel syndrome with diarrhea: Secondary | ICD-10-CM | POA: Diagnosis not present

## 2016-01-07 DIAGNOSIS — K580 Irritable bowel syndrome with diarrhea: Secondary | ICD-10-CM | POA: Diagnosis not present

## 2016-01-07 DIAGNOSIS — R260 Ataxic gait: Secondary | ICD-10-CM | POA: Diagnosis not present

## 2016-01-07 DIAGNOSIS — G309 Alzheimer's disease, unspecified: Secondary | ICD-10-CM | POA: Diagnosis not present

## 2016-01-07 DIAGNOSIS — M47896 Other spondylosis, lumbar region: Secondary | ICD-10-CM | POA: Diagnosis not present

## 2016-01-07 DIAGNOSIS — F329 Major depressive disorder, single episode, unspecified: Secondary | ICD-10-CM | POA: Diagnosis not present

## 2016-01-07 DIAGNOSIS — M810 Age-related osteoporosis without current pathological fracture: Secondary | ICD-10-CM | POA: Diagnosis not present

## 2016-01-07 DIAGNOSIS — N390 Urinary tract infection, site not specified: Secondary | ICD-10-CM | POA: Diagnosis not present

## 2016-01-07 DIAGNOSIS — F0280 Dementia in other diseases classified elsewhere without behavioral disturbance: Secondary | ICD-10-CM | POA: Diagnosis not present

## 2016-01-07 DIAGNOSIS — M353 Polymyalgia rheumatica: Secondary | ICD-10-CM | POA: Diagnosis not present

## 2016-01-08 DIAGNOSIS — F0280 Dementia in other diseases classified elsewhere without behavioral disturbance: Secondary | ICD-10-CM | POA: Diagnosis not present

## 2016-01-08 DIAGNOSIS — R260 Ataxic gait: Secondary | ICD-10-CM | POA: Diagnosis not present

## 2016-01-08 DIAGNOSIS — M810 Age-related osteoporosis without current pathological fracture: Secondary | ICD-10-CM | POA: Diagnosis not present

## 2016-01-08 DIAGNOSIS — F329 Major depressive disorder, single episode, unspecified: Secondary | ICD-10-CM | POA: Diagnosis not present

## 2016-01-08 DIAGNOSIS — M353 Polymyalgia rheumatica: Secondary | ICD-10-CM | POA: Diagnosis not present

## 2016-01-08 DIAGNOSIS — G309 Alzheimer's disease, unspecified: Secondary | ICD-10-CM | POA: Diagnosis not present

## 2016-01-08 DIAGNOSIS — N390 Urinary tract infection, site not specified: Secondary | ICD-10-CM | POA: Diagnosis not present

## 2016-01-08 DIAGNOSIS — K580 Irritable bowel syndrome with diarrhea: Secondary | ICD-10-CM | POA: Diagnosis not present

## 2016-01-08 DIAGNOSIS — M47896 Other spondylosis, lumbar region: Secondary | ICD-10-CM | POA: Diagnosis not present

## 2016-01-09 DIAGNOSIS — F0280 Dementia in other diseases classified elsewhere without behavioral disturbance: Secondary | ICD-10-CM | POA: Diagnosis not present

## 2016-01-09 DIAGNOSIS — R260 Ataxic gait: Secondary | ICD-10-CM | POA: Diagnosis not present

## 2016-01-09 DIAGNOSIS — M810 Age-related osteoporosis without current pathological fracture: Secondary | ICD-10-CM | POA: Diagnosis not present

## 2016-01-09 DIAGNOSIS — M353 Polymyalgia rheumatica: Secondary | ICD-10-CM | POA: Diagnosis not present

## 2016-01-09 DIAGNOSIS — F329 Major depressive disorder, single episode, unspecified: Secondary | ICD-10-CM | POA: Diagnosis not present

## 2016-01-09 DIAGNOSIS — M47896 Other spondylosis, lumbar region: Secondary | ICD-10-CM | POA: Diagnosis not present

## 2016-01-09 DIAGNOSIS — N390 Urinary tract infection, site not specified: Secondary | ICD-10-CM | POA: Diagnosis not present

## 2016-01-09 DIAGNOSIS — K580 Irritable bowel syndrome with diarrhea: Secondary | ICD-10-CM | POA: Diagnosis not present

## 2016-01-09 DIAGNOSIS — G309 Alzheimer's disease, unspecified: Secondary | ICD-10-CM | POA: Diagnosis not present

## 2016-01-11 DIAGNOSIS — G301 Alzheimer's disease with late onset: Secondary | ICD-10-CM | POA: Diagnosis not present

## 2016-01-11 DIAGNOSIS — F331 Major depressive disorder, recurrent, moderate: Secondary | ICD-10-CM | POA: Diagnosis not present

## 2016-01-13 DIAGNOSIS — M810 Age-related osteoporosis without current pathological fracture: Secondary | ICD-10-CM | POA: Diagnosis not present

## 2016-01-13 DIAGNOSIS — K580 Irritable bowel syndrome with diarrhea: Secondary | ICD-10-CM | POA: Diagnosis not present

## 2016-01-13 DIAGNOSIS — M47896 Other spondylosis, lumbar region: Secondary | ICD-10-CM | POA: Diagnosis not present

## 2016-01-13 DIAGNOSIS — F0280 Dementia in other diseases classified elsewhere without behavioral disturbance: Secondary | ICD-10-CM | POA: Diagnosis not present

## 2016-01-13 DIAGNOSIS — R260 Ataxic gait: Secondary | ICD-10-CM | POA: Diagnosis not present

## 2016-01-13 DIAGNOSIS — R410 Disorientation, unspecified: Secondary | ICD-10-CM | POA: Diagnosis not present

## 2016-01-13 DIAGNOSIS — M353 Polymyalgia rheumatica: Secondary | ICD-10-CM | POA: Diagnosis not present

## 2016-01-13 DIAGNOSIS — G309 Alzheimer's disease, unspecified: Secondary | ICD-10-CM | POA: Diagnosis not present

## 2016-01-13 DIAGNOSIS — N390 Urinary tract infection, site not specified: Secondary | ICD-10-CM | POA: Diagnosis not present

## 2016-01-13 DIAGNOSIS — F329 Major depressive disorder, single episode, unspecified: Secondary | ICD-10-CM | POA: Diagnosis not present

## 2016-01-14 DIAGNOSIS — N390 Urinary tract infection, site not specified: Secondary | ICD-10-CM | POA: Diagnosis not present

## 2016-01-14 DIAGNOSIS — G309 Alzheimer's disease, unspecified: Secondary | ICD-10-CM | POA: Diagnosis not present

## 2016-01-14 DIAGNOSIS — M353 Polymyalgia rheumatica: Secondary | ICD-10-CM | POA: Diagnosis not present

## 2016-01-14 DIAGNOSIS — M810 Age-related osteoporosis without current pathological fracture: Secondary | ICD-10-CM | POA: Diagnosis not present

## 2016-01-14 DIAGNOSIS — R260 Ataxic gait: Secondary | ICD-10-CM | POA: Diagnosis not present

## 2016-01-14 DIAGNOSIS — K580 Irritable bowel syndrome with diarrhea: Secondary | ICD-10-CM | POA: Diagnosis not present

## 2016-01-14 DIAGNOSIS — F0280 Dementia in other diseases classified elsewhere without behavioral disturbance: Secondary | ICD-10-CM | POA: Diagnosis not present

## 2016-01-14 DIAGNOSIS — F329 Major depressive disorder, single episode, unspecified: Secondary | ICD-10-CM | POA: Diagnosis not present

## 2016-01-14 DIAGNOSIS — M47896 Other spondylosis, lumbar region: Secondary | ICD-10-CM | POA: Diagnosis not present

## 2016-01-17 DIAGNOSIS — K580 Irritable bowel syndrome with diarrhea: Secondary | ICD-10-CM | POA: Diagnosis not present

## 2016-01-17 DIAGNOSIS — M810 Age-related osteoporosis without current pathological fracture: Secondary | ICD-10-CM | POA: Diagnosis not present

## 2016-01-17 DIAGNOSIS — N390 Urinary tract infection, site not specified: Secondary | ICD-10-CM | POA: Diagnosis not present

## 2016-01-17 DIAGNOSIS — F329 Major depressive disorder, single episode, unspecified: Secondary | ICD-10-CM | POA: Diagnosis not present

## 2016-01-17 DIAGNOSIS — G309 Alzheimer's disease, unspecified: Secondary | ICD-10-CM | POA: Diagnosis not present

## 2016-01-17 DIAGNOSIS — F0280 Dementia in other diseases classified elsewhere without behavioral disturbance: Secondary | ICD-10-CM | POA: Diagnosis not present

## 2016-01-17 DIAGNOSIS — M47896 Other spondylosis, lumbar region: Secondary | ICD-10-CM | POA: Diagnosis not present

## 2016-01-17 DIAGNOSIS — M353 Polymyalgia rheumatica: Secondary | ICD-10-CM | POA: Diagnosis not present

## 2016-01-17 DIAGNOSIS — R260 Ataxic gait: Secondary | ICD-10-CM | POA: Diagnosis not present

## 2016-01-21 DIAGNOSIS — N390 Urinary tract infection, site not specified: Secondary | ICD-10-CM | POA: Diagnosis not present

## 2016-01-21 DIAGNOSIS — F329 Major depressive disorder, single episode, unspecified: Secondary | ICD-10-CM | POA: Diagnosis not present

## 2016-01-21 DIAGNOSIS — M47896 Other spondylosis, lumbar region: Secondary | ICD-10-CM | POA: Diagnosis not present

## 2016-01-21 DIAGNOSIS — G309 Alzheimer's disease, unspecified: Secondary | ICD-10-CM | POA: Diagnosis not present

## 2016-01-21 DIAGNOSIS — R260 Ataxic gait: Secondary | ICD-10-CM | POA: Diagnosis not present

## 2016-01-21 DIAGNOSIS — K580 Irritable bowel syndrome with diarrhea: Secondary | ICD-10-CM | POA: Diagnosis not present

## 2016-01-21 DIAGNOSIS — M810 Age-related osteoporosis without current pathological fracture: Secondary | ICD-10-CM | POA: Diagnosis not present

## 2016-01-21 DIAGNOSIS — F0280 Dementia in other diseases classified elsewhere without behavioral disturbance: Secondary | ICD-10-CM | POA: Diagnosis not present

## 2016-01-21 DIAGNOSIS — M353 Polymyalgia rheumatica: Secondary | ICD-10-CM | POA: Diagnosis not present

## 2016-01-24 DIAGNOSIS — M47896 Other spondylosis, lumbar region: Secondary | ICD-10-CM | POA: Diagnosis not present

## 2016-01-24 DIAGNOSIS — G309 Alzheimer's disease, unspecified: Secondary | ICD-10-CM | POA: Diagnosis not present

## 2016-01-24 DIAGNOSIS — R260 Ataxic gait: Secondary | ICD-10-CM | POA: Diagnosis not present

## 2016-01-24 DIAGNOSIS — M810 Age-related osteoporosis without current pathological fracture: Secondary | ICD-10-CM | POA: Diagnosis not present

## 2016-01-24 DIAGNOSIS — F0280 Dementia in other diseases classified elsewhere without behavioral disturbance: Secondary | ICD-10-CM | POA: Diagnosis not present

## 2016-01-24 DIAGNOSIS — F329 Major depressive disorder, single episode, unspecified: Secondary | ICD-10-CM | POA: Diagnosis not present

## 2016-01-24 DIAGNOSIS — M353 Polymyalgia rheumatica: Secondary | ICD-10-CM | POA: Diagnosis not present

## 2016-01-24 DIAGNOSIS — K580 Irritable bowel syndrome with diarrhea: Secondary | ICD-10-CM | POA: Diagnosis not present

## 2016-01-24 DIAGNOSIS — N390 Urinary tract infection, site not specified: Secondary | ICD-10-CM | POA: Diagnosis not present

## 2016-01-29 DIAGNOSIS — G300 Alzheimer's disease with early onset: Secondary | ICD-10-CM | POA: Diagnosis not present

## 2016-01-30 DIAGNOSIS — G300 Alzheimer's disease with early onset: Secondary | ICD-10-CM | POA: Diagnosis not present

## 2016-01-31 DIAGNOSIS — M353 Polymyalgia rheumatica: Secondary | ICD-10-CM | POA: Diagnosis not present

## 2016-01-31 DIAGNOSIS — F0280 Dementia in other diseases classified elsewhere without behavioral disturbance: Secondary | ICD-10-CM | POA: Diagnosis not present

## 2016-01-31 DIAGNOSIS — M810 Age-related osteoporosis without current pathological fracture: Secondary | ICD-10-CM | POA: Diagnosis not present

## 2016-01-31 DIAGNOSIS — G309 Alzheimer's disease, unspecified: Secondary | ICD-10-CM | POA: Diagnosis not present

## 2016-01-31 DIAGNOSIS — R260 Ataxic gait: Secondary | ICD-10-CM | POA: Diagnosis not present

## 2016-01-31 DIAGNOSIS — G300 Alzheimer's disease with early onset: Secondary | ICD-10-CM | POA: Diagnosis not present

## 2016-01-31 DIAGNOSIS — F329 Major depressive disorder, single episode, unspecified: Secondary | ICD-10-CM | POA: Diagnosis not present

## 2016-01-31 DIAGNOSIS — M47896 Other spondylosis, lumbar region: Secondary | ICD-10-CM | POA: Diagnosis not present

## 2016-01-31 DIAGNOSIS — K580 Irritable bowel syndrome with diarrhea: Secondary | ICD-10-CM | POA: Diagnosis not present

## 2016-01-31 DIAGNOSIS — N390 Urinary tract infection, site not specified: Secondary | ICD-10-CM | POA: Diagnosis not present

## 2016-02-01 DIAGNOSIS — G300 Alzheimer's disease with early onset: Secondary | ICD-10-CM | POA: Diagnosis not present

## 2016-02-02 DIAGNOSIS — G300 Alzheimer's disease with early onset: Secondary | ICD-10-CM | POA: Diagnosis not present

## 2016-02-03 DIAGNOSIS — G300 Alzheimer's disease with early onset: Secondary | ICD-10-CM | POA: Diagnosis not present

## 2016-02-04 DIAGNOSIS — G300 Alzheimer's disease with early onset: Secondary | ICD-10-CM | POA: Diagnosis not present

## 2016-02-05 DIAGNOSIS — G300 Alzheimer's disease with early onset: Secondary | ICD-10-CM | POA: Diagnosis not present

## 2016-02-05 DIAGNOSIS — F329 Major depressive disorder, single episode, unspecified: Secondary | ICD-10-CM | POA: Diagnosis not present

## 2016-02-05 DIAGNOSIS — N390 Urinary tract infection, site not specified: Secondary | ICD-10-CM | POA: Diagnosis not present

## 2016-02-05 DIAGNOSIS — K580 Irritable bowel syndrome with diarrhea: Secondary | ICD-10-CM | POA: Diagnosis not present

## 2016-02-05 DIAGNOSIS — R260 Ataxic gait: Secondary | ICD-10-CM | POA: Diagnosis not present

## 2016-02-05 DIAGNOSIS — M47896 Other spondylosis, lumbar region: Secondary | ICD-10-CM | POA: Diagnosis not present

## 2016-02-05 DIAGNOSIS — M353 Polymyalgia rheumatica: Secondary | ICD-10-CM | POA: Diagnosis not present

## 2016-02-05 DIAGNOSIS — F0280 Dementia in other diseases classified elsewhere without behavioral disturbance: Secondary | ICD-10-CM | POA: Diagnosis not present

## 2016-02-05 DIAGNOSIS — M810 Age-related osteoporosis without current pathological fracture: Secondary | ICD-10-CM | POA: Diagnosis not present

## 2016-02-05 DIAGNOSIS — G309 Alzheimer's disease, unspecified: Secondary | ICD-10-CM | POA: Diagnosis not present

## 2016-02-06 DIAGNOSIS — G300 Alzheimer's disease with early onset: Secondary | ICD-10-CM | POA: Diagnosis not present

## 2016-02-07 DIAGNOSIS — G300 Alzheimer's disease with early onset: Secondary | ICD-10-CM | POA: Diagnosis not present

## 2016-02-08 DIAGNOSIS — G300 Alzheimer's disease with early onset: Secondary | ICD-10-CM | POA: Diagnosis not present

## 2016-02-09 DIAGNOSIS — G300 Alzheimer's disease with early onset: Secondary | ICD-10-CM | POA: Diagnosis not present

## 2016-02-10 DIAGNOSIS — G300 Alzheimer's disease with early onset: Secondary | ICD-10-CM | POA: Diagnosis not present

## 2016-02-11 DIAGNOSIS — R260 Ataxic gait: Secondary | ICD-10-CM | POA: Diagnosis not present

## 2016-02-11 DIAGNOSIS — F0280 Dementia in other diseases classified elsewhere without behavioral disturbance: Secondary | ICD-10-CM | POA: Diagnosis not present

## 2016-02-11 DIAGNOSIS — K580 Irritable bowel syndrome with diarrhea: Secondary | ICD-10-CM | POA: Diagnosis not present

## 2016-02-11 DIAGNOSIS — F329 Major depressive disorder, single episode, unspecified: Secondary | ICD-10-CM | POA: Diagnosis not present

## 2016-02-11 DIAGNOSIS — M47896 Other spondylosis, lumbar region: Secondary | ICD-10-CM | POA: Diagnosis not present

## 2016-02-11 DIAGNOSIS — N390 Urinary tract infection, site not specified: Secondary | ICD-10-CM | POA: Diagnosis not present

## 2016-02-11 DIAGNOSIS — M810 Age-related osteoporosis without current pathological fracture: Secondary | ICD-10-CM | POA: Diagnosis not present

## 2016-02-11 DIAGNOSIS — M353 Polymyalgia rheumatica: Secondary | ICD-10-CM | POA: Diagnosis not present

## 2016-02-11 DIAGNOSIS — G309 Alzheimer's disease, unspecified: Secondary | ICD-10-CM | POA: Diagnosis not present

## 2016-02-11 DIAGNOSIS — G300 Alzheimer's disease with early onset: Secondary | ICD-10-CM | POA: Diagnosis not present

## 2016-02-12 DIAGNOSIS — G300 Alzheimer's disease with early onset: Secondary | ICD-10-CM | POA: Diagnosis not present

## 2016-02-13 DIAGNOSIS — G300 Alzheimer's disease with early onset: Secondary | ICD-10-CM | POA: Diagnosis not present

## 2016-02-14 DIAGNOSIS — G300 Alzheimer's disease with early onset: Secondary | ICD-10-CM | POA: Diagnosis not present

## 2016-02-15 DIAGNOSIS — G300 Alzheimer's disease with early onset: Secondary | ICD-10-CM | POA: Diagnosis not present

## 2016-02-16 DIAGNOSIS — G300 Alzheimer's disease with early onset: Secondary | ICD-10-CM | POA: Diagnosis not present

## 2016-02-17 DIAGNOSIS — G300 Alzheimer's disease with early onset: Secondary | ICD-10-CM | POA: Diagnosis not present

## 2016-02-18 DIAGNOSIS — G300 Alzheimer's disease with early onset: Secondary | ICD-10-CM | POA: Diagnosis not present

## 2016-02-19 DIAGNOSIS — G300 Alzheimer's disease with early onset: Secondary | ICD-10-CM | POA: Diagnosis not present

## 2016-02-20 DIAGNOSIS — G300 Alzheimer's disease with early onset: Secondary | ICD-10-CM | POA: Diagnosis not present

## 2016-02-21 DIAGNOSIS — G300 Alzheimer's disease with early onset: Secondary | ICD-10-CM | POA: Diagnosis not present

## 2016-02-22 DIAGNOSIS — G300 Alzheimer's disease with early onset: Secondary | ICD-10-CM | POA: Diagnosis not present

## 2016-02-23 DIAGNOSIS — G300 Alzheimer's disease with early onset: Secondary | ICD-10-CM | POA: Diagnosis not present

## 2016-02-24 DIAGNOSIS — G300 Alzheimer's disease with early onset: Secondary | ICD-10-CM | POA: Diagnosis not present

## 2016-02-25 DIAGNOSIS — G300 Alzheimer's disease with early onset: Secondary | ICD-10-CM | POA: Diagnosis not present

## 2016-02-26 DIAGNOSIS — G300 Alzheimer's disease with early onset: Secondary | ICD-10-CM | POA: Diagnosis not present

## 2016-02-27 DIAGNOSIS — G300 Alzheimer's disease with early onset: Secondary | ICD-10-CM | POA: Diagnosis not present

## 2016-02-28 DIAGNOSIS — G300 Alzheimer's disease with early onset: Secondary | ICD-10-CM | POA: Diagnosis not present

## 2016-02-29 DIAGNOSIS — G300 Alzheimer's disease with early onset: Secondary | ICD-10-CM | POA: Diagnosis not present

## 2016-03-01 DIAGNOSIS — G300 Alzheimer's disease with early onset: Secondary | ICD-10-CM | POA: Diagnosis not present

## 2016-03-02 DIAGNOSIS — G300 Alzheimer's disease with early onset: Secondary | ICD-10-CM | POA: Diagnosis not present

## 2016-03-03 DIAGNOSIS — G300 Alzheimer's disease with early onset: Secondary | ICD-10-CM | POA: Diagnosis not present

## 2016-03-04 DIAGNOSIS — G300 Alzheimer's disease with early onset: Secondary | ICD-10-CM | POA: Diagnosis not present

## 2016-03-05 DIAGNOSIS — G300 Alzheimer's disease with early onset: Secondary | ICD-10-CM | POA: Diagnosis not present

## 2016-03-06 DIAGNOSIS — G300 Alzheimer's disease with early onset: Secondary | ICD-10-CM | POA: Diagnosis not present

## 2016-03-07 DIAGNOSIS — G300 Alzheimer's disease with early onset: Secondary | ICD-10-CM | POA: Diagnosis not present

## 2016-03-08 DIAGNOSIS — G300 Alzheimer's disease with early onset: Secondary | ICD-10-CM | POA: Diagnosis not present

## 2016-03-09 DIAGNOSIS — G300 Alzheimer's disease with early onset: Secondary | ICD-10-CM | POA: Diagnosis not present

## 2016-03-10 DIAGNOSIS — G300 Alzheimer's disease with early onset: Secondary | ICD-10-CM | POA: Diagnosis not present

## 2016-03-11 DIAGNOSIS — G300 Alzheimer's disease with early onset: Secondary | ICD-10-CM | POA: Diagnosis not present

## 2016-03-12 DIAGNOSIS — G300 Alzheimer's disease with early onset: Secondary | ICD-10-CM | POA: Diagnosis not present

## 2016-03-13 DIAGNOSIS — G300 Alzheimer's disease with early onset: Secondary | ICD-10-CM | POA: Diagnosis not present

## 2016-03-14 DIAGNOSIS — G300 Alzheimer's disease with early onset: Secondary | ICD-10-CM | POA: Diagnosis not present

## 2016-03-15 DIAGNOSIS — G300 Alzheimer's disease with early onset: Secondary | ICD-10-CM | POA: Diagnosis not present

## 2016-03-16 DIAGNOSIS — G309 Alzheimer's disease, unspecified: Secondary | ICD-10-CM | POA: Diagnosis not present

## 2016-03-16 DIAGNOSIS — T148 Other injury of unspecified body region: Secondary | ICD-10-CM | POA: Diagnosis not present

## 2016-03-16 DIAGNOSIS — M25551 Pain in right hip: Secondary | ICD-10-CM | POA: Diagnosis not present

## 2016-03-16 DIAGNOSIS — G300 Alzheimer's disease with early onset: Secondary | ICD-10-CM | POA: Diagnosis not present

## 2016-03-16 DIAGNOSIS — Z7952 Long term (current) use of systemic steroids: Secondary | ICD-10-CM | POA: Diagnosis not present

## 2016-03-16 DIAGNOSIS — M1990 Unspecified osteoarthritis, unspecified site: Secondary | ICD-10-CM | POA: Diagnosis not present

## 2016-03-16 DIAGNOSIS — R51 Headache: Secondary | ICD-10-CM | POA: Diagnosis not present

## 2016-03-16 DIAGNOSIS — M150 Primary generalized (osteo)arthritis: Secondary | ICD-10-CM | POA: Diagnosis not present

## 2016-03-16 DIAGNOSIS — F0280 Dementia in other diseases classified elsewhere without behavioral disturbance: Secondary | ICD-10-CM | POA: Diagnosis not present

## 2016-03-16 DIAGNOSIS — M25552 Pain in left hip: Secondary | ICD-10-CM | POA: Diagnosis not present

## 2016-03-16 DIAGNOSIS — Z79899 Other long term (current) drug therapy: Secondary | ICD-10-CM | POA: Diagnosis not present

## 2016-03-16 DIAGNOSIS — M25559 Pain in unspecified hip: Secondary | ICD-10-CM | POA: Diagnosis not present

## 2016-03-16 DIAGNOSIS — I10 Essential (primary) hypertension: Secondary | ICD-10-CM | POA: Diagnosis not present

## 2016-03-17 DIAGNOSIS — G300 Alzheimer's disease with early onset: Secondary | ICD-10-CM | POA: Diagnosis not present

## 2016-03-18 DIAGNOSIS — G300 Alzheimer's disease with early onset: Secondary | ICD-10-CM | POA: Diagnosis not present

## 2016-03-19 DIAGNOSIS — G300 Alzheimer's disease with early onset: Secondary | ICD-10-CM | POA: Diagnosis not present

## 2016-03-20 DIAGNOSIS — G300 Alzheimer's disease with early onset: Secondary | ICD-10-CM | POA: Diagnosis not present

## 2016-03-21 DIAGNOSIS — G300 Alzheimer's disease with early onset: Secondary | ICD-10-CM | POA: Diagnosis not present

## 2016-03-22 DIAGNOSIS — G300 Alzheimer's disease with early onset: Secondary | ICD-10-CM | POA: Diagnosis not present

## 2016-03-23 DIAGNOSIS — G300 Alzheimer's disease with early onset: Secondary | ICD-10-CM | POA: Diagnosis not present

## 2016-03-24 DIAGNOSIS — G300 Alzheimer's disease with early onset: Secondary | ICD-10-CM | POA: Diagnosis not present

## 2016-03-25 DIAGNOSIS — G300 Alzheimer's disease with early onset: Secondary | ICD-10-CM | POA: Diagnosis not present

## 2016-03-26 DIAGNOSIS — G300 Alzheimer's disease with early onset: Secondary | ICD-10-CM | POA: Diagnosis not present

## 2016-03-27 DIAGNOSIS — G300 Alzheimer's disease with early onset: Secondary | ICD-10-CM | POA: Diagnosis not present

## 2016-03-28 DIAGNOSIS — G300 Alzheimer's disease with early onset: Secondary | ICD-10-CM | POA: Diagnosis not present

## 2016-03-29 DIAGNOSIS — G300 Alzheimer's disease with early onset: Secondary | ICD-10-CM | POA: Diagnosis not present

## 2016-03-30 DIAGNOSIS — G300 Alzheimer's disease with early onset: Secondary | ICD-10-CM | POA: Diagnosis not present

## 2016-03-31 DIAGNOSIS — G300 Alzheimer's disease with early onset: Secondary | ICD-10-CM | POA: Diagnosis not present

## 2016-04-01 DIAGNOSIS — G300 Alzheimer's disease with early onset: Secondary | ICD-10-CM | POA: Diagnosis not present

## 2016-04-02 DIAGNOSIS — G300 Alzheimer's disease with early onset: Secondary | ICD-10-CM | POA: Diagnosis not present

## 2016-04-03 DIAGNOSIS — G300 Alzheimer's disease with early onset: Secondary | ICD-10-CM | POA: Diagnosis not present

## 2016-04-04 DIAGNOSIS — G300 Alzheimer's disease with early onset: Secondary | ICD-10-CM | POA: Diagnosis not present

## 2016-04-05 DIAGNOSIS — G300 Alzheimer's disease with early onset: Secondary | ICD-10-CM | POA: Diagnosis not present

## 2016-04-06 DIAGNOSIS — G300 Alzheimer's disease with early onset: Secondary | ICD-10-CM | POA: Diagnosis not present

## 2016-04-07 DIAGNOSIS — G300 Alzheimer's disease with early onset: Secondary | ICD-10-CM | POA: Diagnosis not present

## 2016-04-08 DIAGNOSIS — G300 Alzheimer's disease with early onset: Secondary | ICD-10-CM | POA: Diagnosis not present

## 2016-04-09 DIAGNOSIS — E782 Mixed hyperlipidemia: Secondary | ICD-10-CM | POA: Diagnosis not present

## 2016-04-09 DIAGNOSIS — E6609 Other obesity due to excess calories: Secondary | ICD-10-CM | POA: Diagnosis not present

## 2016-04-09 DIAGNOSIS — K580 Irritable bowel syndrome with diarrhea: Secondary | ICD-10-CM | POA: Diagnosis not present

## 2016-04-09 DIAGNOSIS — G4733 Obstructive sleep apnea (adult) (pediatric): Secondary | ICD-10-CM | POA: Diagnosis not present

## 2016-04-09 DIAGNOSIS — E0580 Other thyrotoxicosis without thyrotoxic crisis or storm: Secondary | ICD-10-CM | POA: Diagnosis not present

## 2016-04-09 DIAGNOSIS — G300 Alzheimer's disease with early onset: Secondary | ICD-10-CM | POA: Diagnosis not present

## 2016-04-09 DIAGNOSIS — I10 Essential (primary) hypertension: Secondary | ICD-10-CM | POA: Diagnosis not present

## 2016-04-09 DIAGNOSIS — J301 Allergic rhinitis due to pollen: Secondary | ICD-10-CM | POA: Diagnosis not present

## 2016-04-10 DIAGNOSIS — G300 Alzheimer's disease with early onset: Secondary | ICD-10-CM | POA: Diagnosis not present

## 2016-04-11 DIAGNOSIS — G300 Alzheimer's disease with early onset: Secondary | ICD-10-CM | POA: Diagnosis not present

## 2016-04-12 DIAGNOSIS — G300 Alzheimer's disease with early onset: Secondary | ICD-10-CM | POA: Diagnosis not present

## 2016-04-13 DIAGNOSIS — G300 Alzheimer's disease with early onset: Secondary | ICD-10-CM | POA: Diagnosis not present

## 2016-04-14 DIAGNOSIS — G300 Alzheimer's disease with early onset: Secondary | ICD-10-CM | POA: Diagnosis not present

## 2016-04-15 DIAGNOSIS — G300 Alzheimer's disease with early onset: Secondary | ICD-10-CM | POA: Diagnosis not present

## 2016-04-16 DIAGNOSIS — G300 Alzheimer's disease with early onset: Secondary | ICD-10-CM | POA: Diagnosis not present

## 2016-04-17 DIAGNOSIS — G300 Alzheimer's disease with early onset: Secondary | ICD-10-CM | POA: Diagnosis not present

## 2016-04-18 DIAGNOSIS — G300 Alzheimer's disease with early onset: Secondary | ICD-10-CM | POA: Diagnosis not present

## 2016-04-19 DIAGNOSIS — G300 Alzheimer's disease with early onset: Secondary | ICD-10-CM | POA: Diagnosis not present

## 2016-04-20 DIAGNOSIS — G300 Alzheimer's disease with early onset: Secondary | ICD-10-CM | POA: Diagnosis not present

## 2016-04-21 DIAGNOSIS — G300 Alzheimer's disease with early onset: Secondary | ICD-10-CM | POA: Diagnosis not present

## 2016-04-22 DIAGNOSIS — G300 Alzheimer's disease with early onset: Secondary | ICD-10-CM | POA: Diagnosis not present

## 2016-04-23 DIAGNOSIS — G300 Alzheimer's disease with early onset: Secondary | ICD-10-CM | POA: Diagnosis not present

## 2016-04-24 DIAGNOSIS — G300 Alzheimer's disease with early onset: Secondary | ICD-10-CM | POA: Diagnosis not present

## 2016-04-25 DIAGNOSIS — G300 Alzheimer's disease with early onset: Secondary | ICD-10-CM | POA: Diagnosis not present

## 2016-04-26 DIAGNOSIS — G300 Alzheimer's disease with early onset: Secondary | ICD-10-CM | POA: Diagnosis not present

## 2016-04-27 DIAGNOSIS — G300 Alzheimer's disease with early onset: Secondary | ICD-10-CM | POA: Diagnosis not present

## 2016-04-28 DIAGNOSIS — G300 Alzheimer's disease with early onset: Secondary | ICD-10-CM | POA: Diagnosis not present

## 2016-04-29 DIAGNOSIS — G300 Alzheimer's disease with early onset: Secondary | ICD-10-CM | POA: Diagnosis not present

## 2016-04-30 DIAGNOSIS — G300 Alzheimer's disease with early onset: Secondary | ICD-10-CM | POA: Diagnosis not present

## 2016-05-01 DIAGNOSIS — G300 Alzheimer's disease with early onset: Secondary | ICD-10-CM | POA: Diagnosis not present

## 2016-05-02 DIAGNOSIS — G300 Alzheimer's disease with early onset: Secondary | ICD-10-CM | POA: Diagnosis not present

## 2016-05-03 DIAGNOSIS — G300 Alzheimer's disease with early onset: Secondary | ICD-10-CM | POA: Diagnosis not present

## 2016-05-04 DIAGNOSIS — G300 Alzheimer's disease with early onset: Secondary | ICD-10-CM | POA: Diagnosis not present

## 2016-05-05 DIAGNOSIS — G300 Alzheimer's disease with early onset: Secondary | ICD-10-CM | POA: Diagnosis not present

## 2016-05-06 DIAGNOSIS — G300 Alzheimer's disease with early onset: Secondary | ICD-10-CM | POA: Diagnosis not present

## 2016-05-07 DIAGNOSIS — G300 Alzheimer's disease with early onset: Secondary | ICD-10-CM | POA: Diagnosis not present

## 2016-05-08 DIAGNOSIS — G300 Alzheimer's disease with early onset: Secondary | ICD-10-CM | POA: Diagnosis not present

## 2016-05-09 DIAGNOSIS — G300 Alzheimer's disease with early onset: Secondary | ICD-10-CM | POA: Diagnosis not present

## 2016-05-10 DIAGNOSIS — G300 Alzheimer's disease with early onset: Secondary | ICD-10-CM | POA: Diagnosis not present

## 2016-05-11 DIAGNOSIS — G300 Alzheimer's disease with early onset: Secondary | ICD-10-CM | POA: Diagnosis not present

## 2016-05-12 DIAGNOSIS — G300 Alzheimer's disease with early onset: Secondary | ICD-10-CM | POA: Diagnosis not present

## 2016-05-13 DIAGNOSIS — G300 Alzheimer's disease with early onset: Secondary | ICD-10-CM | POA: Diagnosis not present

## 2016-05-14 DIAGNOSIS — G300 Alzheimer's disease with early onset: Secondary | ICD-10-CM | POA: Diagnosis not present

## 2016-05-15 DIAGNOSIS — G300 Alzheimer's disease with early onset: Secondary | ICD-10-CM | POA: Diagnosis not present

## 2016-05-16 DIAGNOSIS — G300 Alzheimer's disease with early onset: Secondary | ICD-10-CM | POA: Diagnosis not present

## 2016-05-17 DIAGNOSIS — G300 Alzheimer's disease with early onset: Secondary | ICD-10-CM | POA: Diagnosis not present

## 2016-05-18 DIAGNOSIS — G300 Alzheimer's disease with early onset: Secondary | ICD-10-CM | POA: Diagnosis not present

## 2016-05-19 DIAGNOSIS — G300 Alzheimer's disease with early onset: Secondary | ICD-10-CM | POA: Diagnosis not present

## 2016-05-20 DIAGNOSIS — G300 Alzheimer's disease with early onset: Secondary | ICD-10-CM | POA: Diagnosis not present

## 2016-05-21 DIAGNOSIS — G300 Alzheimer's disease with early onset: Secondary | ICD-10-CM | POA: Diagnosis not present

## 2016-05-22 DIAGNOSIS — G300 Alzheimer's disease with early onset: Secondary | ICD-10-CM | POA: Diagnosis not present

## 2016-05-23 DIAGNOSIS — G300 Alzheimer's disease with early onset: Secondary | ICD-10-CM | POA: Diagnosis not present

## 2016-05-24 DIAGNOSIS — G300 Alzheimer's disease with early onset: Secondary | ICD-10-CM | POA: Diagnosis not present

## 2016-05-25 DIAGNOSIS — G300 Alzheimer's disease with early onset: Secondary | ICD-10-CM | POA: Diagnosis not present

## 2016-05-26 DIAGNOSIS — G300 Alzheimer's disease with early onset: Secondary | ICD-10-CM | POA: Diagnosis not present

## 2016-05-27 DIAGNOSIS — G300 Alzheimer's disease with early onset: Secondary | ICD-10-CM | POA: Diagnosis not present

## 2016-05-28 DIAGNOSIS — G300 Alzheimer's disease with early onset: Secondary | ICD-10-CM | POA: Diagnosis not present

## 2016-05-29 DIAGNOSIS — G300 Alzheimer's disease with early onset: Secondary | ICD-10-CM | POA: Diagnosis not present

## 2016-05-30 DIAGNOSIS — G300 Alzheimer's disease with early onset: Secondary | ICD-10-CM | POA: Diagnosis not present

## 2016-05-31 DIAGNOSIS — G300 Alzheimer's disease with early onset: Secondary | ICD-10-CM | POA: Diagnosis not present

## 2016-06-01 DIAGNOSIS — M353 Polymyalgia rheumatica: Secondary | ICD-10-CM | POA: Diagnosis not present

## 2016-06-01 DIAGNOSIS — G300 Alzheimer's disease with early onset: Secondary | ICD-10-CM | POA: Diagnosis not present

## 2016-06-01 DIAGNOSIS — M069 Rheumatoid arthritis, unspecified: Secondary | ICD-10-CM | POA: Diagnosis not present

## 2016-06-01 DIAGNOSIS — M810 Age-related osteoporosis without current pathological fracture: Secondary | ICD-10-CM | POA: Diagnosis not present

## 2016-06-01 DIAGNOSIS — I10 Essential (primary) hypertension: Secondary | ICD-10-CM | POA: Diagnosis not present

## 2016-06-01 DIAGNOSIS — F329 Major depressive disorder, single episode, unspecified: Secondary | ICD-10-CM | POA: Diagnosis not present

## 2016-06-01 DIAGNOSIS — G309 Alzheimer's disease, unspecified: Secondary | ICD-10-CM | POA: Diagnosis not present

## 2016-06-02 DIAGNOSIS — G300 Alzheimer's disease with early onset: Secondary | ICD-10-CM | POA: Diagnosis not present

## 2016-06-03 DIAGNOSIS — G300 Alzheimer's disease with early onset: Secondary | ICD-10-CM | POA: Diagnosis not present

## 2016-06-04 DIAGNOSIS — G300 Alzheimer's disease with early onset: Secondary | ICD-10-CM | POA: Diagnosis not present

## 2016-06-05 DIAGNOSIS — G300 Alzheimer's disease with early onset: Secondary | ICD-10-CM | POA: Diagnosis not present

## 2016-06-06 DIAGNOSIS — G300 Alzheimer's disease with early onset: Secondary | ICD-10-CM | POA: Diagnosis not present

## 2016-06-07 DIAGNOSIS — G300 Alzheimer's disease with early onset: Secondary | ICD-10-CM | POA: Diagnosis not present

## 2016-06-08 DIAGNOSIS — G300 Alzheimer's disease with early onset: Secondary | ICD-10-CM | POA: Diagnosis not present

## 2016-06-09 DIAGNOSIS — G300 Alzheimer's disease with early onset: Secondary | ICD-10-CM | POA: Diagnosis not present

## 2016-06-10 DIAGNOSIS — G300 Alzheimer's disease with early onset: Secondary | ICD-10-CM | POA: Diagnosis not present

## 2016-06-11 DIAGNOSIS — G300 Alzheimer's disease with early onset: Secondary | ICD-10-CM | POA: Diagnosis not present

## 2016-06-12 DIAGNOSIS — G300 Alzheimer's disease with early onset: Secondary | ICD-10-CM | POA: Diagnosis not present

## 2016-06-13 DIAGNOSIS — G300 Alzheimer's disease with early onset: Secondary | ICD-10-CM | POA: Diagnosis not present

## 2016-06-14 DIAGNOSIS — G300 Alzheimer's disease with early onset: Secondary | ICD-10-CM | POA: Diagnosis not present

## 2016-06-15 DIAGNOSIS — G300 Alzheimer's disease with early onset: Secondary | ICD-10-CM | POA: Diagnosis not present

## 2016-06-16 DIAGNOSIS — G300 Alzheimer's disease with early onset: Secondary | ICD-10-CM | POA: Diagnosis not present

## 2016-06-17 DIAGNOSIS — G300 Alzheimer's disease with early onset: Secondary | ICD-10-CM | POA: Diagnosis not present

## 2016-06-18 DIAGNOSIS — S098XXA Other specified injuries of head, initial encounter: Secondary | ICD-10-CM | POA: Diagnosis not present

## 2016-06-18 DIAGNOSIS — F0280 Dementia in other diseases classified elsewhere without behavioral disturbance: Secondary | ICD-10-CM | POA: Diagnosis not present

## 2016-06-18 DIAGNOSIS — S0083XA Contusion of other part of head, initial encounter: Secondary | ICD-10-CM | POA: Diagnosis not present

## 2016-06-18 DIAGNOSIS — W01198A Fall on same level from slipping, tripping and stumbling with subsequent striking against other object, initial encounter: Secondary | ICD-10-CM | POA: Diagnosis not present

## 2016-06-18 DIAGNOSIS — S0091XA Abrasion of unspecified part of head, initial encounter: Secondary | ICD-10-CM | POA: Diagnosis not present

## 2016-06-18 DIAGNOSIS — S0990XA Unspecified injury of head, initial encounter: Secondary | ICD-10-CM | POA: Diagnosis not present

## 2016-06-18 DIAGNOSIS — G309 Alzheimer's disease, unspecified: Secondary | ICD-10-CM | POA: Diagnosis not present

## 2016-06-18 DIAGNOSIS — S6991XA Unspecified injury of right wrist, hand and finger(s), initial encounter: Secondary | ICD-10-CM | POA: Diagnosis not present

## 2016-06-18 DIAGNOSIS — R279 Unspecified lack of coordination: Secondary | ICD-10-CM | POA: Diagnosis not present

## 2016-06-18 DIAGNOSIS — I10 Essential (primary) hypertension: Secondary | ICD-10-CM | POA: Diagnosis not present

## 2016-06-18 DIAGNOSIS — Z7951 Long term (current) use of inhaled steroids: Secondary | ICD-10-CM | POA: Diagnosis not present

## 2016-06-18 DIAGNOSIS — G4489 Other headache syndrome: Secondary | ICD-10-CM | POA: Diagnosis not present

## 2016-06-18 DIAGNOSIS — Z743 Need for continuous supervision: Secondary | ICD-10-CM | POA: Diagnosis not present

## 2016-06-18 DIAGNOSIS — G300 Alzheimer's disease with early onset: Secondary | ICD-10-CM | POA: Diagnosis not present

## 2016-06-18 DIAGNOSIS — R220 Localized swelling, mass and lump, head: Secondary | ICD-10-CM | POA: Diagnosis not present

## 2016-06-18 DIAGNOSIS — R51 Headache: Secondary | ICD-10-CM | POA: Diagnosis not present

## 2016-06-18 DIAGNOSIS — Z79899 Other long term (current) drug therapy: Secondary | ICD-10-CM | POA: Diagnosis not present

## 2016-06-18 DIAGNOSIS — S199XXA Unspecified injury of neck, initial encounter: Secondary | ICD-10-CM | POA: Diagnosis not present

## 2016-06-19 DIAGNOSIS — G300 Alzheimer's disease with early onset: Secondary | ICD-10-CM | POA: Diagnosis not present

## 2016-06-20 DIAGNOSIS — G300 Alzheimer's disease with early onset: Secondary | ICD-10-CM | POA: Diagnosis not present

## 2016-06-21 DIAGNOSIS — G300 Alzheimer's disease with early onset: Secondary | ICD-10-CM | POA: Diagnosis not present

## 2016-06-22 DIAGNOSIS — M810 Age-related osteoporosis without current pathological fracture: Secondary | ICD-10-CM | POA: Diagnosis not present

## 2016-06-22 DIAGNOSIS — M353 Polymyalgia rheumatica: Secondary | ICD-10-CM | POA: Diagnosis not present

## 2016-06-22 DIAGNOSIS — F329 Major depressive disorder, single episode, unspecified: Secondary | ICD-10-CM | POA: Diagnosis not present

## 2016-06-22 DIAGNOSIS — G309 Alzheimer's disease, unspecified: Secondary | ICD-10-CM | POA: Diagnosis not present

## 2016-06-22 DIAGNOSIS — I10 Essential (primary) hypertension: Secondary | ICD-10-CM | POA: Diagnosis not present

## 2016-06-22 DIAGNOSIS — G300 Alzheimer's disease with early onset: Secondary | ICD-10-CM | POA: Diagnosis not present

## 2016-06-23 DIAGNOSIS — G300 Alzheimer's disease with early onset: Secondary | ICD-10-CM | POA: Diagnosis not present

## 2016-06-24 DIAGNOSIS — R079 Chest pain, unspecified: Secondary | ICD-10-CM | POA: Diagnosis not present

## 2016-06-24 DIAGNOSIS — R262 Difficulty in walking, not elsewhere classified: Secondary | ICD-10-CM | POA: Diagnosis not present

## 2016-06-24 DIAGNOSIS — G309 Alzheimer's disease, unspecified: Secondary | ICD-10-CM | POA: Diagnosis not present

## 2016-06-24 DIAGNOSIS — N390 Urinary tract infection, site not specified: Secondary | ICD-10-CM | POA: Diagnosis not present

## 2016-06-24 DIAGNOSIS — I10 Essential (primary) hypertension: Secondary | ICD-10-CM | POA: Diagnosis not present

## 2016-06-24 DIAGNOSIS — J309 Allergic rhinitis, unspecified: Secondary | ICD-10-CM | POA: Diagnosis not present

## 2016-06-24 DIAGNOSIS — F0280 Dementia in other diseases classified elsewhere without behavioral disturbance: Secondary | ICD-10-CM | POA: Diagnosis not present

## 2016-06-24 DIAGNOSIS — G300 Alzheimer's disease with early onset: Secondary | ICD-10-CM | POA: Diagnosis not present

## 2016-06-24 DIAGNOSIS — K219 Gastro-esophageal reflux disease without esophagitis: Secondary | ICD-10-CM | POA: Diagnosis not present

## 2016-06-24 DIAGNOSIS — R1013 Epigastric pain: Secondary | ICD-10-CM | POA: Diagnosis not present

## 2016-06-24 DIAGNOSIS — F329 Major depressive disorder, single episode, unspecified: Secondary | ICD-10-CM | POA: Diagnosis not present

## 2016-06-24 DIAGNOSIS — R109 Unspecified abdominal pain: Secondary | ICD-10-CM | POA: Diagnosis not present

## 2016-06-24 DIAGNOSIS — K589 Irritable bowel syndrome without diarrhea: Secondary | ICD-10-CM | POA: Diagnosis not present

## 2016-06-24 DIAGNOSIS — R402411 Glasgow coma scale score 13-15, in the field [EMT or ambulance]: Secondary | ICD-10-CM | POA: Diagnosis not present

## 2016-06-24 DIAGNOSIS — R443 Hallucinations, unspecified: Secondary | ICD-10-CM | POA: Diagnosis not present

## 2016-06-24 DIAGNOSIS — R404 Transient alteration of awareness: Secondary | ICD-10-CM | POA: Diagnosis not present

## 2016-06-24 DIAGNOSIS — F4489 Other dissociative and conversion disorders: Secondary | ICD-10-CM | POA: Diagnosis not present

## 2016-06-25 DIAGNOSIS — R4182 Altered mental status, unspecified: Secondary | ICD-10-CM | POA: Diagnosis not present

## 2016-06-25 DIAGNOSIS — R109 Unspecified abdominal pain: Secondary | ICD-10-CM | POA: Diagnosis not present

## 2016-06-25 DIAGNOSIS — R079 Chest pain, unspecified: Secondary | ICD-10-CM | POA: Diagnosis not present

## 2016-06-25 DIAGNOSIS — F0390 Unspecified dementia without behavioral disturbance: Secondary | ICD-10-CM | POA: Diagnosis not present

## 2016-06-26 DIAGNOSIS — R4182 Altered mental status, unspecified: Secondary | ICD-10-CM | POA: Diagnosis not present

## 2016-06-26 DIAGNOSIS — R109 Unspecified abdominal pain: Secondary | ICD-10-CM | POA: Diagnosis not present

## 2016-06-26 DIAGNOSIS — F0390 Unspecified dementia without behavioral disturbance: Secondary | ICD-10-CM | POA: Diagnosis not present

## 2016-06-26 DIAGNOSIS — N390 Urinary tract infection, site not specified: Secondary | ICD-10-CM | POA: Diagnosis not present

## 2016-06-27 DIAGNOSIS — M4716 Other spondylosis with myelopathy, lumbar region: Secondary | ICD-10-CM | POA: Diagnosis not present

## 2016-06-27 DIAGNOSIS — R0902 Hypoxemia: Secondary | ICD-10-CM | POA: Diagnosis not present

## 2016-06-27 DIAGNOSIS — I2699 Other pulmonary embolism without acute cor pulmonale: Secondary | ICD-10-CM | POA: Diagnosis not present

## 2016-06-27 DIAGNOSIS — I10 Essential (primary) hypertension: Secondary | ICD-10-CM | POA: Diagnosis not present

## 2016-06-28 DIAGNOSIS — R296 Repeated falls: Secondary | ICD-10-CM | POA: Diagnosis not present

## 2016-06-28 DIAGNOSIS — R4182 Altered mental status, unspecified: Secondary | ICD-10-CM | POA: Diagnosis not present

## 2016-06-29 DIAGNOSIS — R4182 Altered mental status, unspecified: Secondary | ICD-10-CM | POA: Diagnosis not present

## 2016-06-29 DIAGNOSIS — R296 Repeated falls: Secondary | ICD-10-CM | POA: Diagnosis not present

## 2016-06-30 DIAGNOSIS — R4182 Altered mental status, unspecified: Secondary | ICD-10-CM | POA: Diagnosis not present

## 2016-06-30 DIAGNOSIS — R296 Repeated falls: Secondary | ICD-10-CM | POA: Diagnosis not present

## 2016-06-30 DIAGNOSIS — G300 Alzheimer's disease with early onset: Secondary | ICD-10-CM | POA: Diagnosis not present

## 2016-07-01 DIAGNOSIS — I10 Essential (primary) hypertension: Secondary | ICD-10-CM | POA: Diagnosis not present

## 2016-07-01 DIAGNOSIS — R1314 Dysphagia, pharyngoesophageal phase: Secondary | ICD-10-CM | POA: Diagnosis not present

## 2016-07-01 DIAGNOSIS — K219 Gastro-esophageal reflux disease without esophagitis: Secondary | ICD-10-CM | POA: Diagnosis not present

## 2016-07-01 DIAGNOSIS — K589 Irritable bowel syndrome without diarrhea: Secondary | ICD-10-CM | POA: Diagnosis not present

## 2016-07-01 DIAGNOSIS — R262 Difficulty in walking, not elsewhere classified: Secondary | ICD-10-CM | POA: Diagnosis not present

## 2016-07-01 DIAGNOSIS — N390 Urinary tract infection, site not specified: Secondary | ICD-10-CM | POA: Diagnosis not present

## 2016-07-01 DIAGNOSIS — G301 Alzheimer's disease with late onset: Secondary | ICD-10-CM | POA: Diagnosis not present

## 2016-07-01 DIAGNOSIS — M6281 Muscle weakness (generalized): Secondary | ICD-10-CM | POA: Diagnosis not present

## 2016-07-01 DIAGNOSIS — R1909 Other intra-abdominal and pelvic swelling, mass and lump: Secondary | ICD-10-CM | POA: Diagnosis not present

## 2016-07-01 DIAGNOSIS — F329 Major depressive disorder, single episode, unspecified: Secondary | ICD-10-CM | POA: Diagnosis not present

## 2016-07-01 DIAGNOSIS — R2681 Unsteadiness on feet: Secondary | ICD-10-CM | POA: Diagnosis not present

## 2016-07-01 DIAGNOSIS — G300 Alzheimer's disease with early onset: Secondary | ICD-10-CM | POA: Diagnosis not present

## 2016-07-01 DIAGNOSIS — G309 Alzheimer's disease, unspecified: Secondary | ICD-10-CM | POA: Diagnosis not present

## 2016-07-01 DIAGNOSIS — R2689 Other abnormalities of gait and mobility: Secondary | ICD-10-CM | POA: Diagnosis not present

## 2016-07-01 DIAGNOSIS — R4182 Altered mental status, unspecified: Secondary | ICD-10-CM | POA: Diagnosis not present

## 2016-07-01 DIAGNOSIS — F0280 Dementia in other diseases classified elsewhere without behavioral disturbance: Secondary | ICD-10-CM | POA: Diagnosis not present

## 2016-07-01 DIAGNOSIS — J309 Allergic rhinitis, unspecified: Secondary | ICD-10-CM | POA: Diagnosis not present

## 2016-07-01 DIAGNOSIS — R296 Repeated falls: Secondary | ICD-10-CM | POA: Diagnosis not present

## 2016-07-07 DIAGNOSIS — R296 Repeated falls: Secondary | ICD-10-CM | POA: Diagnosis not present

## 2016-07-07 DIAGNOSIS — G301 Alzheimer's disease with late onset: Secondary | ICD-10-CM | POA: Diagnosis not present

## 2016-08-14 DIAGNOSIS — I10 Essential (primary) hypertension: Secondary | ICD-10-CM | POA: Diagnosis not present

## 2016-08-17 DIAGNOSIS — H353131 Nonexudative age-related macular degeneration, bilateral, early dry stage: Secondary | ICD-10-CM | POA: Diagnosis not present

## 2016-08-17 DIAGNOSIS — H04123 Dry eye syndrome of bilateral lacrimal glands: Secondary | ICD-10-CM | POA: Diagnosis not present

## 2016-08-17 DIAGNOSIS — Z961 Presence of intraocular lens: Secondary | ICD-10-CM | POA: Diagnosis not present

## 2016-09-01 DIAGNOSIS — R296 Repeated falls: Secondary | ICD-10-CM | POA: Diagnosis not present

## 2016-09-01 DIAGNOSIS — G301 Alzheimer's disease with late onset: Secondary | ICD-10-CM | POA: Diagnosis not present

## 2016-09-16 IMAGING — DX DG LUMBAR SPINE COMPLETE 4+V
5 series · 5 of 5 positions shown · non-contrast
Comparison: Report from 08/29/2011 lumbar MRI, which showed an L5
compression fracture. 04/18/2014 and report from CT abdomen pelvis
from [facility].

CLINICAL DATA: Fall.  Low back pain.

EXAM:
LUMBAR SPINE - COMPLETE 4+ VIEW

[l-spine ap]
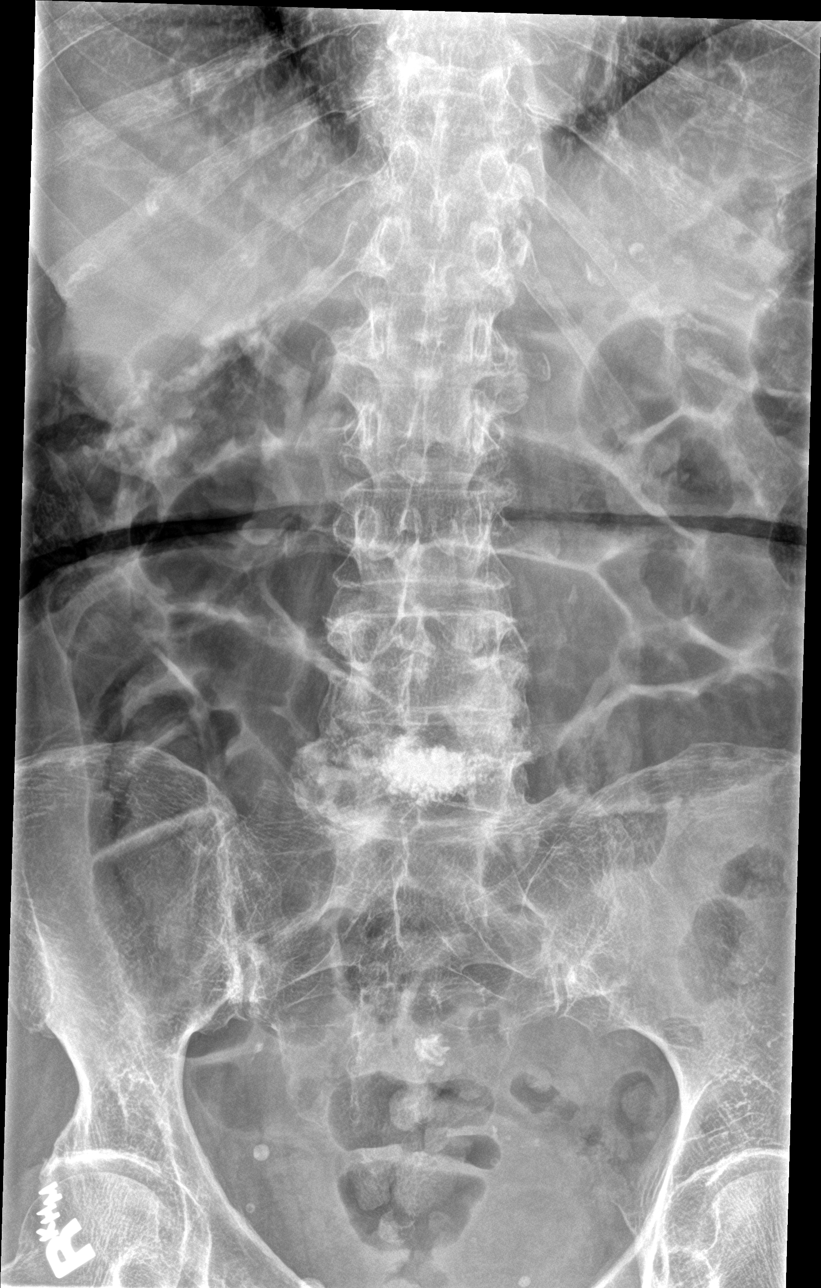

[l-spine obl (1 of 2)]
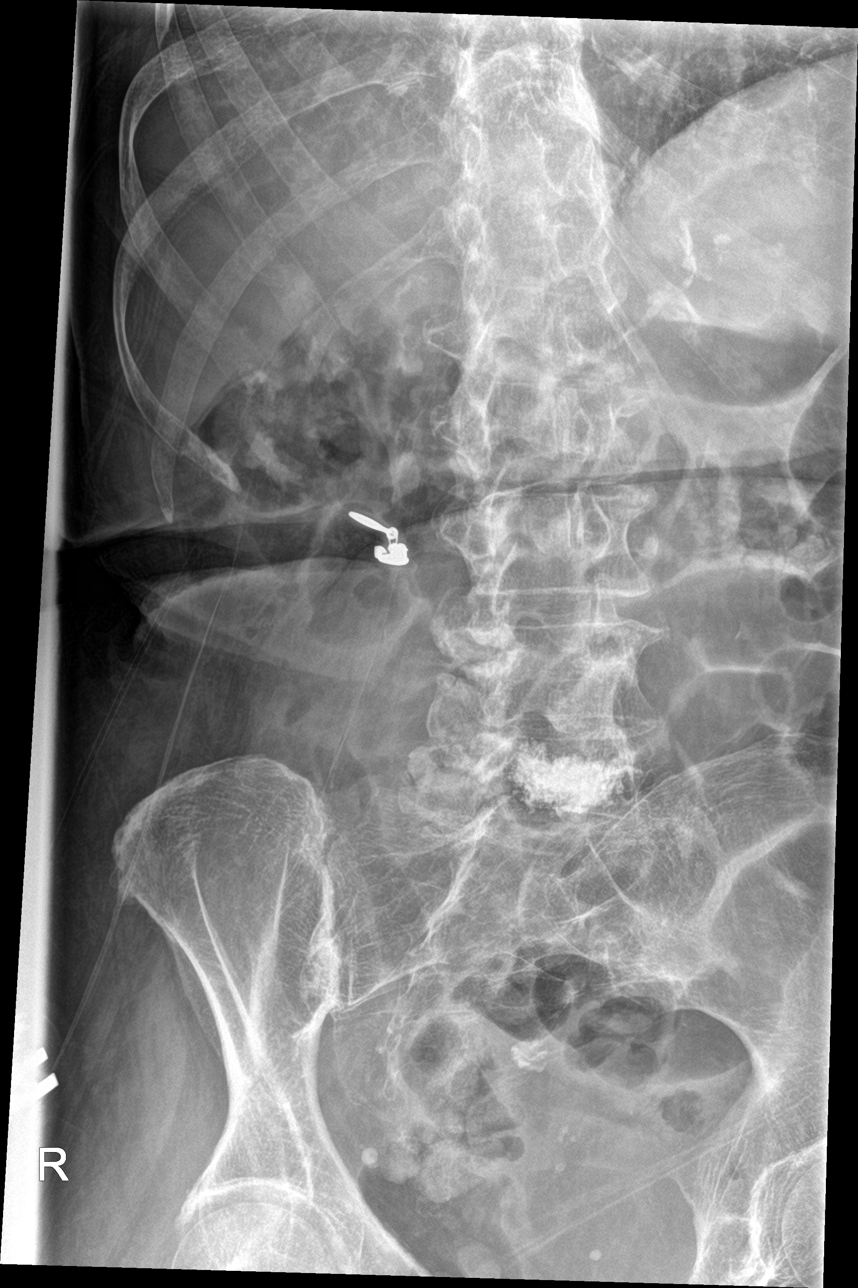

[l-spine obl (2 of 2)]
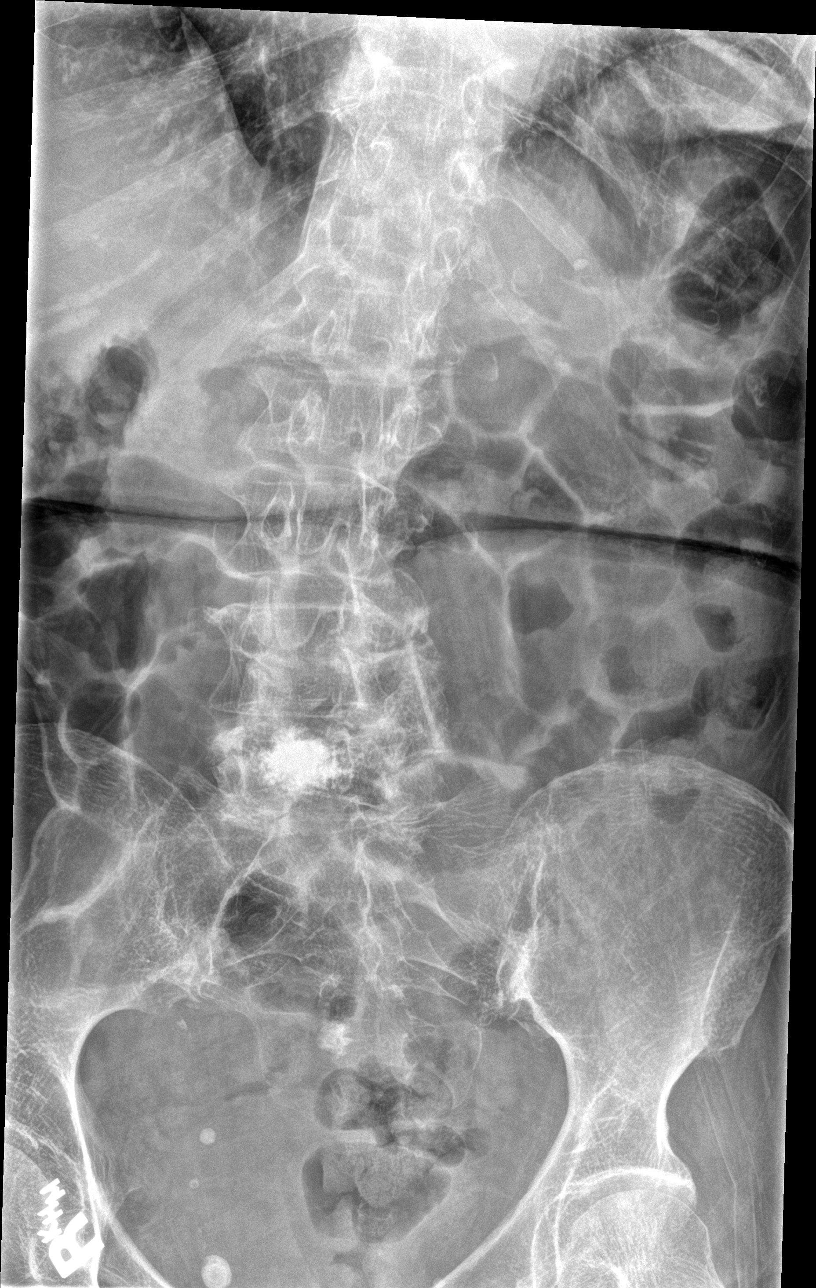

[l-spine lat]
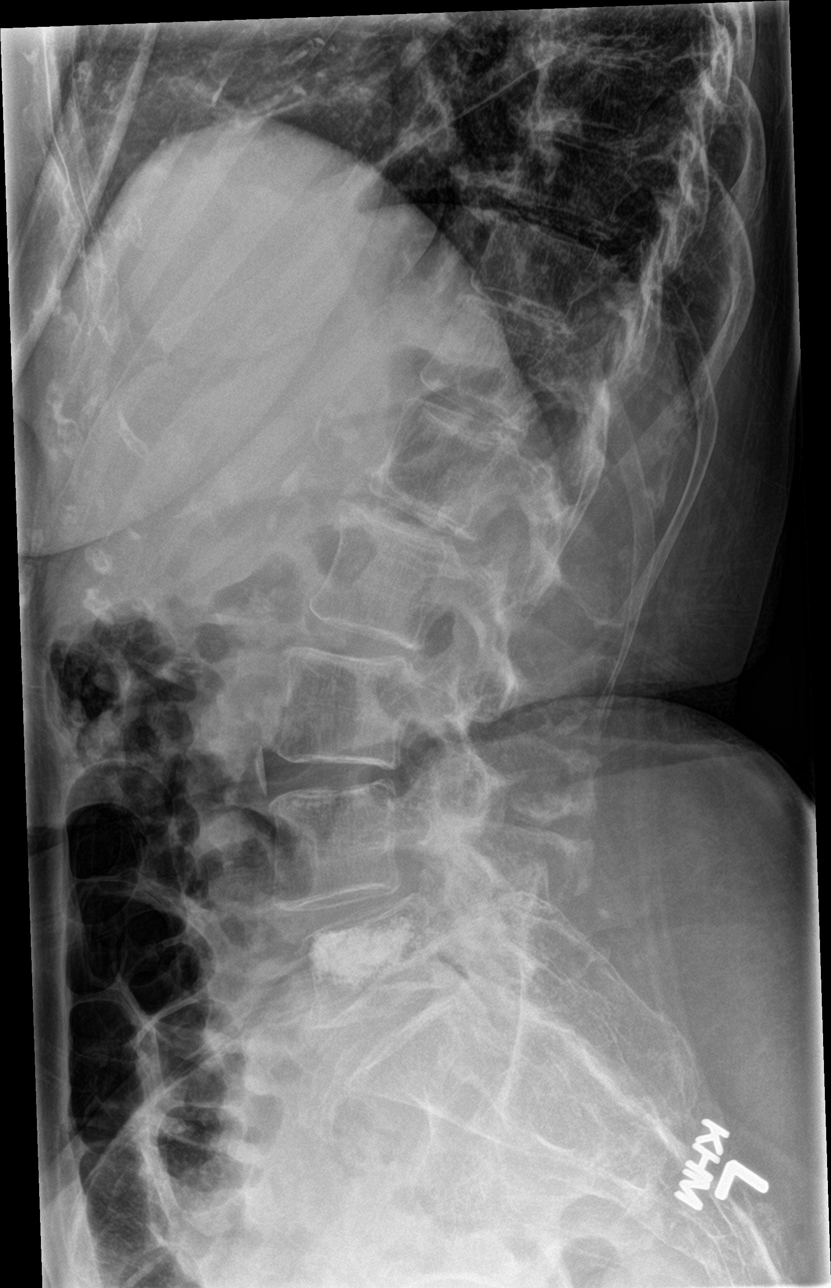

[l-spine spot]
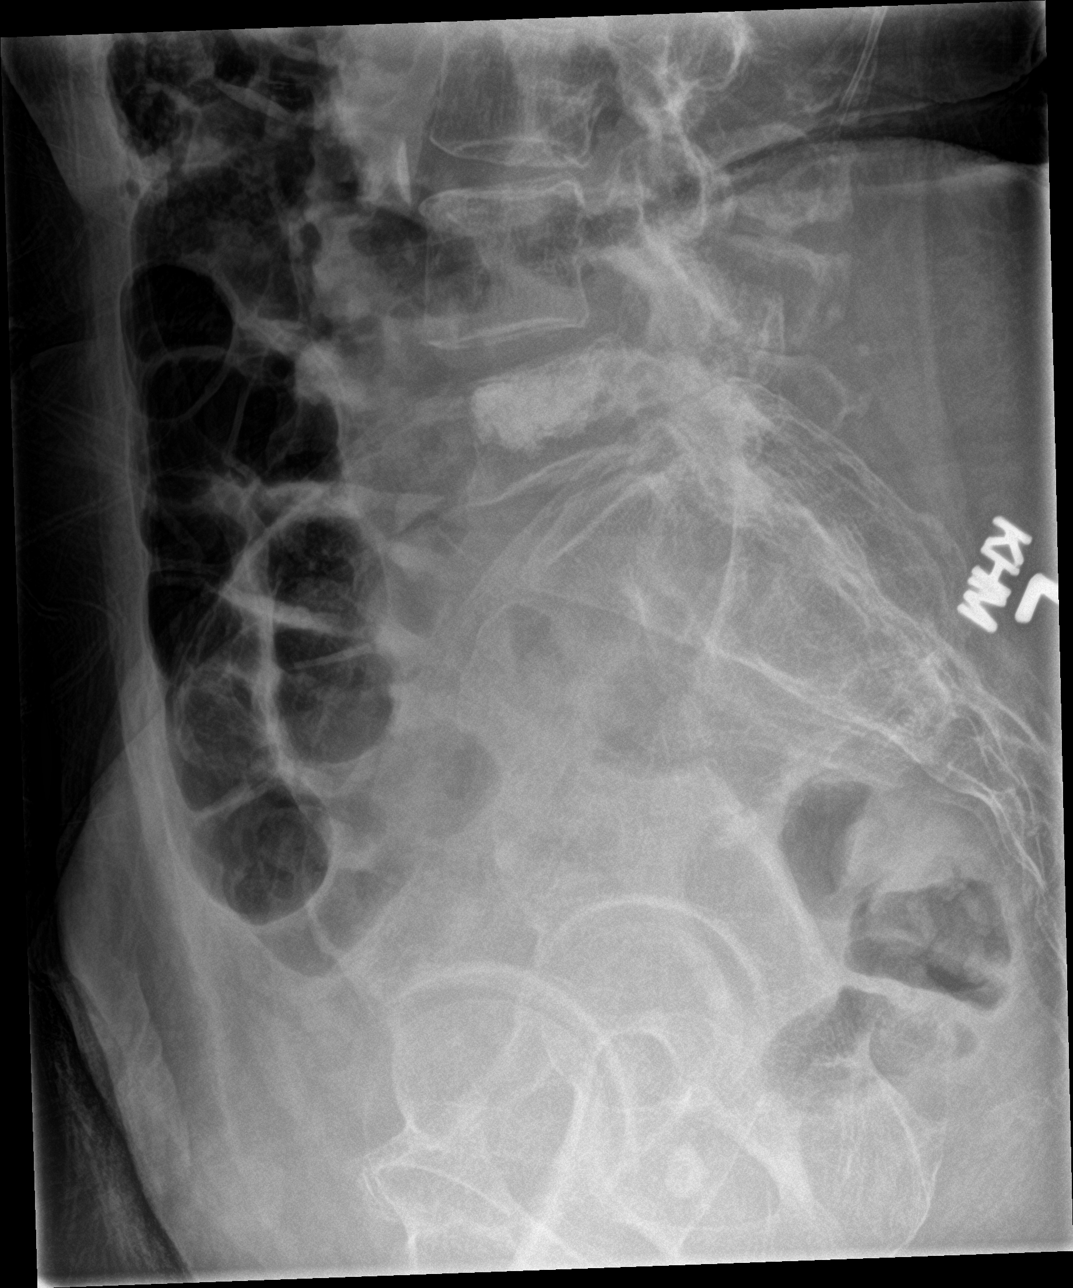

[5 of 5 positions shown; findings below may reference images not displayed]

FINDINGS: Bony demineralization. Vertebral augmentation at L5. There is 5 mm
anterolisthesis at L4-5, probably from the degenerative facet
arthropathy

Spurring and loss of intervertebral disc height at the L1-2 level.

Lower lumbar degenerative facet arthropathy.
IMPRESSION: 1. Vertebral augmentation at L5.
2. 4 mm grade 1 anterolisthesis at L4-5, probably from the
degenerative facet arthropathy, and documented on the prior lumbar
spine CT dated 04/18/2014.

## 2016-09-16 IMAGING — DX DG HIP (WITH OR WITHOUT PELVIS) 2V BILAT
6 series · 6 of 6 positions shown · non-contrast
Comparison: None.

CLINICAL DATA: Fall with bilateral hip pain

EXAM:
BILATERAL HIP (WITH PELVIS) 2 VIEWS

[pelvis ap]
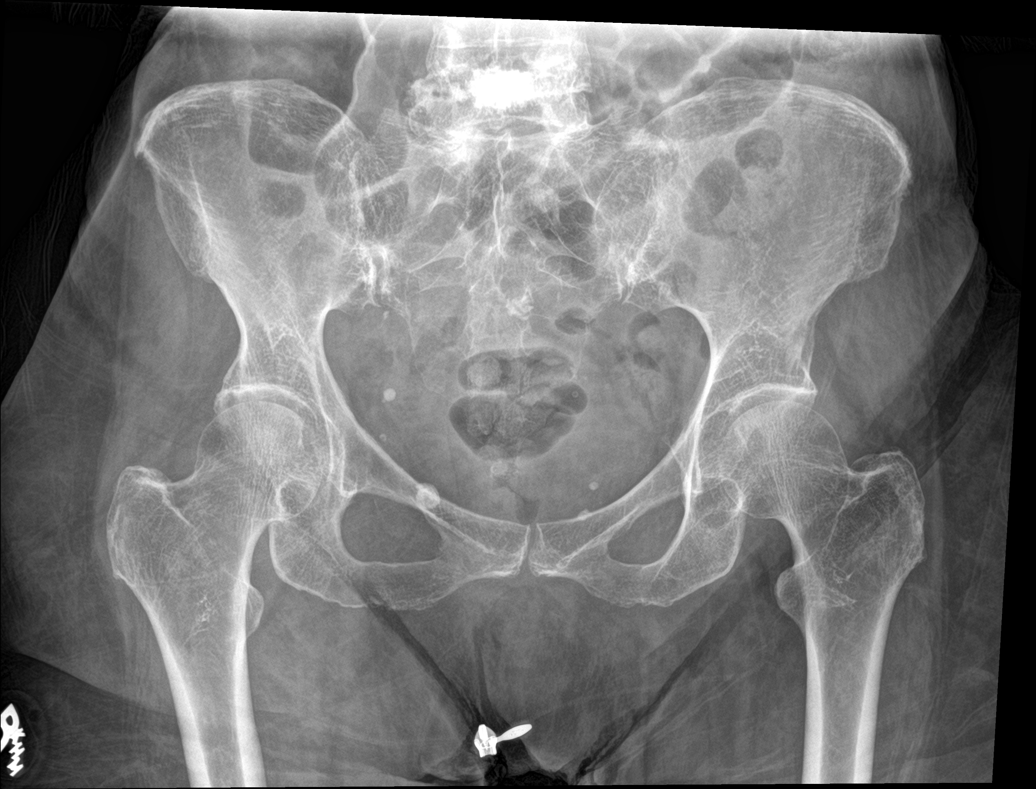

[hip ap (1 of 2)]
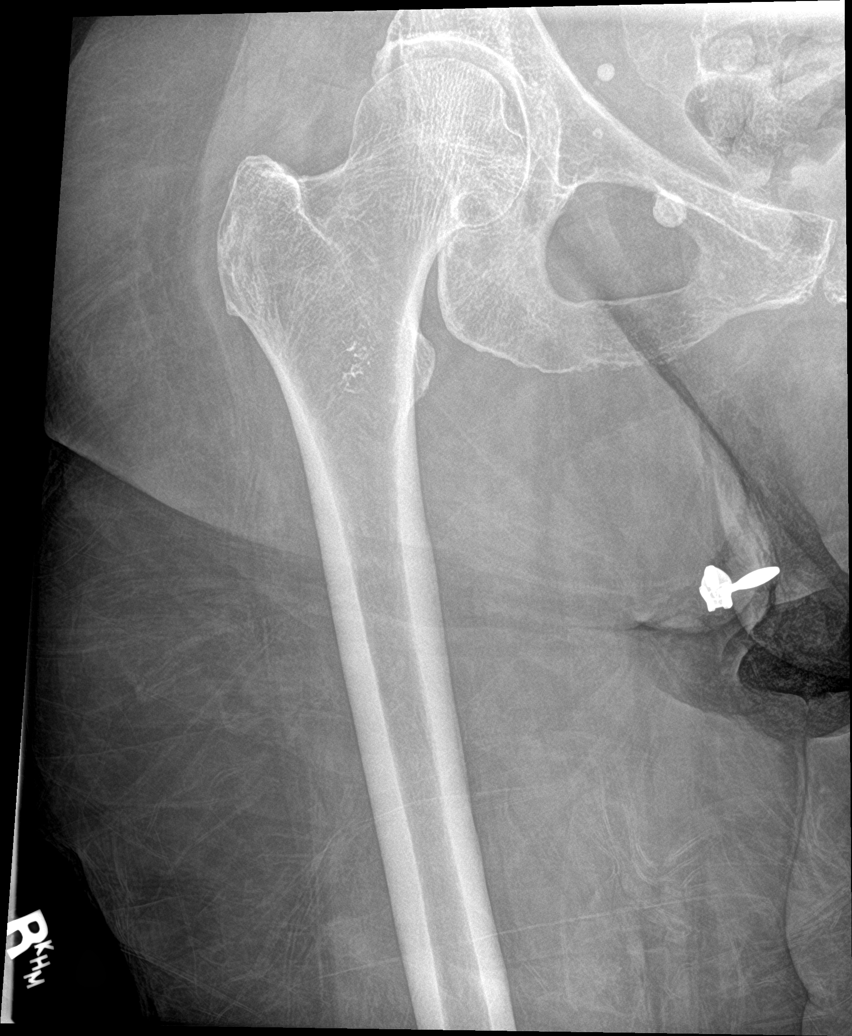

[hip lat (1 of 3)]
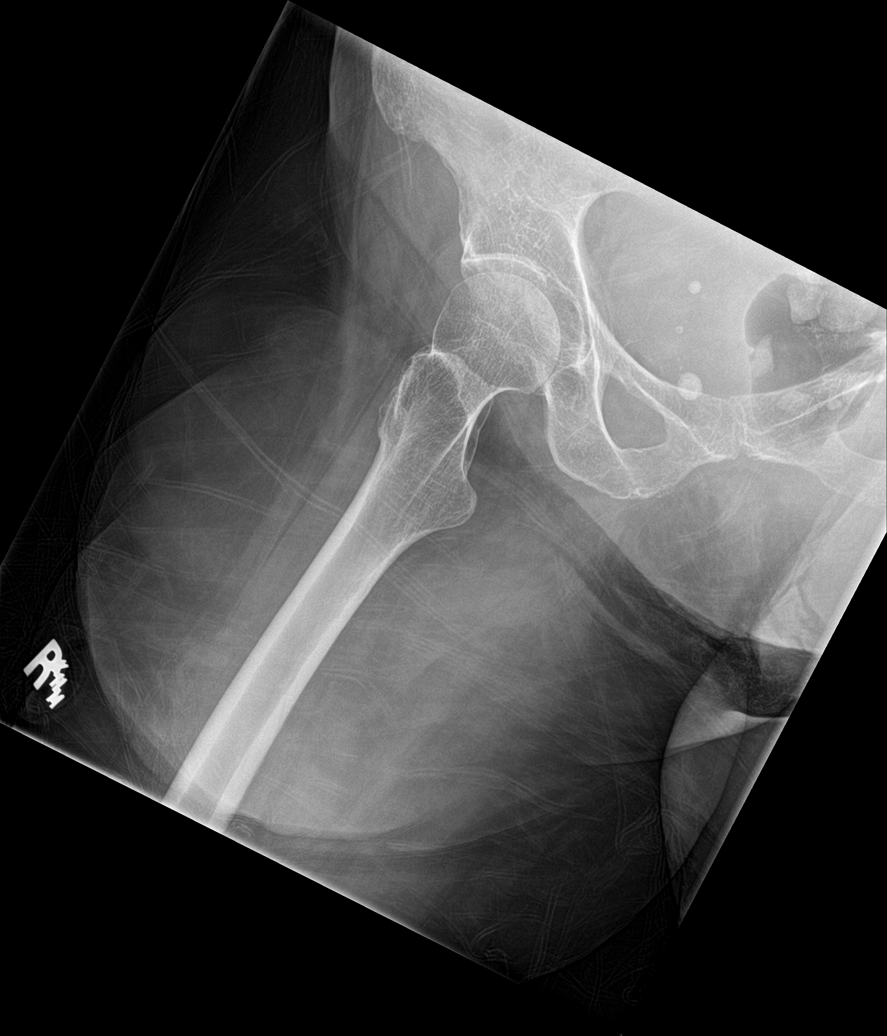

[hip ap (2 of 2)]
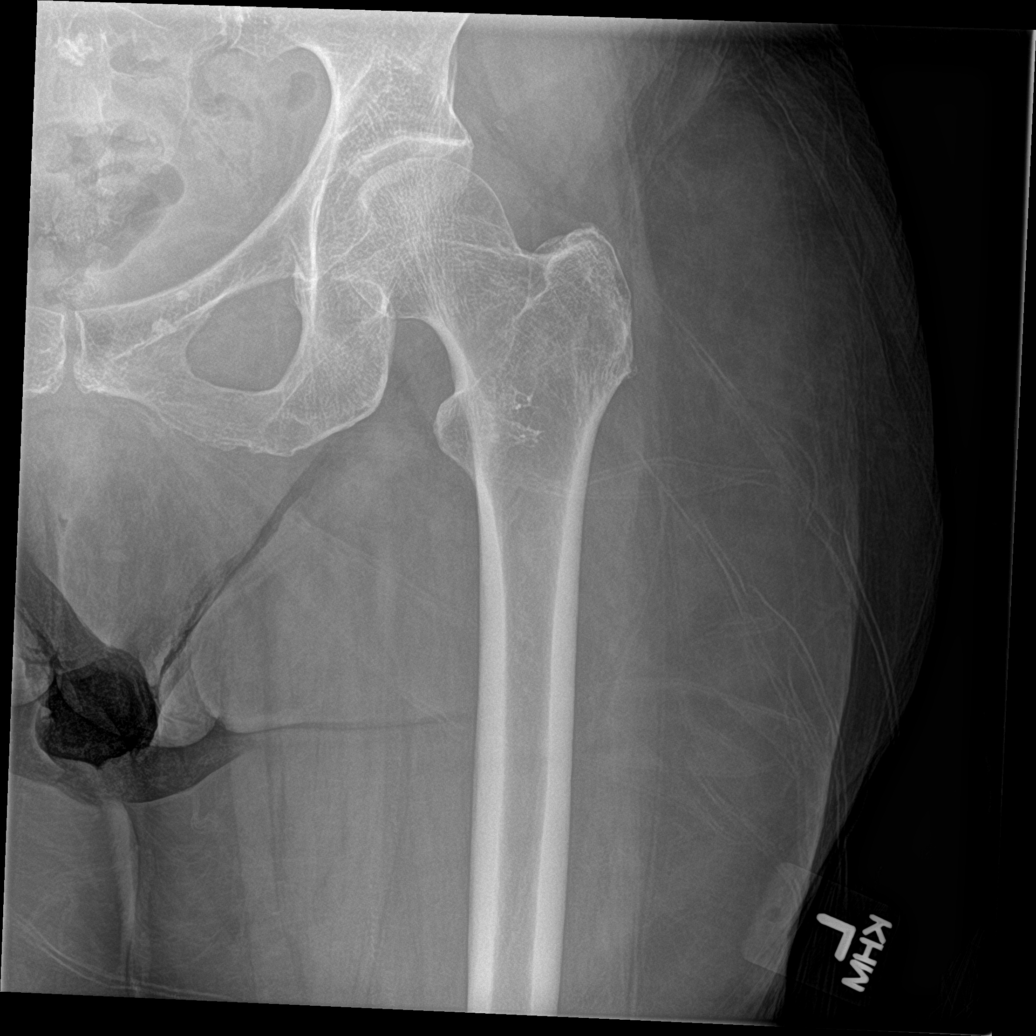

[hip lat (2 of 3)]
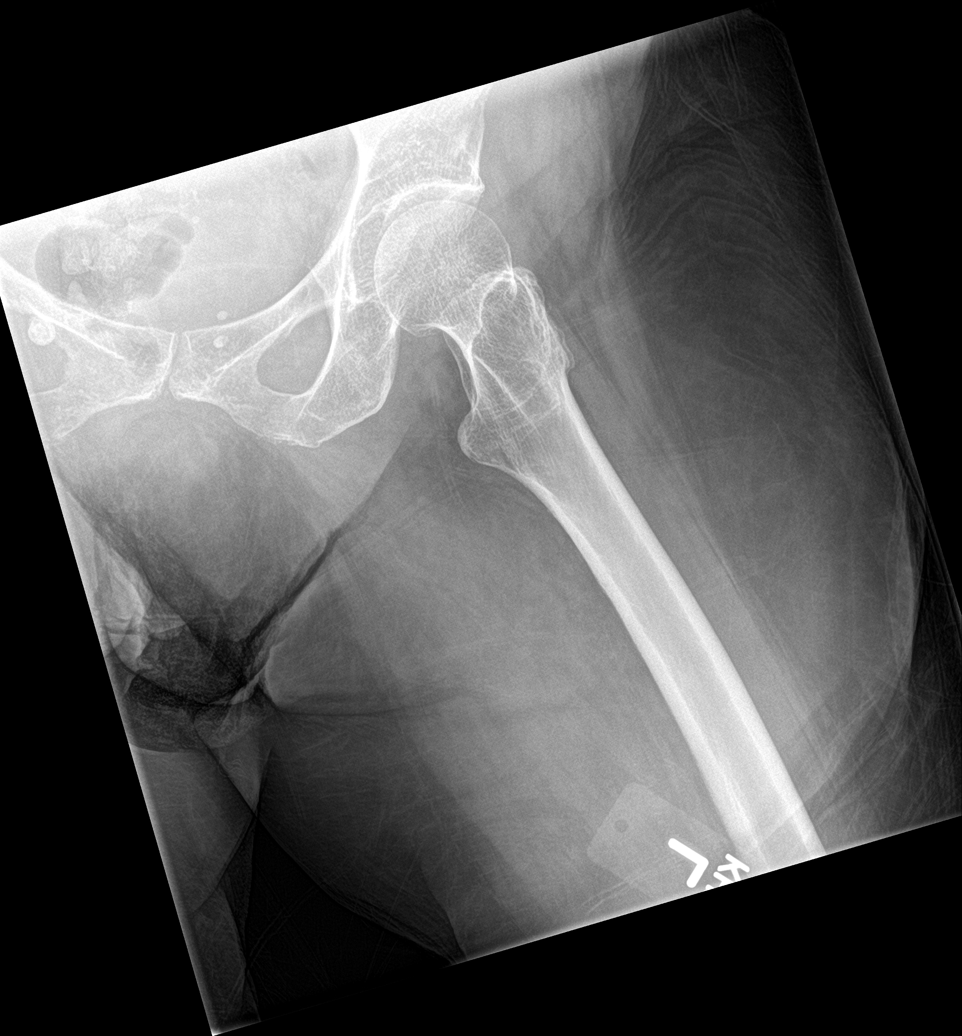

[hip lat (3 of 3)]
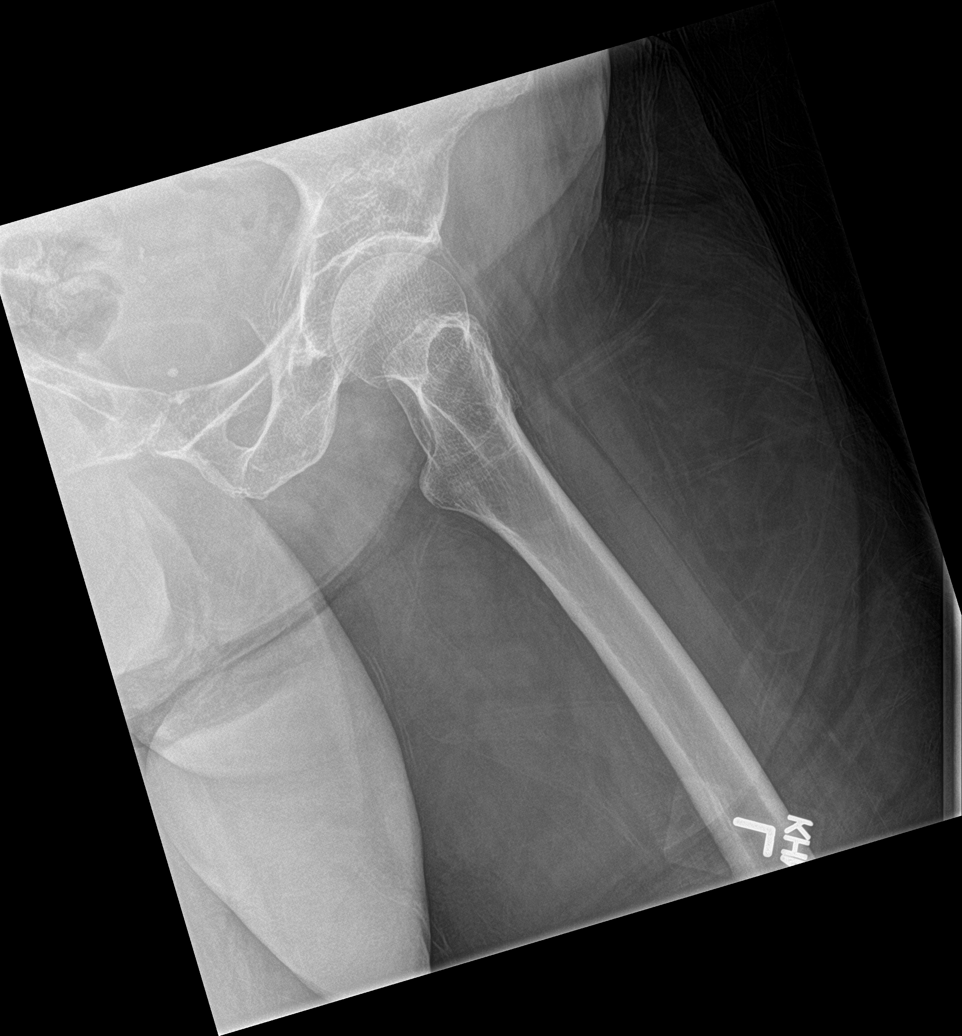

[6 of 6 positions shown; findings below may reference images not displayed]

FINDINGS: Pelvic bones are intact. No evidence of femur fracture or
dislocation on either side.
IMPRESSION: No acute finding

## 2016-09-18 DIAGNOSIS — E1151 Type 2 diabetes mellitus with diabetic peripheral angiopathy without gangrene: Secondary | ICD-10-CM | POA: Diagnosis not present

## 2016-10-27 DIAGNOSIS — G301 Alzheimer's disease with late onset: Secondary | ICD-10-CM | POA: Diagnosis not present

## 2016-10-27 DIAGNOSIS — R296 Repeated falls: Secondary | ICD-10-CM | POA: Diagnosis not present

## 2016-11-09 DIAGNOSIS — R2689 Other abnormalities of gait and mobility: Secondary | ICD-10-CM | POA: Diagnosis not present

## 2016-11-09 DIAGNOSIS — R4189 Other symptoms and signs involving cognitive functions and awareness: Secondary | ICD-10-CM | POA: Diagnosis not present

## 2016-11-09 DIAGNOSIS — R1314 Dysphagia, pharyngoesophageal phase: Secondary | ICD-10-CM | POA: Diagnosis not present

## 2016-11-09 DIAGNOSIS — R2681 Unsteadiness on feet: Secondary | ICD-10-CM | POA: Diagnosis not present

## 2016-11-09 DIAGNOSIS — G309 Alzheimer's disease, unspecified: Secondary | ICD-10-CM | POA: Diagnosis not present

## 2016-11-09 DIAGNOSIS — M6281 Muscle weakness (generalized): Secondary | ICD-10-CM | POA: Diagnosis not present

## 2016-11-10 DIAGNOSIS — G309 Alzheimer's disease, unspecified: Secondary | ICD-10-CM | POA: Diagnosis not present

## 2016-11-10 DIAGNOSIS — R2681 Unsteadiness on feet: Secondary | ICD-10-CM | POA: Diagnosis not present

## 2016-11-10 DIAGNOSIS — R4189 Other symptoms and signs involving cognitive functions and awareness: Secondary | ICD-10-CM | POA: Diagnosis not present

## 2016-11-10 DIAGNOSIS — R2689 Other abnormalities of gait and mobility: Secondary | ICD-10-CM | POA: Diagnosis not present

## 2016-11-10 DIAGNOSIS — M6281 Muscle weakness (generalized): Secondary | ICD-10-CM | POA: Diagnosis not present

## 2016-11-10 DIAGNOSIS — R1314 Dysphagia, pharyngoesophageal phase: Secondary | ICD-10-CM | POA: Diagnosis not present

## 2016-11-11 DIAGNOSIS — R1314 Dysphagia, pharyngoesophageal phase: Secondary | ICD-10-CM | POA: Diagnosis not present

## 2016-11-11 DIAGNOSIS — R4189 Other symptoms and signs involving cognitive functions and awareness: Secondary | ICD-10-CM | POA: Diagnosis not present

## 2016-11-11 DIAGNOSIS — R2689 Other abnormalities of gait and mobility: Secondary | ICD-10-CM | POA: Diagnosis not present

## 2016-11-11 DIAGNOSIS — M6281 Muscle weakness (generalized): Secondary | ICD-10-CM | POA: Diagnosis not present

## 2016-11-11 DIAGNOSIS — G309 Alzheimer's disease, unspecified: Secondary | ICD-10-CM | POA: Diagnosis not present

## 2016-11-11 DIAGNOSIS — R2681 Unsteadiness on feet: Secondary | ICD-10-CM | POA: Diagnosis not present

## 2016-11-16 DIAGNOSIS — G309 Alzheimer's disease, unspecified: Secondary | ICD-10-CM | POA: Diagnosis not present

## 2016-11-16 DIAGNOSIS — M6281 Muscle weakness (generalized): Secondary | ICD-10-CM | POA: Diagnosis not present

## 2016-11-16 DIAGNOSIS — R4189 Other symptoms and signs involving cognitive functions and awareness: Secondary | ICD-10-CM | POA: Diagnosis not present

## 2016-11-16 DIAGNOSIS — R2681 Unsteadiness on feet: Secondary | ICD-10-CM | POA: Diagnosis not present

## 2016-11-16 DIAGNOSIS — R1314 Dysphagia, pharyngoesophageal phase: Secondary | ICD-10-CM | POA: Diagnosis not present

## 2016-11-16 DIAGNOSIS — R2689 Other abnormalities of gait and mobility: Secondary | ICD-10-CM | POA: Diagnosis not present

## 2016-11-17 DIAGNOSIS — R2681 Unsteadiness on feet: Secondary | ICD-10-CM | POA: Diagnosis not present

## 2016-11-17 DIAGNOSIS — R2689 Other abnormalities of gait and mobility: Secondary | ICD-10-CM | POA: Diagnosis not present

## 2016-11-17 DIAGNOSIS — R1314 Dysphagia, pharyngoesophageal phase: Secondary | ICD-10-CM | POA: Diagnosis not present

## 2016-11-17 DIAGNOSIS — R4189 Other symptoms and signs involving cognitive functions and awareness: Secondary | ICD-10-CM | POA: Diagnosis not present

## 2016-11-17 DIAGNOSIS — G309 Alzheimer's disease, unspecified: Secondary | ICD-10-CM | POA: Diagnosis not present

## 2016-11-17 DIAGNOSIS — M6281 Muscle weakness (generalized): Secondary | ICD-10-CM | POA: Diagnosis not present

## 2016-11-18 DIAGNOSIS — R4189 Other symptoms and signs involving cognitive functions and awareness: Secondary | ICD-10-CM | POA: Diagnosis not present

## 2016-11-18 DIAGNOSIS — R2689 Other abnormalities of gait and mobility: Secondary | ICD-10-CM | POA: Diagnosis not present

## 2016-11-18 DIAGNOSIS — G309 Alzheimer's disease, unspecified: Secondary | ICD-10-CM | POA: Diagnosis not present

## 2016-11-18 DIAGNOSIS — M6281 Muscle weakness (generalized): Secondary | ICD-10-CM | POA: Diagnosis not present

## 2016-11-18 DIAGNOSIS — R2681 Unsteadiness on feet: Secondary | ICD-10-CM | POA: Diagnosis not present

## 2016-11-18 DIAGNOSIS — R1314 Dysphagia, pharyngoesophageal phase: Secondary | ICD-10-CM | POA: Diagnosis not present

## 2016-11-19 DIAGNOSIS — R1314 Dysphagia, pharyngoesophageal phase: Secondary | ICD-10-CM | POA: Diagnosis not present

## 2016-11-19 DIAGNOSIS — R4189 Other symptoms and signs involving cognitive functions and awareness: Secondary | ICD-10-CM | POA: Diagnosis not present

## 2016-11-19 DIAGNOSIS — M6281 Muscle weakness (generalized): Secondary | ICD-10-CM | POA: Diagnosis not present

## 2016-11-19 DIAGNOSIS — G309 Alzheimer's disease, unspecified: Secondary | ICD-10-CM | POA: Diagnosis not present

## 2016-11-19 DIAGNOSIS — R2681 Unsteadiness on feet: Secondary | ICD-10-CM | POA: Diagnosis not present

## 2016-11-19 DIAGNOSIS — R2689 Other abnormalities of gait and mobility: Secondary | ICD-10-CM | POA: Diagnosis not present

## 2016-11-20 DIAGNOSIS — G309 Alzheimer's disease, unspecified: Secondary | ICD-10-CM | POA: Diagnosis not present

## 2016-11-20 DIAGNOSIS — R2681 Unsteadiness on feet: Secondary | ICD-10-CM | POA: Diagnosis not present

## 2016-11-20 DIAGNOSIS — R2689 Other abnormalities of gait and mobility: Secondary | ICD-10-CM | POA: Diagnosis not present

## 2016-11-20 DIAGNOSIS — M6281 Muscle weakness (generalized): Secondary | ICD-10-CM | POA: Diagnosis not present

## 2016-11-20 DIAGNOSIS — R1314 Dysphagia, pharyngoesophageal phase: Secondary | ICD-10-CM | POA: Diagnosis not present

## 2016-11-20 DIAGNOSIS — R4189 Other symptoms and signs involving cognitive functions and awareness: Secondary | ICD-10-CM | POA: Diagnosis not present

## 2016-11-25 DIAGNOSIS — R1314 Dysphagia, pharyngoesophageal phase: Secondary | ICD-10-CM | POA: Diagnosis not present

## 2016-11-25 DIAGNOSIS — M6281 Muscle weakness (generalized): Secondary | ICD-10-CM | POA: Diagnosis not present

## 2016-11-25 DIAGNOSIS — R4189 Other symptoms and signs involving cognitive functions and awareness: Secondary | ICD-10-CM | POA: Diagnosis not present

## 2016-11-25 DIAGNOSIS — R2689 Other abnormalities of gait and mobility: Secondary | ICD-10-CM | POA: Diagnosis not present

## 2016-11-25 DIAGNOSIS — G309 Alzheimer's disease, unspecified: Secondary | ICD-10-CM | POA: Diagnosis not present

## 2016-11-25 DIAGNOSIS — R2681 Unsteadiness on feet: Secondary | ICD-10-CM | POA: Diagnosis not present

## 2016-11-26 DIAGNOSIS — R4189 Other symptoms and signs involving cognitive functions and awareness: Secondary | ICD-10-CM | POA: Diagnosis not present

## 2016-11-26 DIAGNOSIS — R1314 Dysphagia, pharyngoesophageal phase: Secondary | ICD-10-CM | POA: Diagnosis not present

## 2016-11-26 DIAGNOSIS — G309 Alzheimer's disease, unspecified: Secondary | ICD-10-CM | POA: Diagnosis not present

## 2016-11-26 DIAGNOSIS — R2689 Other abnormalities of gait and mobility: Secondary | ICD-10-CM | POA: Diagnosis not present

## 2016-11-26 DIAGNOSIS — R2681 Unsteadiness on feet: Secondary | ICD-10-CM | POA: Diagnosis not present

## 2016-11-26 DIAGNOSIS — M6281 Muscle weakness (generalized): Secondary | ICD-10-CM | POA: Diagnosis not present

## 2016-11-27 DIAGNOSIS — R1314 Dysphagia, pharyngoesophageal phase: Secondary | ICD-10-CM | POA: Diagnosis not present

## 2016-11-27 DIAGNOSIS — R4189 Other symptoms and signs involving cognitive functions and awareness: Secondary | ICD-10-CM | POA: Diagnosis not present

## 2016-11-27 DIAGNOSIS — R2689 Other abnormalities of gait and mobility: Secondary | ICD-10-CM | POA: Diagnosis not present

## 2016-11-27 DIAGNOSIS — R2681 Unsteadiness on feet: Secondary | ICD-10-CM | POA: Diagnosis not present

## 2016-11-27 DIAGNOSIS — G309 Alzheimer's disease, unspecified: Secondary | ICD-10-CM | POA: Diagnosis not present

## 2016-11-27 DIAGNOSIS — M6281 Muscle weakness (generalized): Secondary | ICD-10-CM | POA: Diagnosis not present

## 2016-11-28 DIAGNOSIS — M6281 Muscle weakness (generalized): Secondary | ICD-10-CM | POA: Diagnosis not present

## 2016-11-28 DIAGNOSIS — R1314 Dysphagia, pharyngoesophageal phase: Secondary | ICD-10-CM | POA: Diagnosis not present

## 2016-11-28 DIAGNOSIS — R4189 Other symptoms and signs involving cognitive functions and awareness: Secondary | ICD-10-CM | POA: Diagnosis not present

## 2016-11-28 DIAGNOSIS — R2681 Unsteadiness on feet: Secondary | ICD-10-CM | POA: Diagnosis not present

## 2016-11-28 DIAGNOSIS — G309 Alzheimer's disease, unspecified: Secondary | ICD-10-CM | POA: Diagnosis not present

## 2016-11-28 DIAGNOSIS — R2689 Other abnormalities of gait and mobility: Secondary | ICD-10-CM | POA: Diagnosis not present

## 2016-12-01 DIAGNOSIS — R1314 Dysphagia, pharyngoesophageal phase: Secondary | ICD-10-CM | POA: Diagnosis not present

## 2016-12-01 DIAGNOSIS — R2681 Unsteadiness on feet: Secondary | ICD-10-CM | POA: Diagnosis not present

## 2016-12-01 DIAGNOSIS — R2689 Other abnormalities of gait and mobility: Secondary | ICD-10-CM | POA: Diagnosis not present

## 2016-12-01 DIAGNOSIS — M6281 Muscle weakness (generalized): Secondary | ICD-10-CM | POA: Diagnosis not present

## 2016-12-01 DIAGNOSIS — R4189 Other symptoms and signs involving cognitive functions and awareness: Secondary | ICD-10-CM | POA: Diagnosis not present

## 2016-12-01 DIAGNOSIS — G309 Alzheimer's disease, unspecified: Secondary | ICD-10-CM | POA: Diagnosis not present

## 2016-12-16 DIAGNOSIS — G301 Alzheimer's disease with late onset: Secondary | ICD-10-CM | POA: Diagnosis not present

## 2016-12-16 DIAGNOSIS — R296 Repeated falls: Secondary | ICD-10-CM | POA: Diagnosis not present

## 2016-12-22 DIAGNOSIS — R05 Cough: Secondary | ICD-10-CM | POA: Diagnosis not present

## 2017-01-05 DIAGNOSIS — I739 Peripheral vascular disease, unspecified: Secondary | ICD-10-CM | POA: Diagnosis not present

## 2017-02-05 DIAGNOSIS — I10 Essential (primary) hypertension: Secondary | ICD-10-CM | POA: Diagnosis not present

## 2017-03-19 DIAGNOSIS — G309 Alzheimer's disease, unspecified: Secondary | ICD-10-CM | POA: Diagnosis not present

## 2017-03-19 DIAGNOSIS — K219 Gastro-esophageal reflux disease without esophagitis: Secondary | ICD-10-CM | POA: Diagnosis not present

## 2017-03-19 DIAGNOSIS — R1314 Dysphagia, pharyngoesophageal phase: Secondary | ICD-10-CM | POA: Diagnosis not present

## 2017-03-19 DIAGNOSIS — I10 Essential (primary) hypertension: Secondary | ICD-10-CM | POA: Diagnosis not present

## 2017-03-19 DIAGNOSIS — R2689 Other abnormalities of gait and mobility: Secondary | ICD-10-CM | POA: Diagnosis not present

## 2017-03-26 DIAGNOSIS — L84 Corns and callosities: Secondary | ICD-10-CM | POA: Diagnosis not present

## 2017-03-26 DIAGNOSIS — M2012 Hallux valgus (acquired), left foot: Secondary | ICD-10-CM | POA: Diagnosis not present

## 2017-03-26 DIAGNOSIS — I739 Peripheral vascular disease, unspecified: Secondary | ICD-10-CM | POA: Diagnosis not present

## 2017-03-26 DIAGNOSIS — B351 Tinea unguium: Secondary | ICD-10-CM | POA: Diagnosis not present

## 2017-04-16 DIAGNOSIS — G309 Alzheimer's disease, unspecified: Secondary | ICD-10-CM | POA: Diagnosis not present

## 2017-04-16 DIAGNOSIS — I10 Essential (primary) hypertension: Secondary | ICD-10-CM | POA: Diagnosis not present

## 2017-04-16 DIAGNOSIS — R4182 Altered mental status, unspecified: Secondary | ICD-10-CM | POA: Diagnosis not present

## 2017-04-16 DIAGNOSIS — F0280 Dementia in other diseases classified elsewhere without behavioral disturbance: Secondary | ICD-10-CM | POA: Diagnosis not present

## 2017-04-19 DIAGNOSIS — F331 Major depressive disorder, recurrent, moderate: Secondary | ICD-10-CM | POA: Diagnosis not present

## 2017-04-19 DIAGNOSIS — F0280 Dementia in other diseases classified elsewhere without behavioral disturbance: Secondary | ICD-10-CM | POA: Diagnosis not present

## 2017-05-13 DIAGNOSIS — F0280 Dementia in other diseases classified elsewhere without behavioral disturbance: Secondary | ICD-10-CM | POA: Diagnosis not present

## 2017-05-13 DIAGNOSIS — I10 Essential (primary) hypertension: Secondary | ICD-10-CM | POA: Diagnosis not present

## 2017-05-13 DIAGNOSIS — G309 Alzheimer's disease, unspecified: Secondary | ICD-10-CM | POA: Diagnosis not present

## 2017-05-13 DIAGNOSIS — R4182 Altered mental status, unspecified: Secondary | ICD-10-CM | POA: Diagnosis not present

## 2017-06-07 DIAGNOSIS — F331 Major depressive disorder, recurrent, moderate: Secondary | ICD-10-CM | POA: Diagnosis not present

## 2017-06-10 DIAGNOSIS — R4182 Altered mental status, unspecified: Secondary | ICD-10-CM | POA: Diagnosis not present

## 2017-06-10 DIAGNOSIS — I10 Essential (primary) hypertension: Secondary | ICD-10-CM | POA: Diagnosis not present

## 2017-06-10 DIAGNOSIS — G309 Alzheimer's disease, unspecified: Secondary | ICD-10-CM | POA: Diagnosis not present

## 2017-06-10 DIAGNOSIS — F0280 Dementia in other diseases classified elsewhere without behavioral disturbance: Secondary | ICD-10-CM | POA: Diagnosis not present

## 2017-06-11 DIAGNOSIS — I8393 Asymptomatic varicose veins of bilateral lower extremities: Secondary | ICD-10-CM | POA: Diagnosis not present

## 2017-06-15 DIAGNOSIS — I739 Peripheral vascular disease, unspecified: Secondary | ICD-10-CM | POA: Diagnosis not present

## 2017-06-15 DIAGNOSIS — L84 Corns and callosities: Secondary | ICD-10-CM | POA: Diagnosis not present

## 2017-06-15 DIAGNOSIS — B351 Tinea unguium: Secondary | ICD-10-CM | POA: Diagnosis not present

## 2017-07-08 DIAGNOSIS — F0280 Dementia in other diseases classified elsewhere without behavioral disturbance: Secondary | ICD-10-CM | POA: Diagnosis not present

## 2017-07-08 DIAGNOSIS — G309 Alzheimer's disease, unspecified: Secondary | ICD-10-CM | POA: Diagnosis not present

## 2017-07-08 DIAGNOSIS — I739 Peripheral vascular disease, unspecified: Secondary | ICD-10-CM | POA: Diagnosis not present

## 2017-07-08 DIAGNOSIS — R2681 Unsteadiness on feet: Secondary | ICD-10-CM | POA: Diagnosis not present

## 2017-07-12 DIAGNOSIS — H04123 Dry eye syndrome of bilateral lacrimal glands: Secondary | ICD-10-CM | POA: Diagnosis not present

## 2017-07-12 DIAGNOSIS — H353131 Nonexudative age-related macular degeneration, bilateral, early dry stage: Secondary | ICD-10-CM | POA: Diagnosis not present

## 2017-07-12 DIAGNOSIS — H26493 Other secondary cataract, bilateral: Secondary | ICD-10-CM | POA: Diagnosis not present

## 2017-07-12 DIAGNOSIS — Z961 Presence of intraocular lens: Secondary | ICD-10-CM | POA: Diagnosis not present

## 2017-07-12 DIAGNOSIS — H524 Presbyopia: Secondary | ICD-10-CM | POA: Diagnosis not present

## 2017-08-11 DIAGNOSIS — E119 Type 2 diabetes mellitus without complications: Secondary | ICD-10-CM | POA: Diagnosis not present

## 2017-08-11 DIAGNOSIS — D518 Other vitamin B12 deficiency anemias: Secondary | ICD-10-CM | POA: Diagnosis not present

## 2017-08-11 DIAGNOSIS — Z79899 Other long term (current) drug therapy: Secondary | ICD-10-CM | POA: Diagnosis not present

## 2017-08-11 DIAGNOSIS — E782 Mixed hyperlipidemia: Secondary | ICD-10-CM | POA: Diagnosis not present

## 2017-08-19 DIAGNOSIS — G309 Alzheimer's disease, unspecified: Secondary | ICD-10-CM | POA: Diagnosis not present

## 2017-08-19 DIAGNOSIS — K588 Other irritable bowel syndrome: Secondary | ICD-10-CM | POA: Diagnosis not present

## 2017-08-19 DIAGNOSIS — K219 Gastro-esophageal reflux disease without esophagitis: Secondary | ICD-10-CM | POA: Diagnosis not present

## 2017-08-19 DIAGNOSIS — I10 Essential (primary) hypertension: Secondary | ICD-10-CM | POA: Diagnosis not present

## 2017-09-14 DIAGNOSIS — I739 Peripheral vascular disease, unspecified: Secondary | ICD-10-CM | POA: Diagnosis not present

## 2017-09-14 DIAGNOSIS — I10 Essential (primary) hypertension: Secondary | ICD-10-CM | POA: Diagnosis not present

## 2017-09-14 DIAGNOSIS — K588 Other irritable bowel syndrome: Secondary | ICD-10-CM | POA: Diagnosis not present

## 2017-09-14 DIAGNOSIS — K219 Gastro-esophageal reflux disease without esophagitis: Secondary | ICD-10-CM | POA: Diagnosis not present

## 2017-09-20 DIAGNOSIS — R296 Repeated falls: Secondary | ICD-10-CM | POA: Diagnosis not present

## 2017-09-20 DIAGNOSIS — R4781 Slurred speech: Secondary | ICD-10-CM | POA: Diagnosis not present

## 2017-09-21 DIAGNOSIS — L84 Corns and callosities: Secondary | ICD-10-CM | POA: Diagnosis not present

## 2017-09-21 DIAGNOSIS — B351 Tinea unguium: Secondary | ICD-10-CM | POA: Diagnosis not present

## 2017-09-21 DIAGNOSIS — I739 Peripheral vascular disease, unspecified: Secondary | ICD-10-CM | POA: Diagnosis not present

## 2017-09-22 DIAGNOSIS — G309 Alzheimer's disease, unspecified: Secondary | ICD-10-CM | POA: Diagnosis not present

## 2017-09-22 DIAGNOSIS — M6281 Muscle weakness (generalized): Secondary | ICD-10-CM | POA: Diagnosis not present

## 2017-09-22 DIAGNOSIS — R1314 Dysphagia, pharyngoesophageal phase: Secondary | ICD-10-CM | POA: Diagnosis not present

## 2017-09-22 DIAGNOSIS — R2689 Other abnormalities of gait and mobility: Secondary | ICD-10-CM | POA: Diagnosis not present

## 2017-09-22 DIAGNOSIS — R4189 Other symptoms and signs involving cognitive functions and awareness: Secondary | ICD-10-CM | POA: Diagnosis not present

## 2017-09-22 DIAGNOSIS — R2681 Unsteadiness on feet: Secondary | ICD-10-CM | POA: Diagnosis not present

## 2017-10-01 DIAGNOSIS — F331 Major depressive disorder, recurrent, moderate: Secondary | ICD-10-CM | POA: Diagnosis not present

## 2017-10-11 DIAGNOSIS — I739 Peripheral vascular disease, unspecified: Secondary | ICD-10-CM | POA: Diagnosis not present

## 2017-10-11 DIAGNOSIS — K219 Gastro-esophageal reflux disease without esophagitis: Secondary | ICD-10-CM | POA: Diagnosis not present

## 2017-10-11 DIAGNOSIS — K588 Other irritable bowel syndrome: Secondary | ICD-10-CM | POA: Diagnosis not present

## 2017-10-11 DIAGNOSIS — I10 Essential (primary) hypertension: Secondary | ICD-10-CM | POA: Diagnosis not present

## 2017-11-03 DIAGNOSIS — K219 Gastro-esophageal reflux disease without esophagitis: Secondary | ICD-10-CM | POA: Diagnosis not present

## 2017-11-03 DIAGNOSIS — I739 Peripheral vascular disease, unspecified: Secondary | ICD-10-CM | POA: Diagnosis not present

## 2017-11-03 DIAGNOSIS — K588 Other irritable bowel syndrome: Secondary | ICD-10-CM | POA: Diagnosis not present

## 2017-11-03 DIAGNOSIS — I10 Essential (primary) hypertension: Secondary | ICD-10-CM | POA: Diagnosis not present

## 2017-11-25 DIAGNOSIS — G309 Alzheimer's disease, unspecified: Secondary | ICD-10-CM | POA: Diagnosis not present

## 2017-11-25 DIAGNOSIS — M6281 Muscle weakness (generalized): Secondary | ICD-10-CM | POA: Diagnosis not present

## 2017-11-25 DIAGNOSIS — R2681 Unsteadiness on feet: Secondary | ICD-10-CM | POA: Diagnosis not present

## 2017-11-25 DIAGNOSIS — R2689 Other abnormalities of gait and mobility: Secondary | ICD-10-CM | POA: Diagnosis not present

## 2017-11-25 DIAGNOSIS — R4189 Other symptoms and signs involving cognitive functions and awareness: Secondary | ICD-10-CM | POA: Diagnosis not present

## 2017-11-25 DIAGNOSIS — R1314 Dysphagia, pharyngoesophageal phase: Secondary | ICD-10-CM | POA: Diagnosis not present

## 2017-11-29 DIAGNOSIS — R2689 Other abnormalities of gait and mobility: Secondary | ICD-10-CM | POA: Diagnosis not present

## 2017-11-29 DIAGNOSIS — M6281 Muscle weakness (generalized): Secondary | ICD-10-CM | POA: Diagnosis not present

## 2017-11-29 DIAGNOSIS — G309 Alzheimer's disease, unspecified: Secondary | ICD-10-CM | POA: Diagnosis not present

## 2017-11-29 DIAGNOSIS — R1314 Dysphagia, pharyngoesophageal phase: Secondary | ICD-10-CM | POA: Diagnosis not present

## 2017-11-29 DIAGNOSIS — R2681 Unsteadiness on feet: Secondary | ICD-10-CM | POA: Diagnosis not present

## 2017-11-29 DIAGNOSIS — R4189 Other symptoms and signs involving cognitive functions and awareness: Secondary | ICD-10-CM | POA: Diagnosis not present

## 2017-12-01 DIAGNOSIS — I10 Essential (primary) hypertension: Secondary | ICD-10-CM | POA: Diagnosis not present

## 2017-12-01 DIAGNOSIS — G309 Alzheimer's disease, unspecified: Secondary | ICD-10-CM | POA: Diagnosis not present

## 2017-12-01 DIAGNOSIS — R1314 Dysphagia, pharyngoesophageal phase: Secondary | ICD-10-CM | POA: Diagnosis not present

## 2017-12-01 DIAGNOSIS — F331 Major depressive disorder, recurrent, moderate: Secondary | ICD-10-CM | POA: Diagnosis not present

## 2017-12-01 DIAGNOSIS — R4189 Other symptoms and signs involving cognitive functions and awareness: Secondary | ICD-10-CM | POA: Diagnosis not present

## 2017-12-01 DIAGNOSIS — K588 Other irritable bowel syndrome: Secondary | ICD-10-CM | POA: Diagnosis not present

## 2017-12-01 DIAGNOSIS — M6281 Muscle weakness (generalized): Secondary | ICD-10-CM | POA: Diagnosis not present

## 2017-12-01 DIAGNOSIS — R2681 Unsteadiness on feet: Secondary | ICD-10-CM | POA: Diagnosis not present

## 2017-12-01 DIAGNOSIS — I739 Peripheral vascular disease, unspecified: Secondary | ICD-10-CM | POA: Diagnosis not present

## 2017-12-01 DIAGNOSIS — R2689 Other abnormalities of gait and mobility: Secondary | ICD-10-CM | POA: Diagnosis not present

## 2017-12-06 DIAGNOSIS — G309 Alzheimer's disease, unspecified: Secondary | ICD-10-CM | POA: Diagnosis not present

## 2017-12-06 DIAGNOSIS — R4189 Other symptoms and signs involving cognitive functions and awareness: Secondary | ICD-10-CM | POA: Diagnosis not present

## 2017-12-06 DIAGNOSIS — M6281 Muscle weakness (generalized): Secondary | ICD-10-CM | POA: Diagnosis not present

## 2017-12-06 DIAGNOSIS — R2689 Other abnormalities of gait and mobility: Secondary | ICD-10-CM | POA: Diagnosis not present

## 2017-12-06 DIAGNOSIS — R1314 Dysphagia, pharyngoesophageal phase: Secondary | ICD-10-CM | POA: Diagnosis not present

## 2017-12-06 DIAGNOSIS — R2681 Unsteadiness on feet: Secondary | ICD-10-CM | POA: Diagnosis not present

## 2017-12-08 DIAGNOSIS — R1314 Dysphagia, pharyngoesophageal phase: Secondary | ICD-10-CM | POA: Diagnosis not present

## 2017-12-08 DIAGNOSIS — R2681 Unsteadiness on feet: Secondary | ICD-10-CM | POA: Diagnosis not present

## 2017-12-08 DIAGNOSIS — M6281 Muscle weakness (generalized): Secondary | ICD-10-CM | POA: Diagnosis not present

## 2017-12-08 DIAGNOSIS — R2689 Other abnormalities of gait and mobility: Secondary | ICD-10-CM | POA: Diagnosis not present

## 2017-12-08 DIAGNOSIS — G309 Alzheimer's disease, unspecified: Secondary | ICD-10-CM | POA: Diagnosis not present

## 2017-12-08 DIAGNOSIS — R4189 Other symptoms and signs involving cognitive functions and awareness: Secondary | ICD-10-CM | POA: Diagnosis not present

## 2017-12-09 DIAGNOSIS — R2689 Other abnormalities of gait and mobility: Secondary | ICD-10-CM | POA: Diagnosis not present

## 2017-12-09 DIAGNOSIS — R1314 Dysphagia, pharyngoesophageal phase: Secondary | ICD-10-CM | POA: Diagnosis not present

## 2017-12-09 DIAGNOSIS — G309 Alzheimer's disease, unspecified: Secondary | ICD-10-CM | POA: Diagnosis not present

## 2017-12-09 DIAGNOSIS — R4189 Other symptoms and signs involving cognitive functions and awareness: Secondary | ICD-10-CM | POA: Diagnosis not present

## 2017-12-09 DIAGNOSIS — R2681 Unsteadiness on feet: Secondary | ICD-10-CM | POA: Diagnosis not present

## 2017-12-09 DIAGNOSIS — M6281 Muscle weakness (generalized): Secondary | ICD-10-CM | POA: Diagnosis not present

## 2017-12-13 DIAGNOSIS — R4189 Other symptoms and signs involving cognitive functions and awareness: Secondary | ICD-10-CM | POA: Diagnosis not present

## 2017-12-13 DIAGNOSIS — R2689 Other abnormalities of gait and mobility: Secondary | ICD-10-CM | POA: Diagnosis not present

## 2017-12-13 DIAGNOSIS — R2681 Unsteadiness on feet: Secondary | ICD-10-CM | POA: Diagnosis not present

## 2017-12-13 DIAGNOSIS — R1314 Dysphagia, pharyngoesophageal phase: Secondary | ICD-10-CM | POA: Diagnosis not present

## 2017-12-13 DIAGNOSIS — G309 Alzheimer's disease, unspecified: Secondary | ICD-10-CM | POA: Diagnosis not present

## 2017-12-13 DIAGNOSIS — M6281 Muscle weakness (generalized): Secondary | ICD-10-CM | POA: Diagnosis not present

## 2017-12-15 DIAGNOSIS — R2689 Other abnormalities of gait and mobility: Secondary | ICD-10-CM | POA: Diagnosis not present

## 2017-12-15 DIAGNOSIS — R4189 Other symptoms and signs involving cognitive functions and awareness: Secondary | ICD-10-CM | POA: Diagnosis not present

## 2017-12-15 DIAGNOSIS — G309 Alzheimer's disease, unspecified: Secondary | ICD-10-CM | POA: Diagnosis not present

## 2017-12-15 DIAGNOSIS — R1314 Dysphagia, pharyngoesophageal phase: Secondary | ICD-10-CM | POA: Diagnosis not present

## 2017-12-15 DIAGNOSIS — R2681 Unsteadiness on feet: Secondary | ICD-10-CM | POA: Diagnosis not present

## 2017-12-15 DIAGNOSIS — M6281 Muscle weakness (generalized): Secondary | ICD-10-CM | POA: Diagnosis not present

## 2017-12-17 DIAGNOSIS — R2681 Unsteadiness on feet: Secondary | ICD-10-CM | POA: Diagnosis not present

## 2017-12-17 DIAGNOSIS — R1314 Dysphagia, pharyngoesophageal phase: Secondary | ICD-10-CM | POA: Diagnosis not present

## 2017-12-17 DIAGNOSIS — M6281 Muscle weakness (generalized): Secondary | ICD-10-CM | POA: Diagnosis not present

## 2017-12-17 DIAGNOSIS — G309 Alzheimer's disease, unspecified: Secondary | ICD-10-CM | POA: Diagnosis not present

## 2017-12-17 DIAGNOSIS — R2689 Other abnormalities of gait and mobility: Secondary | ICD-10-CM | POA: Diagnosis not present

## 2017-12-17 DIAGNOSIS — R4189 Other symptoms and signs involving cognitive functions and awareness: Secondary | ICD-10-CM | POA: Diagnosis not present

## 2017-12-20 DIAGNOSIS — R1314 Dysphagia, pharyngoesophageal phase: Secondary | ICD-10-CM | POA: Diagnosis not present

## 2017-12-20 DIAGNOSIS — R2681 Unsteadiness on feet: Secondary | ICD-10-CM | POA: Diagnosis not present

## 2017-12-20 DIAGNOSIS — M6281 Muscle weakness (generalized): Secondary | ICD-10-CM | POA: Diagnosis not present

## 2017-12-20 DIAGNOSIS — G309 Alzheimer's disease, unspecified: Secondary | ICD-10-CM | POA: Diagnosis not present

## 2017-12-20 DIAGNOSIS — R4189 Other symptoms and signs involving cognitive functions and awareness: Secondary | ICD-10-CM | POA: Diagnosis not present

## 2017-12-20 DIAGNOSIS — R2689 Other abnormalities of gait and mobility: Secondary | ICD-10-CM | POA: Diagnosis not present

## 2017-12-21 DIAGNOSIS — R2681 Unsteadiness on feet: Secondary | ICD-10-CM | POA: Diagnosis not present

## 2017-12-21 DIAGNOSIS — M6281 Muscle weakness (generalized): Secondary | ICD-10-CM | POA: Diagnosis not present

## 2017-12-21 DIAGNOSIS — R4189 Other symptoms and signs involving cognitive functions and awareness: Secondary | ICD-10-CM | POA: Diagnosis not present

## 2017-12-21 DIAGNOSIS — R1314 Dysphagia, pharyngoesophageal phase: Secondary | ICD-10-CM | POA: Diagnosis not present

## 2017-12-21 DIAGNOSIS — G309 Alzheimer's disease, unspecified: Secondary | ICD-10-CM | POA: Diagnosis not present

## 2017-12-21 DIAGNOSIS — R2689 Other abnormalities of gait and mobility: Secondary | ICD-10-CM | POA: Diagnosis not present

## 2017-12-23 DIAGNOSIS — R2681 Unsteadiness on feet: Secondary | ICD-10-CM | POA: Diagnosis not present

## 2017-12-23 DIAGNOSIS — R4189 Other symptoms and signs involving cognitive functions and awareness: Secondary | ICD-10-CM | POA: Diagnosis not present

## 2017-12-23 DIAGNOSIS — R2689 Other abnormalities of gait and mobility: Secondary | ICD-10-CM | POA: Diagnosis not present

## 2017-12-23 DIAGNOSIS — R1314 Dysphagia, pharyngoesophageal phase: Secondary | ICD-10-CM | POA: Diagnosis not present

## 2017-12-23 DIAGNOSIS — G309 Alzheimer's disease, unspecified: Secondary | ICD-10-CM | POA: Diagnosis not present

## 2017-12-23 DIAGNOSIS — M6281 Muscle weakness (generalized): Secondary | ICD-10-CM | POA: Diagnosis not present

## 2017-12-27 DIAGNOSIS — F439 Reaction to severe stress, unspecified: Secondary | ICD-10-CM | POA: Diagnosis not present

## 2017-12-27 DIAGNOSIS — R2689 Other abnormalities of gait and mobility: Secondary | ICD-10-CM | POA: Diagnosis not present

## 2017-12-27 DIAGNOSIS — R2681 Unsteadiness on feet: Secondary | ICD-10-CM | POA: Diagnosis not present

## 2017-12-27 DIAGNOSIS — R4189 Other symptoms and signs involving cognitive functions and awareness: Secondary | ICD-10-CM | POA: Diagnosis not present

## 2017-12-27 DIAGNOSIS — R1314 Dysphagia, pharyngoesophageal phase: Secondary | ICD-10-CM | POA: Diagnosis not present

## 2017-12-27 DIAGNOSIS — G309 Alzheimer's disease, unspecified: Secondary | ICD-10-CM | POA: Diagnosis not present

## 2017-12-27 DIAGNOSIS — F331 Major depressive disorder, recurrent, moderate: Secondary | ICD-10-CM | POA: Diagnosis not present

## 2017-12-27 DIAGNOSIS — M6281 Muscle weakness (generalized): Secondary | ICD-10-CM | POA: Diagnosis not present

## 2017-12-30 DIAGNOSIS — R2681 Unsteadiness on feet: Secondary | ICD-10-CM | POA: Diagnosis not present

## 2017-12-30 DIAGNOSIS — R2689 Other abnormalities of gait and mobility: Secondary | ICD-10-CM | POA: Diagnosis not present

## 2017-12-30 DIAGNOSIS — M6281 Muscle weakness (generalized): Secondary | ICD-10-CM | POA: Diagnosis not present

## 2017-12-30 DIAGNOSIS — G309 Alzheimer's disease, unspecified: Secondary | ICD-10-CM | POA: Diagnosis not present

## 2017-12-30 DIAGNOSIS — R4189 Other symptoms and signs involving cognitive functions and awareness: Secondary | ICD-10-CM | POA: Diagnosis not present

## 2017-12-30 DIAGNOSIS — R1314 Dysphagia, pharyngoesophageal phase: Secondary | ICD-10-CM | POA: Diagnosis not present

## 2017-12-31 DIAGNOSIS — R4189 Other symptoms and signs involving cognitive functions and awareness: Secondary | ICD-10-CM | POA: Diagnosis not present

## 2017-12-31 DIAGNOSIS — R2681 Unsteadiness on feet: Secondary | ICD-10-CM | POA: Diagnosis not present

## 2017-12-31 DIAGNOSIS — R2689 Other abnormalities of gait and mobility: Secondary | ICD-10-CM | POA: Diagnosis not present

## 2017-12-31 DIAGNOSIS — R1314 Dysphagia, pharyngoesophageal phase: Secondary | ICD-10-CM | POA: Diagnosis not present

## 2017-12-31 DIAGNOSIS — G309 Alzheimer's disease, unspecified: Secondary | ICD-10-CM | POA: Diagnosis not present

## 2017-12-31 DIAGNOSIS — M6281 Muscle weakness (generalized): Secondary | ICD-10-CM | POA: Diagnosis not present

## 2018-01-02 DIAGNOSIS — R2681 Unsteadiness on feet: Secondary | ICD-10-CM | POA: Diagnosis not present

## 2018-01-02 DIAGNOSIS — M6281 Muscle weakness (generalized): Secondary | ICD-10-CM | POA: Diagnosis not present

## 2018-01-02 DIAGNOSIS — R1314 Dysphagia, pharyngoesophageal phase: Secondary | ICD-10-CM | POA: Diagnosis not present

## 2018-01-02 DIAGNOSIS — R4189 Other symptoms and signs involving cognitive functions and awareness: Secondary | ICD-10-CM | POA: Diagnosis not present

## 2018-01-02 DIAGNOSIS — R2689 Other abnormalities of gait and mobility: Secondary | ICD-10-CM | POA: Diagnosis not present

## 2018-01-02 DIAGNOSIS — G309 Alzheimer's disease, unspecified: Secondary | ICD-10-CM | POA: Diagnosis not present

## 2018-01-03 DIAGNOSIS — G309 Alzheimer's disease, unspecified: Secondary | ICD-10-CM | POA: Diagnosis not present

## 2018-01-03 DIAGNOSIS — R1314 Dysphagia, pharyngoesophageal phase: Secondary | ICD-10-CM | POA: Diagnosis not present

## 2018-01-03 DIAGNOSIS — M6281 Muscle weakness (generalized): Secondary | ICD-10-CM | POA: Diagnosis not present

## 2018-01-03 DIAGNOSIS — R4189 Other symptoms and signs involving cognitive functions and awareness: Secondary | ICD-10-CM | POA: Diagnosis not present

## 2018-01-03 DIAGNOSIS — R2681 Unsteadiness on feet: Secondary | ICD-10-CM | POA: Diagnosis not present

## 2018-01-03 DIAGNOSIS — R2689 Other abnormalities of gait and mobility: Secondary | ICD-10-CM | POA: Diagnosis not present

## 2018-01-04 DIAGNOSIS — R2681 Unsteadiness on feet: Secondary | ICD-10-CM | POA: Diagnosis not present

## 2018-01-04 DIAGNOSIS — R2689 Other abnormalities of gait and mobility: Secondary | ICD-10-CM | POA: Diagnosis not present

## 2018-01-04 DIAGNOSIS — R4189 Other symptoms and signs involving cognitive functions and awareness: Secondary | ICD-10-CM | POA: Diagnosis not present

## 2018-01-04 DIAGNOSIS — R1314 Dysphagia, pharyngoesophageal phase: Secondary | ICD-10-CM | POA: Diagnosis not present

## 2018-01-04 DIAGNOSIS — M6281 Muscle weakness (generalized): Secondary | ICD-10-CM | POA: Diagnosis not present

## 2018-01-04 DIAGNOSIS — G309 Alzheimer's disease, unspecified: Secondary | ICD-10-CM | POA: Diagnosis not present

## 2018-01-10 DIAGNOSIS — R2689 Other abnormalities of gait and mobility: Secondary | ICD-10-CM | POA: Diagnosis not present

## 2018-01-10 DIAGNOSIS — R1314 Dysphagia, pharyngoesophageal phase: Secondary | ICD-10-CM | POA: Diagnosis not present

## 2018-01-10 DIAGNOSIS — R4189 Other symptoms and signs involving cognitive functions and awareness: Secondary | ICD-10-CM | POA: Diagnosis not present

## 2018-01-10 DIAGNOSIS — R2681 Unsteadiness on feet: Secondary | ICD-10-CM | POA: Diagnosis not present

## 2018-01-10 DIAGNOSIS — G309 Alzheimer's disease, unspecified: Secondary | ICD-10-CM | POA: Diagnosis not present

## 2018-01-10 DIAGNOSIS — M6281 Muscle weakness (generalized): Secondary | ICD-10-CM | POA: Diagnosis not present

## 2018-01-11 DIAGNOSIS — R1314 Dysphagia, pharyngoesophageal phase: Secondary | ICD-10-CM | POA: Diagnosis not present

## 2018-01-11 DIAGNOSIS — R4189 Other symptoms and signs involving cognitive functions and awareness: Secondary | ICD-10-CM | POA: Diagnosis not present

## 2018-01-11 DIAGNOSIS — M6281 Muscle weakness (generalized): Secondary | ICD-10-CM | POA: Diagnosis not present

## 2018-01-11 DIAGNOSIS — R2689 Other abnormalities of gait and mobility: Secondary | ICD-10-CM | POA: Diagnosis not present

## 2018-01-11 DIAGNOSIS — G309 Alzheimer's disease, unspecified: Secondary | ICD-10-CM | POA: Diagnosis not present

## 2018-01-11 DIAGNOSIS — R2681 Unsteadiness on feet: Secondary | ICD-10-CM | POA: Diagnosis not present

## 2018-01-18 DIAGNOSIS — B351 Tinea unguium: Secondary | ICD-10-CM | POA: Diagnosis not present

## 2018-01-18 DIAGNOSIS — I739 Peripheral vascular disease, unspecified: Secondary | ICD-10-CM | POA: Diagnosis not present

## 2018-02-28 DIAGNOSIS — I10 Essential (primary) hypertension: Secondary | ICD-10-CM | POA: Diagnosis not present

## 2018-05-06 DIAGNOSIS — F331 Major depressive disorder, recurrent, moderate: Secondary | ICD-10-CM | POA: Diagnosis not present

## 2018-05-06 DIAGNOSIS — G309 Alzheimer's disease, unspecified: Secondary | ICD-10-CM | POA: Diagnosis not present

## 2018-05-06 DIAGNOSIS — K588 Other irritable bowel syndrome: Secondary | ICD-10-CM | POA: Diagnosis not present

## 2018-05-06 DIAGNOSIS — K219 Gastro-esophageal reflux disease without esophagitis: Secondary | ICD-10-CM | POA: Diagnosis not present

## 2018-05-10 DIAGNOSIS — I739 Peripheral vascular disease, unspecified: Secondary | ICD-10-CM | POA: Diagnosis not present

## 2018-05-10 DIAGNOSIS — B351 Tinea unguium: Secondary | ICD-10-CM | POA: Diagnosis not present

## 2018-05-26 DIAGNOSIS — R1314 Dysphagia, pharyngoesophageal phase: Secondary | ICD-10-CM | POA: Diagnosis not present

## 2018-05-26 DIAGNOSIS — R4189 Other symptoms and signs involving cognitive functions and awareness: Secondary | ICD-10-CM | POA: Diagnosis not present

## 2018-05-26 DIAGNOSIS — G309 Alzheimer's disease, unspecified: Secondary | ICD-10-CM | POA: Diagnosis not present

## 2018-05-26 DIAGNOSIS — M6281 Muscle weakness (generalized): Secondary | ICD-10-CM | POA: Diagnosis not present

## 2018-05-26 DIAGNOSIS — R2689 Other abnormalities of gait and mobility: Secondary | ICD-10-CM | POA: Diagnosis not present

## 2018-05-26 DIAGNOSIS — R2681 Unsteadiness on feet: Secondary | ICD-10-CM | POA: Diagnosis not present

## 2018-05-27 DIAGNOSIS — M6281 Muscle weakness (generalized): Secondary | ICD-10-CM | POA: Diagnosis not present

## 2018-05-27 DIAGNOSIS — R2681 Unsteadiness on feet: Secondary | ICD-10-CM | POA: Diagnosis not present

## 2018-05-27 DIAGNOSIS — G309 Alzheimer's disease, unspecified: Secondary | ICD-10-CM | POA: Diagnosis not present

## 2018-05-27 DIAGNOSIS — R1314 Dysphagia, pharyngoesophageal phase: Secondary | ICD-10-CM | POA: Diagnosis not present

## 2018-05-27 DIAGNOSIS — R2689 Other abnormalities of gait and mobility: Secondary | ICD-10-CM | POA: Diagnosis not present

## 2018-05-27 DIAGNOSIS — R4189 Other symptoms and signs involving cognitive functions and awareness: Secondary | ICD-10-CM | POA: Diagnosis not present

## 2018-05-30 DIAGNOSIS — R2689 Other abnormalities of gait and mobility: Secondary | ICD-10-CM | POA: Diagnosis not present

## 2018-05-30 DIAGNOSIS — I739 Peripheral vascular disease, unspecified: Secondary | ICD-10-CM | POA: Diagnosis not present

## 2018-05-30 DIAGNOSIS — R262 Difficulty in walking, not elsewhere classified: Secondary | ICD-10-CM | POA: Diagnosis not present

## 2018-05-30 DIAGNOSIS — M6281 Muscle weakness (generalized): Secondary | ICD-10-CM | POA: Diagnosis not present

## 2018-05-30 DIAGNOSIS — R4189 Other symptoms and signs involving cognitive functions and awareness: Secondary | ICD-10-CM | POA: Diagnosis not present

## 2018-05-30 DIAGNOSIS — R2681 Unsteadiness on feet: Secondary | ICD-10-CM | POA: Diagnosis not present

## 2018-05-30 DIAGNOSIS — R1314 Dysphagia, pharyngoesophageal phase: Secondary | ICD-10-CM | POA: Diagnosis not present

## 2018-05-31 DIAGNOSIS — R2681 Unsteadiness on feet: Secondary | ICD-10-CM | POA: Diagnosis not present

## 2018-05-31 DIAGNOSIS — M6281 Muscle weakness (generalized): Secondary | ICD-10-CM | POA: Diagnosis not present

## 2018-05-31 DIAGNOSIS — R4189 Other symptoms and signs involving cognitive functions and awareness: Secondary | ICD-10-CM | POA: Diagnosis not present

## 2018-05-31 DIAGNOSIS — R1314 Dysphagia, pharyngoesophageal phase: Secondary | ICD-10-CM | POA: Diagnosis not present

## 2018-05-31 DIAGNOSIS — R262 Difficulty in walking, not elsewhere classified: Secondary | ICD-10-CM | POA: Diagnosis not present

## 2018-05-31 DIAGNOSIS — R2689 Other abnormalities of gait and mobility: Secondary | ICD-10-CM | POA: Diagnosis not present

## 2018-05-31 DIAGNOSIS — I739 Peripheral vascular disease, unspecified: Secondary | ICD-10-CM | POA: Diagnosis not present

## 2018-06-01 DIAGNOSIS — R2681 Unsteadiness on feet: Secondary | ICD-10-CM | POA: Diagnosis not present

## 2018-06-01 DIAGNOSIS — M6281 Muscle weakness (generalized): Secondary | ICD-10-CM | POA: Diagnosis not present

## 2018-06-01 DIAGNOSIS — R2689 Other abnormalities of gait and mobility: Secondary | ICD-10-CM | POA: Diagnosis not present

## 2018-06-01 DIAGNOSIS — R262 Difficulty in walking, not elsewhere classified: Secondary | ICD-10-CM | POA: Diagnosis not present

## 2018-06-01 DIAGNOSIS — R1314 Dysphagia, pharyngoesophageal phase: Secondary | ICD-10-CM | POA: Diagnosis not present

## 2018-06-01 DIAGNOSIS — R4189 Other symptoms and signs involving cognitive functions and awareness: Secondary | ICD-10-CM | POA: Diagnosis not present

## 2018-06-01 DIAGNOSIS — I739 Peripheral vascular disease, unspecified: Secondary | ICD-10-CM | POA: Diagnosis not present

## 2018-06-02 DIAGNOSIS — R262 Difficulty in walking, not elsewhere classified: Secondary | ICD-10-CM | POA: Diagnosis not present

## 2018-06-02 DIAGNOSIS — R4189 Other symptoms and signs involving cognitive functions and awareness: Secondary | ICD-10-CM | POA: Diagnosis not present

## 2018-06-02 DIAGNOSIS — R2681 Unsteadiness on feet: Secondary | ICD-10-CM | POA: Diagnosis not present

## 2018-06-02 DIAGNOSIS — M6281 Muscle weakness (generalized): Secondary | ICD-10-CM | POA: Diagnosis not present

## 2018-06-02 DIAGNOSIS — R1314 Dysphagia, pharyngoesophageal phase: Secondary | ICD-10-CM | POA: Diagnosis not present

## 2018-06-02 DIAGNOSIS — I739 Peripheral vascular disease, unspecified: Secondary | ICD-10-CM | POA: Diagnosis not present

## 2018-06-02 DIAGNOSIS — R2689 Other abnormalities of gait and mobility: Secondary | ICD-10-CM | POA: Diagnosis not present

## 2018-06-03 DIAGNOSIS — R4189 Other symptoms and signs involving cognitive functions and awareness: Secondary | ICD-10-CM | POA: Diagnosis not present

## 2018-06-03 DIAGNOSIS — R1314 Dysphagia, pharyngoesophageal phase: Secondary | ICD-10-CM | POA: Diagnosis not present

## 2018-06-03 DIAGNOSIS — R2681 Unsteadiness on feet: Secondary | ICD-10-CM | POA: Diagnosis not present

## 2018-06-03 DIAGNOSIS — M6281 Muscle weakness (generalized): Secondary | ICD-10-CM | POA: Diagnosis not present

## 2018-06-03 DIAGNOSIS — I739 Peripheral vascular disease, unspecified: Secondary | ICD-10-CM | POA: Diagnosis not present

## 2018-06-03 DIAGNOSIS — R262 Difficulty in walking, not elsewhere classified: Secondary | ICD-10-CM | POA: Diagnosis not present

## 2018-06-03 DIAGNOSIS — R2689 Other abnormalities of gait and mobility: Secondary | ICD-10-CM | POA: Diagnosis not present

## 2018-06-06 DIAGNOSIS — R2689 Other abnormalities of gait and mobility: Secondary | ICD-10-CM | POA: Diagnosis not present

## 2018-06-06 DIAGNOSIS — R2681 Unsteadiness on feet: Secondary | ICD-10-CM | POA: Diagnosis not present

## 2018-06-06 DIAGNOSIS — R262 Difficulty in walking, not elsewhere classified: Secondary | ICD-10-CM | POA: Diagnosis not present

## 2018-06-06 DIAGNOSIS — I739 Peripheral vascular disease, unspecified: Secondary | ICD-10-CM | POA: Diagnosis not present

## 2018-06-06 DIAGNOSIS — R4189 Other symptoms and signs involving cognitive functions and awareness: Secondary | ICD-10-CM | POA: Diagnosis not present

## 2018-06-06 DIAGNOSIS — M6281 Muscle weakness (generalized): Secondary | ICD-10-CM | POA: Diagnosis not present

## 2018-06-06 DIAGNOSIS — R1314 Dysphagia, pharyngoesophageal phase: Secondary | ICD-10-CM | POA: Diagnosis not present

## 2018-06-07 DIAGNOSIS — M6281 Muscle weakness (generalized): Secondary | ICD-10-CM | POA: Diagnosis not present

## 2018-06-07 DIAGNOSIS — R2681 Unsteadiness on feet: Secondary | ICD-10-CM | POA: Diagnosis not present

## 2018-06-07 DIAGNOSIS — R2689 Other abnormalities of gait and mobility: Secondary | ICD-10-CM | POA: Diagnosis not present

## 2018-06-07 DIAGNOSIS — R1314 Dysphagia, pharyngoesophageal phase: Secondary | ICD-10-CM | POA: Diagnosis not present

## 2018-06-07 DIAGNOSIS — R4189 Other symptoms and signs involving cognitive functions and awareness: Secondary | ICD-10-CM | POA: Diagnosis not present

## 2018-06-07 DIAGNOSIS — R262 Difficulty in walking, not elsewhere classified: Secondary | ICD-10-CM | POA: Diagnosis not present

## 2018-06-07 DIAGNOSIS — I739 Peripheral vascular disease, unspecified: Secondary | ICD-10-CM | POA: Diagnosis not present

## 2018-06-08 DIAGNOSIS — R2689 Other abnormalities of gait and mobility: Secondary | ICD-10-CM | POA: Diagnosis not present

## 2018-06-08 DIAGNOSIS — R1314 Dysphagia, pharyngoesophageal phase: Secondary | ICD-10-CM | POA: Diagnosis not present

## 2018-06-08 DIAGNOSIS — I739 Peripheral vascular disease, unspecified: Secondary | ICD-10-CM | POA: Diagnosis not present

## 2018-06-08 DIAGNOSIS — M6281 Muscle weakness (generalized): Secondary | ICD-10-CM | POA: Diagnosis not present

## 2018-06-08 DIAGNOSIS — R4189 Other symptoms and signs involving cognitive functions and awareness: Secondary | ICD-10-CM | POA: Diagnosis not present

## 2018-06-08 DIAGNOSIS — R262 Difficulty in walking, not elsewhere classified: Secondary | ICD-10-CM | POA: Diagnosis not present

## 2018-06-08 DIAGNOSIS — R2681 Unsteadiness on feet: Secondary | ICD-10-CM | POA: Diagnosis not present

## 2018-06-09 DIAGNOSIS — R4189 Other symptoms and signs involving cognitive functions and awareness: Secondary | ICD-10-CM | POA: Diagnosis not present

## 2018-06-09 DIAGNOSIS — M6281 Muscle weakness (generalized): Secondary | ICD-10-CM | POA: Diagnosis not present

## 2018-06-09 DIAGNOSIS — I739 Peripheral vascular disease, unspecified: Secondary | ICD-10-CM | POA: Diagnosis not present

## 2018-06-09 DIAGNOSIS — R2681 Unsteadiness on feet: Secondary | ICD-10-CM | POA: Diagnosis not present

## 2018-06-09 DIAGNOSIS — R262 Difficulty in walking, not elsewhere classified: Secondary | ICD-10-CM | POA: Diagnosis not present

## 2018-06-09 DIAGNOSIS — R1314 Dysphagia, pharyngoesophageal phase: Secondary | ICD-10-CM | POA: Diagnosis not present

## 2018-06-09 DIAGNOSIS — R2689 Other abnormalities of gait and mobility: Secondary | ICD-10-CM | POA: Diagnosis not present

## 2018-06-10 DIAGNOSIS — R2681 Unsteadiness on feet: Secondary | ICD-10-CM | POA: Diagnosis not present

## 2018-06-10 DIAGNOSIS — I739 Peripheral vascular disease, unspecified: Secondary | ICD-10-CM | POA: Diagnosis not present

## 2018-06-10 DIAGNOSIS — R1314 Dysphagia, pharyngoesophageal phase: Secondary | ICD-10-CM | POA: Diagnosis not present

## 2018-06-10 DIAGNOSIS — M6281 Muscle weakness (generalized): Secondary | ICD-10-CM | POA: Diagnosis not present

## 2018-06-10 DIAGNOSIS — R262 Difficulty in walking, not elsewhere classified: Secondary | ICD-10-CM | POA: Diagnosis not present

## 2018-06-10 DIAGNOSIS — R2689 Other abnormalities of gait and mobility: Secondary | ICD-10-CM | POA: Diagnosis not present

## 2018-06-10 DIAGNOSIS — R4189 Other symptoms and signs involving cognitive functions and awareness: Secondary | ICD-10-CM | POA: Diagnosis not present

## 2018-07-26 DIAGNOSIS — H524 Presbyopia: Secondary | ICD-10-CM | POA: Diagnosis not present

## 2018-07-26 DIAGNOSIS — Z961 Presence of intraocular lens: Secondary | ICD-10-CM | POA: Diagnosis not present

## 2018-07-26 DIAGNOSIS — H353131 Nonexudative age-related macular degeneration, bilateral, early dry stage: Secondary | ICD-10-CM | POA: Diagnosis not present

## 2018-07-26 DIAGNOSIS — H26493 Other secondary cataract, bilateral: Secondary | ICD-10-CM | POA: Diagnosis not present

## 2018-07-26 DIAGNOSIS — H04123 Dry eye syndrome of bilateral lacrimal glands: Secondary | ICD-10-CM | POA: Diagnosis not present

## 2018-08-24 DIAGNOSIS — I10 Essential (primary) hypertension: Secondary | ICD-10-CM | POA: Diagnosis not present

## 2018-08-31 DIAGNOSIS — B351 Tinea unguium: Secondary | ICD-10-CM | POA: Diagnosis not present

## 2018-09-05 DIAGNOSIS — F411 Generalized anxiety disorder: Secondary | ICD-10-CM | POA: Diagnosis not present

## 2018-09-05 DIAGNOSIS — G301 Alzheimer's disease with late onset: Secondary | ICD-10-CM | POA: Diagnosis not present

## 2018-09-05 DIAGNOSIS — F331 Major depressive disorder, recurrent, moderate: Secondary | ICD-10-CM | POA: Diagnosis not present

## 2018-09-05 DIAGNOSIS — F0280 Dementia in other diseases classified elsewhere without behavioral disturbance: Secondary | ICD-10-CM | POA: Diagnosis not present

## 2018-10-17 DIAGNOSIS — F0280 Dementia in other diseases classified elsewhere without behavioral disturbance: Secondary | ICD-10-CM | POA: Diagnosis not present

## 2018-10-17 DIAGNOSIS — F411 Generalized anxiety disorder: Secondary | ICD-10-CM | POA: Diagnosis not present

## 2018-10-17 DIAGNOSIS — F331 Major depressive disorder, recurrent, moderate: Secondary | ICD-10-CM | POA: Diagnosis not present

## 2018-10-17 DIAGNOSIS — G301 Alzheimer's disease with late onset: Secondary | ICD-10-CM | POA: Diagnosis not present

## 2018-10-24 DIAGNOSIS — G309 Alzheimer's disease, unspecified: Secondary | ICD-10-CM | POA: Diagnosis not present

## 2018-10-24 DIAGNOSIS — K588 Other irritable bowel syndrome: Secondary | ICD-10-CM | POA: Diagnosis not present

## 2018-10-24 DIAGNOSIS — K219 Gastro-esophageal reflux disease without esophagitis: Secondary | ICD-10-CM | POA: Diagnosis not present

## 2018-10-24 DIAGNOSIS — I739 Peripheral vascular disease, unspecified: Secondary | ICD-10-CM | POA: Diagnosis not present

## 2018-11-16 DIAGNOSIS — K588 Other irritable bowel syndrome: Secondary | ICD-10-CM | POA: Diagnosis not present

## 2018-11-16 DIAGNOSIS — I739 Peripheral vascular disease, unspecified: Secondary | ICD-10-CM | POA: Diagnosis not present

## 2018-11-16 DIAGNOSIS — K219 Gastro-esophageal reflux disease without esophagitis: Secondary | ICD-10-CM | POA: Diagnosis not present

## 2018-11-16 DIAGNOSIS — G309 Alzheimer's disease, unspecified: Secondary | ICD-10-CM | POA: Diagnosis not present

## 2018-11-21 DIAGNOSIS — F411 Generalized anxiety disorder: Secondary | ICD-10-CM | POA: Diagnosis not present

## 2018-11-21 DIAGNOSIS — F331 Major depressive disorder, recurrent, moderate: Secondary | ICD-10-CM | POA: Diagnosis not present

## 2018-11-21 DIAGNOSIS — G301 Alzheimer's disease with late onset: Secondary | ICD-10-CM | POA: Diagnosis not present

## 2018-11-21 DIAGNOSIS — F0280 Dementia in other diseases classified elsewhere without behavioral disturbance: Secondary | ICD-10-CM | POA: Diagnosis not present

## 2018-11-28 DIAGNOSIS — R262 Difficulty in walking, not elsewhere classified: Secondary | ICD-10-CM | POA: Diagnosis not present

## 2018-11-28 DIAGNOSIS — I739 Peripheral vascular disease, unspecified: Secondary | ICD-10-CM | POA: Diagnosis not present

## 2018-11-28 DIAGNOSIS — M6281 Muscle weakness (generalized): Secondary | ICD-10-CM | POA: Diagnosis not present

## 2018-12-01 DIAGNOSIS — R1312 Dysphagia, oropharyngeal phase: Secondary | ICD-10-CM | POA: Diagnosis not present

## 2018-12-01 DIAGNOSIS — R262 Difficulty in walking, not elsewhere classified: Secondary | ICD-10-CM | POA: Diagnosis not present

## 2018-12-01 DIAGNOSIS — M6281 Muscle weakness (generalized): Secondary | ICD-10-CM | POA: Diagnosis not present

## 2018-12-01 DIAGNOSIS — I739 Peripheral vascular disease, unspecified: Secondary | ICD-10-CM | POA: Diagnosis not present

## 2018-12-06 DIAGNOSIS — F0280 Dementia in other diseases classified elsewhere without behavioral disturbance: Secondary | ICD-10-CM | POA: Diagnosis not present

## 2018-12-06 DIAGNOSIS — G301 Alzheimer's disease with late onset: Secondary | ICD-10-CM | POA: Diagnosis not present

## 2018-12-06 DIAGNOSIS — F331 Major depressive disorder, recurrent, moderate: Secondary | ICD-10-CM | POA: Diagnosis not present

## 2018-12-06 DIAGNOSIS — F411 Generalized anxiety disorder: Secondary | ICD-10-CM | POA: Diagnosis not present

## 2018-12-07 DIAGNOSIS — I739 Peripheral vascular disease, unspecified: Secondary | ICD-10-CM | POA: Diagnosis not present

## 2018-12-07 DIAGNOSIS — R262 Difficulty in walking, not elsewhere classified: Secondary | ICD-10-CM | POA: Diagnosis not present

## 2018-12-07 DIAGNOSIS — M6281 Muscle weakness (generalized): Secondary | ICD-10-CM | POA: Diagnosis not present

## 2018-12-07 DIAGNOSIS — R1312 Dysphagia, oropharyngeal phase: Secondary | ICD-10-CM | POA: Diagnosis not present

## 2018-12-09 DIAGNOSIS — I739 Peripheral vascular disease, unspecified: Secondary | ICD-10-CM | POA: Diagnosis not present

## 2018-12-09 DIAGNOSIS — R262 Difficulty in walking, not elsewhere classified: Secondary | ICD-10-CM | POA: Diagnosis not present

## 2018-12-09 DIAGNOSIS — M6281 Muscle weakness (generalized): Secondary | ICD-10-CM | POA: Diagnosis not present

## 2018-12-09 DIAGNOSIS — R1312 Dysphagia, oropharyngeal phase: Secondary | ICD-10-CM | POA: Diagnosis not present

## 2018-12-12 DIAGNOSIS — K588 Other irritable bowel syndrome: Secondary | ICD-10-CM | POA: Diagnosis not present

## 2018-12-12 DIAGNOSIS — K219 Gastro-esophageal reflux disease without esophagitis: Secondary | ICD-10-CM | POA: Diagnosis not present

## 2018-12-12 DIAGNOSIS — G309 Alzheimer's disease, unspecified: Secondary | ICD-10-CM | POA: Diagnosis not present

## 2018-12-12 DIAGNOSIS — I739 Peripheral vascular disease, unspecified: Secondary | ICD-10-CM | POA: Diagnosis not present

## 2018-12-13 DIAGNOSIS — F0280 Dementia in other diseases classified elsewhere without behavioral disturbance: Secondary | ICD-10-CM | POA: Diagnosis not present

## 2018-12-13 DIAGNOSIS — R1312 Dysphagia, oropharyngeal phase: Secondary | ICD-10-CM | POA: Diagnosis not present

## 2018-12-13 DIAGNOSIS — F411 Generalized anxiety disorder: Secondary | ICD-10-CM | POA: Diagnosis not present

## 2018-12-13 DIAGNOSIS — I739 Peripheral vascular disease, unspecified: Secondary | ICD-10-CM | POA: Diagnosis not present

## 2018-12-13 DIAGNOSIS — R262 Difficulty in walking, not elsewhere classified: Secondary | ICD-10-CM | POA: Diagnosis not present

## 2018-12-13 DIAGNOSIS — M6281 Muscle weakness (generalized): Secondary | ICD-10-CM | POA: Diagnosis not present

## 2018-12-13 DIAGNOSIS — G301 Alzheimer's disease with late onset: Secondary | ICD-10-CM | POA: Diagnosis not present

## 2018-12-13 DIAGNOSIS — F331 Major depressive disorder, recurrent, moderate: Secondary | ICD-10-CM | POA: Diagnosis not present

## 2018-12-15 DIAGNOSIS — M6281 Muscle weakness (generalized): Secondary | ICD-10-CM | POA: Diagnosis not present

## 2018-12-15 DIAGNOSIS — R1312 Dysphagia, oropharyngeal phase: Secondary | ICD-10-CM | POA: Diagnosis not present

## 2018-12-15 DIAGNOSIS — I739 Peripheral vascular disease, unspecified: Secondary | ICD-10-CM | POA: Diagnosis not present

## 2018-12-15 DIAGNOSIS — R262 Difficulty in walking, not elsewhere classified: Secondary | ICD-10-CM | POA: Diagnosis not present

## 2018-12-16 DIAGNOSIS — R262 Difficulty in walking, not elsewhere classified: Secondary | ICD-10-CM | POA: Diagnosis not present

## 2018-12-16 DIAGNOSIS — M6281 Muscle weakness (generalized): Secondary | ICD-10-CM | POA: Diagnosis not present

## 2018-12-16 DIAGNOSIS — R1312 Dysphagia, oropharyngeal phase: Secondary | ICD-10-CM | POA: Diagnosis not present

## 2018-12-16 DIAGNOSIS — I739 Peripheral vascular disease, unspecified: Secondary | ICD-10-CM | POA: Diagnosis not present

## 2018-12-20 DIAGNOSIS — M6281 Muscle weakness (generalized): Secondary | ICD-10-CM | POA: Diagnosis not present

## 2018-12-20 DIAGNOSIS — R1312 Dysphagia, oropharyngeal phase: Secondary | ICD-10-CM | POA: Diagnosis not present

## 2018-12-20 DIAGNOSIS — R262 Difficulty in walking, not elsewhere classified: Secondary | ICD-10-CM | POA: Diagnosis not present

## 2018-12-20 DIAGNOSIS — I739 Peripheral vascular disease, unspecified: Secondary | ICD-10-CM | POA: Diagnosis not present

## 2018-12-21 DIAGNOSIS — M6281 Muscle weakness (generalized): Secondary | ICD-10-CM | POA: Diagnosis not present

## 2018-12-21 DIAGNOSIS — I739 Peripheral vascular disease, unspecified: Secondary | ICD-10-CM | POA: Diagnosis not present

## 2018-12-21 DIAGNOSIS — R262 Difficulty in walking, not elsewhere classified: Secondary | ICD-10-CM | POA: Diagnosis not present

## 2018-12-21 DIAGNOSIS — R1312 Dysphagia, oropharyngeal phase: Secondary | ICD-10-CM | POA: Diagnosis not present

## 2018-12-24 DIAGNOSIS — I739 Peripheral vascular disease, unspecified: Secondary | ICD-10-CM | POA: Diagnosis not present

## 2018-12-24 DIAGNOSIS — R262 Difficulty in walking, not elsewhere classified: Secondary | ICD-10-CM | POA: Diagnosis not present

## 2018-12-24 DIAGNOSIS — M6281 Muscle weakness (generalized): Secondary | ICD-10-CM | POA: Diagnosis not present

## 2018-12-24 DIAGNOSIS — R1312 Dysphagia, oropharyngeal phase: Secondary | ICD-10-CM | POA: Diagnosis not present

## 2018-12-27 DIAGNOSIS — F0280 Dementia in other diseases classified elsewhere without behavioral disturbance: Secondary | ICD-10-CM | POA: Diagnosis not present

## 2018-12-27 DIAGNOSIS — G301 Alzheimer's disease with late onset: Secondary | ICD-10-CM | POA: Diagnosis not present

## 2018-12-27 DIAGNOSIS — R262 Difficulty in walking, not elsewhere classified: Secondary | ICD-10-CM | POA: Diagnosis not present

## 2018-12-27 DIAGNOSIS — F331 Major depressive disorder, recurrent, moderate: Secondary | ICD-10-CM | POA: Diagnosis not present

## 2018-12-27 DIAGNOSIS — F411 Generalized anxiety disorder: Secondary | ICD-10-CM | POA: Diagnosis not present

## 2018-12-27 DIAGNOSIS — M6281 Muscle weakness (generalized): Secondary | ICD-10-CM | POA: Diagnosis not present

## 2018-12-27 DIAGNOSIS — I739 Peripheral vascular disease, unspecified: Secondary | ICD-10-CM | POA: Diagnosis not present

## 2018-12-27 DIAGNOSIS — R1312 Dysphagia, oropharyngeal phase: Secondary | ICD-10-CM | POA: Diagnosis not present

## 2018-12-28 DIAGNOSIS — R1312 Dysphagia, oropharyngeal phase: Secondary | ICD-10-CM | POA: Diagnosis not present

## 2018-12-28 DIAGNOSIS — I739 Peripheral vascular disease, unspecified: Secondary | ICD-10-CM | POA: Diagnosis not present

## 2018-12-28 DIAGNOSIS — B351 Tinea unguium: Secondary | ICD-10-CM | POA: Diagnosis not present

## 2018-12-28 DIAGNOSIS — R262 Difficulty in walking, not elsewhere classified: Secondary | ICD-10-CM | POA: Diagnosis not present

## 2018-12-28 DIAGNOSIS — M6281 Muscle weakness (generalized): Secondary | ICD-10-CM | POA: Diagnosis not present

## 2018-12-30 DIAGNOSIS — I739 Peripheral vascular disease, unspecified: Secondary | ICD-10-CM | POA: Diagnosis not present

## 2018-12-30 DIAGNOSIS — R262 Difficulty in walking, not elsewhere classified: Secondary | ICD-10-CM | POA: Diagnosis not present

## 2018-12-30 DIAGNOSIS — M6281 Muscle weakness (generalized): Secondary | ICD-10-CM | POA: Diagnosis not present

## 2018-12-30 DIAGNOSIS — R1312 Dysphagia, oropharyngeal phase: Secondary | ICD-10-CM | POA: Diagnosis not present

## 2019-01-04 DIAGNOSIS — M6281 Muscle weakness (generalized): Secondary | ICD-10-CM | POA: Diagnosis not present

## 2019-01-04 DIAGNOSIS — F0280 Dementia in other diseases classified elsewhere without behavioral disturbance: Secondary | ICD-10-CM | POA: Diagnosis not present

## 2019-01-04 DIAGNOSIS — F411 Generalized anxiety disorder: Secondary | ICD-10-CM | POA: Diagnosis not present

## 2019-01-04 DIAGNOSIS — I739 Peripheral vascular disease, unspecified: Secondary | ICD-10-CM | POA: Diagnosis not present

## 2019-01-04 DIAGNOSIS — R262 Difficulty in walking, not elsewhere classified: Secondary | ICD-10-CM | POA: Diagnosis not present

## 2019-01-04 DIAGNOSIS — F331 Major depressive disorder, recurrent, moderate: Secondary | ICD-10-CM | POA: Diagnosis not present

## 2019-01-04 DIAGNOSIS — G301 Alzheimer's disease with late onset: Secondary | ICD-10-CM | POA: Diagnosis not present

## 2019-01-05 DIAGNOSIS — M6281 Muscle weakness (generalized): Secondary | ICD-10-CM | POA: Diagnosis not present

## 2019-01-05 DIAGNOSIS — I739 Peripheral vascular disease, unspecified: Secondary | ICD-10-CM | POA: Diagnosis not present

## 2019-01-05 DIAGNOSIS — R262 Difficulty in walking, not elsewhere classified: Secondary | ICD-10-CM | POA: Diagnosis not present

## 2019-01-06 DIAGNOSIS — M6281 Muscle weakness (generalized): Secondary | ICD-10-CM | POA: Diagnosis not present

## 2019-01-06 DIAGNOSIS — R262 Difficulty in walking, not elsewhere classified: Secondary | ICD-10-CM | POA: Diagnosis not present

## 2019-01-06 DIAGNOSIS — I739 Peripheral vascular disease, unspecified: Secondary | ICD-10-CM | POA: Diagnosis not present

## 2019-01-08 DIAGNOSIS — R262 Difficulty in walking, not elsewhere classified: Secondary | ICD-10-CM | POA: Diagnosis not present

## 2019-01-08 DIAGNOSIS — I739 Peripheral vascular disease, unspecified: Secondary | ICD-10-CM | POA: Diagnosis not present

## 2019-01-08 DIAGNOSIS — M6281 Muscle weakness (generalized): Secondary | ICD-10-CM | POA: Diagnosis not present

## 2019-01-10 DIAGNOSIS — F331 Major depressive disorder, recurrent, moderate: Secondary | ICD-10-CM | POA: Diagnosis not present

## 2019-01-10 DIAGNOSIS — G301 Alzheimer's disease with late onset: Secondary | ICD-10-CM | POA: Diagnosis not present

## 2019-01-10 DIAGNOSIS — F411 Generalized anxiety disorder: Secondary | ICD-10-CM | POA: Diagnosis not present

## 2019-01-10 DIAGNOSIS — F0280 Dementia in other diseases classified elsewhere without behavioral disturbance: Secondary | ICD-10-CM | POA: Diagnosis not present

## 2019-01-11 DIAGNOSIS — I10 Essential (primary) hypertension: Secondary | ICD-10-CM | POA: Diagnosis not present

## 2019-01-11 DIAGNOSIS — K588 Other irritable bowel syndrome: Secondary | ICD-10-CM | POA: Diagnosis not present

## 2019-01-11 DIAGNOSIS — F329 Major depressive disorder, single episode, unspecified: Secondary | ICD-10-CM | POA: Diagnosis not present

## 2019-01-11 DIAGNOSIS — G309 Alzheimer's disease, unspecified: Secondary | ICD-10-CM | POA: Diagnosis not present

## 2019-01-12 DIAGNOSIS — R262 Difficulty in walking, not elsewhere classified: Secondary | ICD-10-CM | POA: Diagnosis not present

## 2019-01-12 DIAGNOSIS — M6281 Muscle weakness (generalized): Secondary | ICD-10-CM | POA: Diagnosis not present

## 2019-01-12 DIAGNOSIS — I739 Peripheral vascular disease, unspecified: Secondary | ICD-10-CM | POA: Diagnosis not present

## 2019-01-13 DIAGNOSIS — A0472 Enterocolitis due to Clostridium difficile, not specified as recurrent: Secondary | ICD-10-CM | POA: Diagnosis not present

## 2019-01-13 DIAGNOSIS — A0471 Enterocolitis due to Clostridium difficile, recurrent: Secondary | ICD-10-CM | POA: Diagnosis not present

## 2019-01-14 DIAGNOSIS — I739 Peripheral vascular disease, unspecified: Secondary | ICD-10-CM | POA: Diagnosis not present

## 2019-01-14 DIAGNOSIS — M6281 Muscle weakness (generalized): Secondary | ICD-10-CM | POA: Diagnosis not present

## 2019-01-14 DIAGNOSIS — R262 Difficulty in walking, not elsewhere classified: Secondary | ICD-10-CM | POA: Diagnosis not present

## 2019-01-17 DIAGNOSIS — R262 Difficulty in walking, not elsewhere classified: Secondary | ICD-10-CM | POA: Diagnosis not present

## 2019-01-17 DIAGNOSIS — I739 Peripheral vascular disease, unspecified: Secondary | ICD-10-CM | POA: Diagnosis not present

## 2019-01-17 DIAGNOSIS — M6281 Muscle weakness (generalized): Secondary | ICD-10-CM | POA: Diagnosis not present

## 2019-01-18 DIAGNOSIS — F331 Major depressive disorder, recurrent, moderate: Secondary | ICD-10-CM | POA: Diagnosis not present

## 2019-01-18 DIAGNOSIS — F0280 Dementia in other diseases classified elsewhere without behavioral disturbance: Secondary | ICD-10-CM | POA: Diagnosis not present

## 2019-01-18 DIAGNOSIS — D649 Anemia, unspecified: Secondary | ICD-10-CM | POA: Diagnosis not present

## 2019-01-18 DIAGNOSIS — G301 Alzheimer's disease with late onset: Secondary | ICD-10-CM | POA: Diagnosis not present

## 2019-01-18 DIAGNOSIS — F411 Generalized anxiety disorder: Secondary | ICD-10-CM | POA: Diagnosis not present

## 2019-01-18 DIAGNOSIS — I10 Essential (primary) hypertension: Secondary | ICD-10-CM | POA: Diagnosis not present

## 2019-01-24 DIAGNOSIS — F411 Generalized anxiety disorder: Secondary | ICD-10-CM | POA: Diagnosis not present

## 2019-01-24 DIAGNOSIS — G301 Alzheimer's disease with late onset: Secondary | ICD-10-CM | POA: Diagnosis not present

## 2019-01-24 DIAGNOSIS — F331 Major depressive disorder, recurrent, moderate: Secondary | ICD-10-CM | POA: Diagnosis not present

## 2019-01-24 DIAGNOSIS — F0280 Dementia in other diseases classified elsewhere without behavioral disturbance: Secondary | ICD-10-CM | POA: Diagnosis not present

## 2019-01-25 DIAGNOSIS — D519 Vitamin B12 deficiency anemia, unspecified: Secondary | ICD-10-CM | POA: Diagnosis not present

## 2019-01-25 DIAGNOSIS — I10 Essential (primary) hypertension: Secondary | ICD-10-CM | POA: Diagnosis not present

## 2019-01-25 DIAGNOSIS — D649 Anemia, unspecified: Secondary | ICD-10-CM | POA: Diagnosis not present

## 2019-01-30 DIAGNOSIS — G301 Alzheimer's disease with late onset: Secondary | ICD-10-CM | POA: Diagnosis not present

## 2019-01-30 DIAGNOSIS — F411 Generalized anxiety disorder: Secondary | ICD-10-CM | POA: Diagnosis not present

## 2019-01-30 DIAGNOSIS — F331 Major depressive disorder, recurrent, moderate: Secondary | ICD-10-CM | POA: Diagnosis not present

## 2019-01-30 DIAGNOSIS — F0280 Dementia in other diseases classified elsewhere without behavioral disturbance: Secondary | ICD-10-CM | POA: Diagnosis not present

## 2019-01-31 DIAGNOSIS — F331 Major depressive disorder, recurrent, moderate: Secondary | ICD-10-CM | POA: Diagnosis not present

## 2019-01-31 DIAGNOSIS — G301 Alzheimer's disease with late onset: Secondary | ICD-10-CM | POA: Diagnosis not present

## 2019-01-31 DIAGNOSIS — F411 Generalized anxiety disorder: Secondary | ICD-10-CM | POA: Diagnosis not present

## 2019-01-31 DIAGNOSIS — F0280 Dementia in other diseases classified elsewhere without behavioral disturbance: Secondary | ICD-10-CM | POA: Diagnosis not present

## 2019-02-06 DIAGNOSIS — D649 Anemia, unspecified: Secondary | ICD-10-CM | POA: Diagnosis not present

## 2019-02-06 DIAGNOSIS — I10 Essential (primary) hypertension: Secondary | ICD-10-CM | POA: Diagnosis not present

## 2019-02-08 DIAGNOSIS — G309 Alzheimer's disease, unspecified: Secondary | ICD-10-CM | POA: Diagnosis not present

## 2019-02-08 DIAGNOSIS — I10 Essential (primary) hypertension: Secondary | ICD-10-CM | POA: Diagnosis not present

## 2019-02-08 DIAGNOSIS — G44209 Tension-type headache, unspecified, not intractable: Secondary | ICD-10-CM | POA: Diagnosis not present

## 2019-02-08 DIAGNOSIS — H6120 Impacted cerumen, unspecified ear: Secondary | ICD-10-CM | POA: Diagnosis not present

## 2019-02-17 DIAGNOSIS — F331 Major depressive disorder, recurrent, moderate: Secondary | ICD-10-CM | POA: Diagnosis not present

## 2019-02-17 DIAGNOSIS — K219 Gastro-esophageal reflux disease without esophagitis: Secondary | ICD-10-CM | POA: Diagnosis not present

## 2019-02-17 DIAGNOSIS — G309 Alzheimer's disease, unspecified: Secondary | ICD-10-CM | POA: Diagnosis not present

## 2019-02-17 DIAGNOSIS — G44209 Tension-type headache, unspecified, not intractable: Secondary | ICD-10-CM | POA: Diagnosis not present

## 2019-02-20 DIAGNOSIS — I10 Essential (primary) hypertension: Secondary | ICD-10-CM | POA: Diagnosis not present

## 2019-02-20 DIAGNOSIS — D649 Anemia, unspecified: Secondary | ICD-10-CM | POA: Diagnosis not present

## 2019-02-27 DIAGNOSIS — F411 Generalized anxiety disorder: Secondary | ICD-10-CM | POA: Diagnosis not present

## 2019-02-27 DIAGNOSIS — F331 Major depressive disorder, recurrent, moderate: Secondary | ICD-10-CM | POA: Diagnosis not present

## 2019-02-27 DIAGNOSIS — F0280 Dementia in other diseases classified elsewhere without behavioral disturbance: Secondary | ICD-10-CM | POA: Diagnosis not present

## 2019-02-27 DIAGNOSIS — G301 Alzheimer's disease with late onset: Secondary | ICD-10-CM | POA: Diagnosis not present

## 2019-02-28 DIAGNOSIS — F411 Generalized anxiety disorder: Secondary | ICD-10-CM | POA: Diagnosis not present

## 2019-02-28 DIAGNOSIS — F0280 Dementia in other diseases classified elsewhere without behavioral disturbance: Secondary | ICD-10-CM | POA: Diagnosis not present

## 2019-02-28 DIAGNOSIS — G301 Alzheimer's disease with late onset: Secondary | ICD-10-CM | POA: Diagnosis not present

## 2019-02-28 DIAGNOSIS — F331 Major depressive disorder, recurrent, moderate: Secondary | ICD-10-CM | POA: Diagnosis not present

## 2019-03-08 DIAGNOSIS — G309 Alzheimer's disease, unspecified: Secondary | ICD-10-CM | POA: Diagnosis not present

## 2019-03-08 DIAGNOSIS — K219 Gastro-esophageal reflux disease without esophagitis: Secondary | ICD-10-CM | POA: Diagnosis not present

## 2019-03-08 DIAGNOSIS — F331 Major depressive disorder, recurrent, moderate: Secondary | ICD-10-CM | POA: Diagnosis not present

## 2019-03-08 DIAGNOSIS — M791 Myalgia, unspecified site: Secondary | ICD-10-CM | POA: Diagnosis not present

## 2019-03-27 DIAGNOSIS — F0280 Dementia in other diseases classified elsewhere without behavioral disturbance: Secondary | ICD-10-CM | POA: Diagnosis not present

## 2019-03-27 DIAGNOSIS — G301 Alzheimer's disease with late onset: Secondary | ICD-10-CM | POA: Diagnosis not present

## 2019-03-27 DIAGNOSIS — F331 Major depressive disorder, recurrent, moderate: Secondary | ICD-10-CM | POA: Diagnosis not present

## 2019-03-27 DIAGNOSIS — F411 Generalized anxiety disorder: Secondary | ICD-10-CM | POA: Diagnosis not present

## 2019-03-29 DIAGNOSIS — I10 Essential (primary) hypertension: Secondary | ICD-10-CM | POA: Diagnosis not present

## 2019-03-29 DIAGNOSIS — M25511 Pain in right shoulder: Secondary | ICD-10-CM | POA: Diagnosis not present

## 2019-03-29 DIAGNOSIS — M6281 Muscle weakness (generalized): Secondary | ICD-10-CM | POA: Diagnosis not present

## 2019-03-29 DIAGNOSIS — M542 Cervicalgia: Secondary | ICD-10-CM | POA: Diagnosis not present

## 2019-03-30 DIAGNOSIS — M19011 Primary osteoarthritis, right shoulder: Secondary | ICD-10-CM | POA: Diagnosis not present

## 2019-03-30 DIAGNOSIS — M19012 Primary osteoarthritis, left shoulder: Secondary | ICD-10-CM | POA: Diagnosis not present

## 2019-03-30 DIAGNOSIS — M5030 Other cervical disc degeneration, unspecified cervical region: Secondary | ICD-10-CM | POA: Diagnosis not present

## 2019-04-05 DIAGNOSIS — M542 Cervicalgia: Secondary | ICD-10-CM | POA: Diagnosis not present

## 2019-04-05 DIAGNOSIS — K219 Gastro-esophageal reflux disease without esophagitis: Secondary | ICD-10-CM | POA: Diagnosis not present

## 2019-04-05 DIAGNOSIS — G309 Alzheimer's disease, unspecified: Secondary | ICD-10-CM | POA: Diagnosis not present

## 2019-04-05 DIAGNOSIS — M25511 Pain in right shoulder: Secondary | ICD-10-CM | POA: Diagnosis not present

## 2019-04-11 DIAGNOSIS — I739 Peripheral vascular disease, unspecified: Secondary | ICD-10-CM | POA: Diagnosis not present

## 2019-04-11 DIAGNOSIS — R262 Difficulty in walking, not elsewhere classified: Secondary | ICD-10-CM | POA: Diagnosis not present

## 2019-04-11 DIAGNOSIS — M6281 Muscle weakness (generalized): Secondary | ICD-10-CM | POA: Diagnosis not present

## 2019-04-12 DIAGNOSIS — F331 Major depressive disorder, recurrent, moderate: Secondary | ICD-10-CM | POA: Diagnosis not present

## 2019-04-12 DIAGNOSIS — I739 Peripheral vascular disease, unspecified: Secondary | ICD-10-CM | POA: Diagnosis not present

## 2019-04-12 DIAGNOSIS — F0280 Dementia in other diseases classified elsewhere without behavioral disturbance: Secondary | ICD-10-CM | POA: Diagnosis not present

## 2019-04-12 DIAGNOSIS — M6281 Muscle weakness (generalized): Secondary | ICD-10-CM | POA: Diagnosis not present

## 2019-04-12 DIAGNOSIS — G301 Alzheimer's disease with late onset: Secondary | ICD-10-CM | POA: Diagnosis not present

## 2019-04-12 DIAGNOSIS — F411 Generalized anxiety disorder: Secondary | ICD-10-CM | POA: Diagnosis not present

## 2019-04-12 DIAGNOSIS — R262 Difficulty in walking, not elsewhere classified: Secondary | ICD-10-CM | POA: Diagnosis not present

## 2019-04-13 DIAGNOSIS — M6281 Muscle weakness (generalized): Secondary | ICD-10-CM | POA: Diagnosis not present

## 2019-04-13 DIAGNOSIS — R262 Difficulty in walking, not elsewhere classified: Secondary | ICD-10-CM | POA: Diagnosis not present

## 2019-04-13 DIAGNOSIS — I739 Peripheral vascular disease, unspecified: Secondary | ICD-10-CM | POA: Diagnosis not present

## 2019-04-14 DIAGNOSIS — R262 Difficulty in walking, not elsewhere classified: Secondary | ICD-10-CM | POA: Diagnosis not present

## 2019-04-14 DIAGNOSIS — I739 Peripheral vascular disease, unspecified: Secondary | ICD-10-CM | POA: Diagnosis not present

## 2019-04-14 DIAGNOSIS — M6281 Muscle weakness (generalized): Secondary | ICD-10-CM | POA: Diagnosis not present

## 2019-04-17 DIAGNOSIS — R262 Difficulty in walking, not elsewhere classified: Secondary | ICD-10-CM | POA: Diagnosis not present

## 2019-04-17 DIAGNOSIS — I739 Peripheral vascular disease, unspecified: Secondary | ICD-10-CM | POA: Diagnosis not present

## 2019-04-17 DIAGNOSIS — M6281 Muscle weakness (generalized): Secondary | ICD-10-CM | POA: Diagnosis not present

## 2019-04-18 DIAGNOSIS — I739 Peripheral vascular disease, unspecified: Secondary | ICD-10-CM | POA: Diagnosis not present

## 2019-04-18 DIAGNOSIS — M6281 Muscle weakness (generalized): Secondary | ICD-10-CM | POA: Diagnosis not present

## 2019-04-18 DIAGNOSIS — R262 Difficulty in walking, not elsewhere classified: Secondary | ICD-10-CM | POA: Diagnosis not present

## 2019-04-19 DIAGNOSIS — I739 Peripheral vascular disease, unspecified: Secondary | ICD-10-CM | POA: Diagnosis not present

## 2019-04-19 DIAGNOSIS — R262 Difficulty in walking, not elsewhere classified: Secondary | ICD-10-CM | POA: Diagnosis not present

## 2019-04-19 DIAGNOSIS — M6281 Muscle weakness (generalized): Secondary | ICD-10-CM | POA: Diagnosis not present

## 2019-04-20 DIAGNOSIS — R262 Difficulty in walking, not elsewhere classified: Secondary | ICD-10-CM | POA: Diagnosis not present

## 2019-04-20 DIAGNOSIS — I739 Peripheral vascular disease, unspecified: Secondary | ICD-10-CM | POA: Diagnosis not present

## 2019-04-20 DIAGNOSIS — M6281 Muscle weakness (generalized): Secondary | ICD-10-CM | POA: Diagnosis not present

## 2019-04-21 DIAGNOSIS — I739 Peripheral vascular disease, unspecified: Secondary | ICD-10-CM | POA: Diagnosis not present

## 2019-04-21 DIAGNOSIS — M6281 Muscle weakness (generalized): Secondary | ICD-10-CM | POA: Diagnosis not present

## 2019-04-21 DIAGNOSIS — R262 Difficulty in walking, not elsewhere classified: Secondary | ICD-10-CM | POA: Diagnosis not present

## 2019-04-24 DIAGNOSIS — M6281 Muscle weakness (generalized): Secondary | ICD-10-CM | POA: Diagnosis not present

## 2019-04-24 DIAGNOSIS — I739 Peripheral vascular disease, unspecified: Secondary | ICD-10-CM | POA: Diagnosis not present

## 2019-04-24 DIAGNOSIS — R262 Difficulty in walking, not elsewhere classified: Secondary | ICD-10-CM | POA: Diagnosis not present

## 2019-04-25 DIAGNOSIS — I739 Peripheral vascular disease, unspecified: Secondary | ICD-10-CM | POA: Diagnosis not present

## 2019-04-25 DIAGNOSIS — F331 Major depressive disorder, recurrent, moderate: Secondary | ICD-10-CM | POA: Diagnosis not present

## 2019-04-25 DIAGNOSIS — F411 Generalized anxiety disorder: Secondary | ICD-10-CM | POA: Diagnosis not present

## 2019-04-25 DIAGNOSIS — I10 Essential (primary) hypertension: Secondary | ICD-10-CM | POA: Diagnosis not present

## 2019-04-25 DIAGNOSIS — F0280 Dementia in other diseases classified elsewhere without behavioral disturbance: Secondary | ICD-10-CM | POA: Diagnosis not present

## 2019-04-25 DIAGNOSIS — M6281 Muscle weakness (generalized): Secondary | ICD-10-CM | POA: Diagnosis not present

## 2019-04-25 DIAGNOSIS — G301 Alzheimer's disease with late onset: Secondary | ICD-10-CM | POA: Diagnosis not present

## 2019-04-25 DIAGNOSIS — R262 Difficulty in walking, not elsewhere classified: Secondary | ICD-10-CM | POA: Diagnosis not present

## 2019-04-26 DIAGNOSIS — M6281 Muscle weakness (generalized): Secondary | ICD-10-CM | POA: Diagnosis not present

## 2019-04-26 DIAGNOSIS — I739 Peripheral vascular disease, unspecified: Secondary | ICD-10-CM | POA: Diagnosis not present

## 2019-04-26 DIAGNOSIS — R262 Difficulty in walking, not elsewhere classified: Secondary | ICD-10-CM | POA: Diagnosis not present

## 2019-04-27 DIAGNOSIS — G301 Alzheimer's disease with late onset: Secondary | ICD-10-CM | POA: Diagnosis not present

## 2019-04-27 DIAGNOSIS — F0280 Dementia in other diseases classified elsewhere without behavioral disturbance: Secondary | ICD-10-CM | POA: Diagnosis not present

## 2019-04-27 DIAGNOSIS — F411 Generalized anxiety disorder: Secondary | ICD-10-CM | POA: Diagnosis not present

## 2019-04-27 DIAGNOSIS — F331 Major depressive disorder, recurrent, moderate: Secondary | ICD-10-CM | POA: Diagnosis not present

## 2019-05-08 DIAGNOSIS — K219 Gastro-esophageal reflux disease without esophagitis: Secondary | ICD-10-CM | POA: Diagnosis not present

## 2019-05-08 DIAGNOSIS — I10 Essential (primary) hypertension: Secondary | ICD-10-CM | POA: Diagnosis not present

## 2019-05-08 DIAGNOSIS — M25511 Pain in right shoulder: Secondary | ICD-10-CM | POA: Diagnosis not present

## 2019-05-08 DIAGNOSIS — E559 Vitamin D deficiency, unspecified: Secondary | ICD-10-CM | POA: Diagnosis not present

## 2019-05-17 DIAGNOSIS — F0280 Dementia in other diseases classified elsewhere without behavioral disturbance: Secondary | ICD-10-CM | POA: Diagnosis not present

## 2019-05-17 DIAGNOSIS — G309 Alzheimer's disease, unspecified: Secondary | ICD-10-CM | POA: Diagnosis not present

## 2019-05-17 DIAGNOSIS — D518 Other vitamin B12 deficiency anemias: Secondary | ICD-10-CM | POA: Diagnosis not present

## 2019-05-17 DIAGNOSIS — F331 Major depressive disorder, recurrent, moderate: Secondary | ICD-10-CM | POA: Diagnosis not present

## 2019-05-22 DIAGNOSIS — G301 Alzheimer's disease with late onset: Secondary | ICD-10-CM | POA: Diagnosis not present

## 2019-05-22 DIAGNOSIS — F411 Generalized anxiety disorder: Secondary | ICD-10-CM | POA: Diagnosis not present

## 2019-05-22 DIAGNOSIS — F0280 Dementia in other diseases classified elsewhere without behavioral disturbance: Secondary | ICD-10-CM | POA: Diagnosis not present

## 2019-05-22 DIAGNOSIS — F331 Major depressive disorder, recurrent, moderate: Secondary | ICD-10-CM | POA: Diagnosis not present

## 2019-05-23 DIAGNOSIS — F331 Major depressive disorder, recurrent, moderate: Secondary | ICD-10-CM | POA: Diagnosis not present

## 2019-05-23 DIAGNOSIS — F411 Generalized anxiety disorder: Secondary | ICD-10-CM | POA: Diagnosis not present

## 2019-05-23 DIAGNOSIS — G301 Alzheimer's disease with late onset: Secondary | ICD-10-CM | POA: Diagnosis not present

## 2019-05-23 DIAGNOSIS — F0280 Dementia in other diseases classified elsewhere without behavioral disturbance: Secondary | ICD-10-CM | POA: Diagnosis not present

## 2019-05-30 DIAGNOSIS — M79672 Pain in left foot: Secondary | ICD-10-CM | POA: Diagnosis not present

## 2022-10-30 DIAGNOSIS — R278 Other lack of coordination: Secondary | ICD-10-CM | POA: Diagnosis not present

## 2022-10-30 DIAGNOSIS — M6281 Muscle weakness (generalized): Secondary | ICD-10-CM | POA: Diagnosis not present

## 2022-10-30 DIAGNOSIS — R296 Repeated falls: Secondary | ICD-10-CM | POA: Diagnosis not present

## 2022-10-30 DIAGNOSIS — G308 Other Alzheimer's disease: Secondary | ICD-10-CM | POA: Diagnosis not present

## 2022-11-02 DIAGNOSIS — G308 Other Alzheimer's disease: Secondary | ICD-10-CM | POA: Diagnosis not present

## 2022-11-02 DIAGNOSIS — R296 Repeated falls: Secondary | ICD-10-CM | POA: Diagnosis not present

## 2022-11-02 DIAGNOSIS — R278 Other lack of coordination: Secondary | ICD-10-CM | POA: Diagnosis not present

## 2022-11-02 DIAGNOSIS — M6281 Muscle weakness (generalized): Secondary | ICD-10-CM | POA: Diagnosis not present

## 2022-11-03 DIAGNOSIS — J302 Other seasonal allergic rhinitis: Secondary | ICD-10-CM | POA: Diagnosis not present

## 2022-11-03 DIAGNOSIS — J309 Allergic rhinitis, unspecified: Secondary | ICD-10-CM | POA: Diagnosis not present

## 2022-11-03 DIAGNOSIS — K219 Gastro-esophageal reflux disease without esophagitis: Secondary | ICD-10-CM | POA: Diagnosis not present

## 2022-11-03 DIAGNOSIS — E559 Vitamin D deficiency, unspecified: Secondary | ICD-10-CM | POA: Diagnosis not present

## 2022-11-03 DIAGNOSIS — I10 Essential (primary) hypertension: Secondary | ICD-10-CM | POA: Diagnosis not present

## 2022-11-03 DIAGNOSIS — G8929 Other chronic pain: Secondary | ICD-10-CM | POA: Diagnosis not present

## 2022-11-03 DIAGNOSIS — I739 Peripheral vascular disease, unspecified: Secondary | ICD-10-CM | POA: Diagnosis not present

## 2022-11-03 DIAGNOSIS — E119 Type 2 diabetes mellitus without complications: Secondary | ICD-10-CM | POA: Diagnosis not present

## 2022-11-03 DIAGNOSIS — M810 Age-related osteoporosis without current pathological fracture: Secondary | ICD-10-CM | POA: Diagnosis not present

## 2022-11-04 DIAGNOSIS — R279 Unspecified lack of coordination: Secondary | ICD-10-CM | POA: Diagnosis not present

## 2022-11-04 DIAGNOSIS — M6281 Muscle weakness (generalized): Secondary | ICD-10-CM | POA: Diagnosis not present

## 2022-11-04 DIAGNOSIS — Z9181 History of falling: Secondary | ICD-10-CM | POA: Diagnosis not present

## 2022-11-05 DIAGNOSIS — R296 Repeated falls: Secondary | ICD-10-CM | POA: Diagnosis not present

## 2022-11-05 DIAGNOSIS — G308 Other Alzheimer's disease: Secondary | ICD-10-CM | POA: Diagnosis not present

## 2022-11-05 DIAGNOSIS — R278 Other lack of coordination: Secondary | ICD-10-CM | POA: Diagnosis not present

## 2022-11-05 DIAGNOSIS — M6281 Muscle weakness (generalized): Secondary | ICD-10-CM | POA: Diagnosis not present

## 2022-11-06 DIAGNOSIS — M6281 Muscle weakness (generalized): Secondary | ICD-10-CM | POA: Diagnosis not present

## 2022-11-06 DIAGNOSIS — G308 Other Alzheimer's disease: Secondary | ICD-10-CM | POA: Diagnosis not present

## 2022-11-06 DIAGNOSIS — R278 Other lack of coordination: Secondary | ICD-10-CM | POA: Diagnosis not present

## 2022-11-06 DIAGNOSIS — Z9181 History of falling: Secondary | ICD-10-CM | POA: Diagnosis not present

## 2022-11-06 DIAGNOSIS — R296 Repeated falls: Secondary | ICD-10-CM | POA: Diagnosis not present

## 2022-11-06 DIAGNOSIS — R279 Unspecified lack of coordination: Secondary | ICD-10-CM | POA: Diagnosis not present

## 2022-11-09 DIAGNOSIS — R296 Repeated falls: Secondary | ICD-10-CM | POA: Diagnosis not present

## 2022-11-09 DIAGNOSIS — G308 Other Alzheimer's disease: Secondary | ICD-10-CM | POA: Diagnosis not present

## 2022-11-09 DIAGNOSIS — R278 Other lack of coordination: Secondary | ICD-10-CM | POA: Diagnosis not present

## 2022-11-09 DIAGNOSIS — M6281 Muscle weakness (generalized): Secondary | ICD-10-CM | POA: Diagnosis not present

## 2022-11-10 DIAGNOSIS — Z9181 History of falling: Secondary | ICD-10-CM | POA: Diagnosis not present

## 2022-11-10 DIAGNOSIS — R279 Unspecified lack of coordination: Secondary | ICD-10-CM | POA: Diagnosis not present

## 2022-11-10 DIAGNOSIS — M6281 Muscle weakness (generalized): Secondary | ICD-10-CM | POA: Diagnosis not present

## 2022-11-12 DIAGNOSIS — Z9181 History of falling: Secondary | ICD-10-CM | POA: Diagnosis not present

## 2022-11-12 DIAGNOSIS — M6281 Muscle weakness (generalized): Secondary | ICD-10-CM | POA: Diagnosis not present

## 2022-11-12 DIAGNOSIS — R279 Unspecified lack of coordination: Secondary | ICD-10-CM | POA: Diagnosis not present

## 2022-11-13 DIAGNOSIS — G308 Other Alzheimer's disease: Secondary | ICD-10-CM | POA: Diagnosis not present

## 2022-11-13 DIAGNOSIS — R278 Other lack of coordination: Secondary | ICD-10-CM | POA: Diagnosis not present

## 2022-11-13 DIAGNOSIS — R296 Repeated falls: Secondary | ICD-10-CM | POA: Diagnosis not present

## 2022-11-13 DIAGNOSIS — M6281 Muscle weakness (generalized): Secondary | ICD-10-CM | POA: Diagnosis not present

## 2022-11-16 DIAGNOSIS — M6281 Muscle weakness (generalized): Secondary | ICD-10-CM | POA: Diagnosis not present

## 2022-11-16 DIAGNOSIS — R278 Other lack of coordination: Secondary | ICD-10-CM | POA: Diagnosis not present

## 2022-11-16 DIAGNOSIS — R296 Repeated falls: Secondary | ICD-10-CM | POA: Diagnosis not present

## 2022-11-16 DIAGNOSIS — Z9181 History of falling: Secondary | ICD-10-CM | POA: Diagnosis not present

## 2022-11-16 DIAGNOSIS — R279 Unspecified lack of coordination: Secondary | ICD-10-CM | POA: Diagnosis not present

## 2022-11-16 DIAGNOSIS — G308 Other Alzheimer's disease: Secondary | ICD-10-CM | POA: Diagnosis not present

## 2022-11-18 DIAGNOSIS — Z9181 History of falling: Secondary | ICD-10-CM | POA: Diagnosis not present

## 2022-11-18 DIAGNOSIS — R278 Other lack of coordination: Secondary | ICD-10-CM | POA: Diagnosis not present

## 2022-11-18 DIAGNOSIS — R296 Repeated falls: Secondary | ICD-10-CM | POA: Diagnosis not present

## 2022-11-18 DIAGNOSIS — M6281 Muscle weakness (generalized): Secondary | ICD-10-CM | POA: Diagnosis not present

## 2022-11-18 DIAGNOSIS — R279 Unspecified lack of coordination: Secondary | ICD-10-CM | POA: Diagnosis not present

## 2022-11-18 DIAGNOSIS — G308 Other Alzheimer's disease: Secondary | ICD-10-CM | POA: Diagnosis not present

## 2022-11-20 DIAGNOSIS — R278 Other lack of coordination: Secondary | ICD-10-CM | POA: Diagnosis not present

## 2022-11-20 DIAGNOSIS — G308 Other Alzheimer's disease: Secondary | ICD-10-CM | POA: Diagnosis not present

## 2022-11-20 DIAGNOSIS — R296 Repeated falls: Secondary | ICD-10-CM | POA: Diagnosis not present

## 2022-11-20 DIAGNOSIS — F329 Major depressive disorder, single episode, unspecified: Secondary | ICD-10-CM | POA: Diagnosis not present

## 2022-11-20 DIAGNOSIS — F419 Anxiety disorder, unspecified: Secondary | ICD-10-CM | POA: Diagnosis not present

## 2022-11-20 DIAGNOSIS — M6281 Muscle weakness (generalized): Secondary | ICD-10-CM | POA: Diagnosis not present

## 2022-11-25 DIAGNOSIS — M6281 Muscle weakness (generalized): Secondary | ICD-10-CM | POA: Diagnosis not present

## 2022-11-25 DIAGNOSIS — R278 Other lack of coordination: Secondary | ICD-10-CM | POA: Diagnosis not present

## 2022-11-25 DIAGNOSIS — G309 Alzheimer's disease, unspecified: Secondary | ICD-10-CM | POA: Diagnosis not present

## 2022-11-25 DIAGNOSIS — D518 Other vitamin B12 deficiency anemias: Secondary | ICD-10-CM | POA: Diagnosis not present

## 2022-11-25 DIAGNOSIS — G308 Other Alzheimer's disease: Secondary | ICD-10-CM | POA: Diagnosis not present

## 2022-11-25 DIAGNOSIS — E038 Other specified hypothyroidism: Secondary | ICD-10-CM | POA: Diagnosis not present

## 2022-11-25 DIAGNOSIS — M810 Age-related osteoporosis without current pathological fracture: Secondary | ICD-10-CM | POA: Diagnosis not present

## 2022-11-25 DIAGNOSIS — I10 Essential (primary) hypertension: Secondary | ICD-10-CM | POA: Diagnosis not present

## 2022-11-25 DIAGNOSIS — R296 Repeated falls: Secondary | ICD-10-CM | POA: Diagnosis not present

## 2022-11-25 DIAGNOSIS — E119 Type 2 diabetes mellitus without complications: Secondary | ICD-10-CM | POA: Diagnosis not present

## 2022-11-26 DIAGNOSIS — Z79899 Other long term (current) drug therapy: Secondary | ICD-10-CM | POA: Diagnosis not present

## 2022-11-26 DIAGNOSIS — E038 Other specified hypothyroidism: Secondary | ICD-10-CM | POA: Diagnosis not present

## 2022-11-26 DIAGNOSIS — E782 Mixed hyperlipidemia: Secondary | ICD-10-CM | POA: Diagnosis not present

## 2022-11-26 DIAGNOSIS — E119 Type 2 diabetes mellitus without complications: Secondary | ICD-10-CM | POA: Diagnosis not present

## 2022-11-26 DIAGNOSIS — D518 Other vitamin B12 deficiency anemias: Secondary | ICD-10-CM | POA: Diagnosis not present

## 2022-11-27 DIAGNOSIS — M6281 Muscle weakness (generalized): Secondary | ICD-10-CM | POA: Diagnosis not present

## 2022-11-27 DIAGNOSIS — R296 Repeated falls: Secondary | ICD-10-CM | POA: Diagnosis not present

## 2022-11-27 DIAGNOSIS — R278 Other lack of coordination: Secondary | ICD-10-CM | POA: Diagnosis not present

## 2022-11-27 DIAGNOSIS — G308 Other Alzheimer's disease: Secondary | ICD-10-CM | POA: Diagnosis not present

## 2022-11-27 DIAGNOSIS — I10 Essential (primary) hypertension: Secondary | ICD-10-CM | POA: Diagnosis not present

## 2022-11-30 DIAGNOSIS — G308 Other Alzheimer's disease: Secondary | ICD-10-CM | POA: Diagnosis not present

## 2022-11-30 DIAGNOSIS — M6281 Muscle weakness (generalized): Secondary | ICD-10-CM | POA: Diagnosis not present

## 2022-11-30 DIAGNOSIS — R296 Repeated falls: Secondary | ICD-10-CM | POA: Diagnosis not present

## 2022-11-30 DIAGNOSIS — R278 Other lack of coordination: Secondary | ICD-10-CM | POA: Diagnosis not present

## 2022-12-02 DIAGNOSIS — M6281 Muscle weakness (generalized): Secondary | ICD-10-CM | POA: Diagnosis not present

## 2022-12-02 DIAGNOSIS — G308 Other Alzheimer's disease: Secondary | ICD-10-CM | POA: Diagnosis not present

## 2022-12-02 DIAGNOSIS — R278 Other lack of coordination: Secondary | ICD-10-CM | POA: Diagnosis not present

## 2022-12-02 DIAGNOSIS — R296 Repeated falls: Secondary | ICD-10-CM | POA: Diagnosis not present

## 2022-12-08 DIAGNOSIS — E119 Type 2 diabetes mellitus without complications: Secondary | ICD-10-CM | POA: Diagnosis not present

## 2022-12-08 DIAGNOSIS — J3089 Other allergic rhinitis: Secondary | ICD-10-CM | POA: Diagnosis not present

## 2022-12-08 DIAGNOSIS — B379 Candidiasis, unspecified: Secondary | ICD-10-CM | POA: Diagnosis not present

## 2022-12-08 DIAGNOSIS — R296 Repeated falls: Secondary | ICD-10-CM | POA: Diagnosis not present

## 2022-12-08 DIAGNOSIS — M810 Age-related osteoporosis without current pathological fracture: Secondary | ICD-10-CM | POA: Diagnosis not present

## 2022-12-08 DIAGNOSIS — M6281 Muscle weakness (generalized): Secondary | ICD-10-CM | POA: Diagnosis not present

## 2022-12-08 DIAGNOSIS — R278 Other lack of coordination: Secondary | ICD-10-CM | POA: Diagnosis not present

## 2022-12-08 DIAGNOSIS — J302 Other seasonal allergic rhinitis: Secondary | ICD-10-CM | POA: Diagnosis not present

## 2022-12-08 DIAGNOSIS — K219 Gastro-esophageal reflux disease without esophagitis: Secondary | ICD-10-CM | POA: Diagnosis not present

## 2022-12-08 DIAGNOSIS — E559 Vitamin D deficiency, unspecified: Secondary | ICD-10-CM | POA: Diagnosis not present

## 2022-12-08 DIAGNOSIS — G308 Other Alzheimer's disease: Secondary | ICD-10-CM | POA: Diagnosis not present

## 2022-12-08 DIAGNOSIS — I10 Essential (primary) hypertension: Secondary | ICD-10-CM | POA: Diagnosis not present

## 2022-12-15 DIAGNOSIS — R296 Repeated falls: Secondary | ICD-10-CM | POA: Diagnosis not present

## 2022-12-15 DIAGNOSIS — G308 Other Alzheimer's disease: Secondary | ICD-10-CM | POA: Diagnosis not present

## 2022-12-15 DIAGNOSIS — M6281 Muscle weakness (generalized): Secondary | ICD-10-CM | POA: Diagnosis not present

## 2022-12-15 DIAGNOSIS — R278 Other lack of coordination: Secondary | ICD-10-CM | POA: Diagnosis not present

## 2022-12-19 DIAGNOSIS — R278 Other lack of coordination: Secondary | ICD-10-CM | POA: Diagnosis not present

## 2022-12-19 DIAGNOSIS — R296 Repeated falls: Secondary | ICD-10-CM | POA: Diagnosis not present

## 2022-12-19 DIAGNOSIS — M6281 Muscle weakness (generalized): Secondary | ICD-10-CM | POA: Diagnosis not present

## 2022-12-19 DIAGNOSIS — G308 Other Alzheimer's disease: Secondary | ICD-10-CM | POA: Diagnosis not present

## 2022-12-21 DIAGNOSIS — G308 Other Alzheimer's disease: Secondary | ICD-10-CM | POA: Diagnosis not present

## 2022-12-21 DIAGNOSIS — M6281 Muscle weakness (generalized): Secondary | ICD-10-CM | POA: Diagnosis not present

## 2022-12-21 DIAGNOSIS — R278 Other lack of coordination: Secondary | ICD-10-CM | POA: Diagnosis not present

## 2022-12-21 DIAGNOSIS — R296 Repeated falls: Secondary | ICD-10-CM | POA: Diagnosis not present

## 2022-12-23 DIAGNOSIS — G308 Other Alzheimer's disease: Secondary | ICD-10-CM | POA: Diagnosis not present

## 2022-12-23 DIAGNOSIS — R296 Repeated falls: Secondary | ICD-10-CM | POA: Diagnosis not present

## 2022-12-23 DIAGNOSIS — R278 Other lack of coordination: Secondary | ICD-10-CM | POA: Diagnosis not present

## 2022-12-23 DIAGNOSIS — M6281 Muscle weakness (generalized): Secondary | ICD-10-CM | POA: Diagnosis not present

## 2022-12-28 DIAGNOSIS — M1991 Primary osteoarthritis, unspecified site: Secondary | ICD-10-CM | POA: Diagnosis not present

## 2022-12-28 DIAGNOSIS — G308 Other Alzheimer's disease: Secondary | ICD-10-CM | POA: Diagnosis not present

## 2022-12-28 DIAGNOSIS — E119 Type 2 diabetes mellitus without complications: Secondary | ICD-10-CM | POA: Diagnosis not present

## 2022-12-28 DIAGNOSIS — R278 Other lack of coordination: Secondary | ICD-10-CM | POA: Diagnosis not present

## 2022-12-28 DIAGNOSIS — M810 Age-related osteoporosis without current pathological fracture: Secondary | ICD-10-CM | POA: Diagnosis not present

## 2022-12-28 DIAGNOSIS — M6281 Muscle weakness (generalized): Secondary | ICD-10-CM | POA: Diagnosis not present

## 2022-12-28 DIAGNOSIS — R296 Repeated falls: Secondary | ICD-10-CM | POA: Diagnosis not present

## 2022-12-28 DIAGNOSIS — E038 Other specified hypothyroidism: Secondary | ICD-10-CM | POA: Diagnosis not present

## 2022-12-28 DIAGNOSIS — D518 Other vitamin B12 deficiency anemias: Secondary | ICD-10-CM | POA: Diagnosis not present

## 2022-12-28 DIAGNOSIS — G309 Alzheimer's disease, unspecified: Secondary | ICD-10-CM | POA: Diagnosis not present

## 2022-12-28 DIAGNOSIS — I10 Essential (primary) hypertension: Secondary | ICD-10-CM | POA: Diagnosis not present

## 2022-12-28 DIAGNOSIS — E782 Mixed hyperlipidemia: Secondary | ICD-10-CM | POA: Diagnosis not present

## 2022-12-29 DIAGNOSIS — I10 Essential (primary) hypertension: Secondary | ICD-10-CM | POA: Diagnosis not present

## 2023-01-01 DIAGNOSIS — R296 Repeated falls: Secondary | ICD-10-CM | POA: Diagnosis not present

## 2023-01-01 DIAGNOSIS — R278 Other lack of coordination: Secondary | ICD-10-CM | POA: Diagnosis not present

## 2023-01-01 DIAGNOSIS — G308 Other Alzheimer's disease: Secondary | ICD-10-CM | POA: Diagnosis not present

## 2023-01-01 DIAGNOSIS — M6281 Muscle weakness (generalized): Secondary | ICD-10-CM | POA: Diagnosis not present

## 2023-01-04 DIAGNOSIS — G308 Other Alzheimer's disease: Secondary | ICD-10-CM | POA: Diagnosis not present

## 2023-01-04 DIAGNOSIS — R278 Other lack of coordination: Secondary | ICD-10-CM | POA: Diagnosis not present

## 2023-01-04 DIAGNOSIS — M6281 Muscle weakness (generalized): Secondary | ICD-10-CM | POA: Diagnosis not present

## 2023-01-04 DIAGNOSIS — R296 Repeated falls: Secondary | ICD-10-CM | POA: Diagnosis not present

## 2023-01-08 DIAGNOSIS — G308 Other Alzheimer's disease: Secondary | ICD-10-CM | POA: Diagnosis not present

## 2023-01-08 DIAGNOSIS — R296 Repeated falls: Secondary | ICD-10-CM | POA: Diagnosis not present

## 2023-01-08 DIAGNOSIS — R278 Other lack of coordination: Secondary | ICD-10-CM | POA: Diagnosis not present

## 2023-01-08 DIAGNOSIS — M6281 Muscle weakness (generalized): Secondary | ICD-10-CM | POA: Diagnosis not present

## 2023-01-12 DIAGNOSIS — J3089 Other allergic rhinitis: Secondary | ICD-10-CM | POA: Diagnosis not present

## 2023-01-12 DIAGNOSIS — M810 Age-related osteoporosis without current pathological fracture: Secondary | ICD-10-CM | POA: Diagnosis not present

## 2023-01-12 DIAGNOSIS — E559 Vitamin D deficiency, unspecified: Secondary | ICD-10-CM | POA: Diagnosis not present

## 2023-01-12 DIAGNOSIS — G8929 Other chronic pain: Secondary | ICD-10-CM | POA: Diagnosis not present

## 2023-01-12 DIAGNOSIS — K219 Gastro-esophageal reflux disease without esophagitis: Secondary | ICD-10-CM | POA: Diagnosis not present

## 2023-01-12 DIAGNOSIS — I10 Essential (primary) hypertension: Secondary | ICD-10-CM | POA: Diagnosis not present

## 2023-01-12 DIAGNOSIS — E119 Type 2 diabetes mellitus without complications: Secondary | ICD-10-CM | POA: Diagnosis not present

## 2023-01-12 DIAGNOSIS — J302 Other seasonal allergic rhinitis: Secondary | ICD-10-CM | POA: Diagnosis not present

## 2023-01-12 DIAGNOSIS — I739 Peripheral vascular disease, unspecified: Secondary | ICD-10-CM | POA: Diagnosis not present

## 2023-01-13 DIAGNOSIS — M6281 Muscle weakness (generalized): Secondary | ICD-10-CM | POA: Diagnosis not present

## 2023-01-13 DIAGNOSIS — R296 Repeated falls: Secondary | ICD-10-CM | POA: Diagnosis not present

## 2023-01-13 DIAGNOSIS — R278 Other lack of coordination: Secondary | ICD-10-CM | POA: Diagnosis not present

## 2023-01-13 DIAGNOSIS — G308 Other Alzheimer's disease: Secondary | ICD-10-CM | POA: Diagnosis not present

## 2023-01-14 DIAGNOSIS — E038 Other specified hypothyroidism: Secondary | ICD-10-CM | POA: Diagnosis not present

## 2023-01-14 DIAGNOSIS — D518 Other vitamin B12 deficiency anemias: Secondary | ICD-10-CM | POA: Diagnosis not present

## 2023-01-14 DIAGNOSIS — E119 Type 2 diabetes mellitus without complications: Secondary | ICD-10-CM | POA: Diagnosis not present

## 2023-01-14 DIAGNOSIS — G309 Alzheimer's disease, unspecified: Secondary | ICD-10-CM | POA: Diagnosis not present

## 2023-01-14 DIAGNOSIS — E782 Mixed hyperlipidemia: Secondary | ICD-10-CM | POA: Diagnosis not present

## 2023-01-14 DIAGNOSIS — M810 Age-related osteoporosis without current pathological fracture: Secondary | ICD-10-CM | POA: Diagnosis not present

## 2023-01-14 DIAGNOSIS — I10 Essential (primary) hypertension: Secondary | ICD-10-CM | POA: Diagnosis not present

## 2023-01-18 DIAGNOSIS — M6281 Muscle weakness (generalized): Secondary | ICD-10-CM | POA: Diagnosis not present

## 2023-01-18 DIAGNOSIS — G308 Other Alzheimer's disease: Secondary | ICD-10-CM | POA: Diagnosis not present

## 2023-01-18 DIAGNOSIS — R278 Other lack of coordination: Secondary | ICD-10-CM | POA: Diagnosis not present

## 2023-01-18 DIAGNOSIS — R296 Repeated falls: Secondary | ICD-10-CM | POA: Diagnosis not present

## 2023-01-20 DIAGNOSIS — R278 Other lack of coordination: Secondary | ICD-10-CM | POA: Diagnosis not present

## 2023-01-20 DIAGNOSIS — R296 Repeated falls: Secondary | ICD-10-CM | POA: Diagnosis not present

## 2023-01-20 DIAGNOSIS — G308 Other Alzheimer's disease: Secondary | ICD-10-CM | POA: Diagnosis not present

## 2023-01-20 DIAGNOSIS — M6281 Muscle weakness (generalized): Secondary | ICD-10-CM | POA: Diagnosis not present

## 2023-01-26 DIAGNOSIS — R159 Full incontinence of feces: Secondary | ICD-10-CM | POA: Diagnosis not present

## 2023-01-26 DIAGNOSIS — R32 Unspecified urinary incontinence: Secondary | ICD-10-CM | POA: Diagnosis not present

## 2023-01-27 DIAGNOSIS — E1140 Type 2 diabetes mellitus with diabetic neuropathy, unspecified: Secondary | ICD-10-CM | POA: Diagnosis not present

## 2023-01-27 DIAGNOSIS — R296 Repeated falls: Secondary | ICD-10-CM | POA: Diagnosis not present

## 2023-01-27 DIAGNOSIS — M79671 Pain in right foot: Secondary | ICD-10-CM | POA: Diagnosis not present

## 2023-01-27 DIAGNOSIS — L600 Ingrowing nail: Secondary | ICD-10-CM | POA: Diagnosis not present

## 2023-01-27 DIAGNOSIS — B351 Tinea unguium: Secondary | ICD-10-CM | POA: Diagnosis not present

## 2023-01-27 DIAGNOSIS — R278 Other lack of coordination: Secondary | ICD-10-CM | POA: Diagnosis not present

## 2023-01-27 DIAGNOSIS — M6281 Muscle weakness (generalized): Secondary | ICD-10-CM | POA: Diagnosis not present

## 2023-01-27 DIAGNOSIS — G308 Other Alzheimer's disease: Secondary | ICD-10-CM | POA: Diagnosis not present

## 2023-01-27 DIAGNOSIS — M79672 Pain in left foot: Secondary | ICD-10-CM | POA: Diagnosis not present

## 2023-01-28 DIAGNOSIS — I10 Essential (primary) hypertension: Secondary | ICD-10-CM | POA: Diagnosis not present

## 2023-02-01 DIAGNOSIS — R278 Other lack of coordination: Secondary | ICD-10-CM | POA: Diagnosis not present

## 2023-02-01 DIAGNOSIS — R296 Repeated falls: Secondary | ICD-10-CM | POA: Diagnosis not present

## 2023-02-01 DIAGNOSIS — G308 Other Alzheimer's disease: Secondary | ICD-10-CM | POA: Diagnosis not present

## 2023-02-01 DIAGNOSIS — M6281 Muscle weakness (generalized): Secondary | ICD-10-CM | POA: Diagnosis not present

## 2023-02-03 DIAGNOSIS — R278 Other lack of coordination: Secondary | ICD-10-CM | POA: Diagnosis not present

## 2023-02-03 DIAGNOSIS — M6281 Muscle weakness (generalized): Secondary | ICD-10-CM | POA: Diagnosis not present

## 2023-02-03 DIAGNOSIS — G308 Other Alzheimer's disease: Secondary | ICD-10-CM | POA: Diagnosis not present

## 2023-02-03 DIAGNOSIS — R296 Repeated falls: Secondary | ICD-10-CM | POA: Diagnosis not present

## 2023-02-11 DIAGNOSIS — D518 Other vitamin B12 deficiency anemias: Secondary | ICD-10-CM | POA: Diagnosis not present

## 2023-02-11 DIAGNOSIS — E782 Mixed hyperlipidemia: Secondary | ICD-10-CM | POA: Diagnosis not present

## 2023-02-11 DIAGNOSIS — R296 Repeated falls: Secondary | ICD-10-CM | POA: Diagnosis not present

## 2023-02-11 DIAGNOSIS — E119 Type 2 diabetes mellitus without complications: Secondary | ICD-10-CM | POA: Diagnosis not present

## 2023-02-11 DIAGNOSIS — Z79899 Other long term (current) drug therapy: Secondary | ICD-10-CM | POA: Diagnosis not present

## 2023-02-12 DIAGNOSIS — R296 Repeated falls: Secondary | ICD-10-CM | POA: Diagnosis not present

## 2023-02-12 DIAGNOSIS — G308 Other Alzheimer's disease: Secondary | ICD-10-CM | POA: Diagnosis not present

## 2023-02-12 DIAGNOSIS — R278 Other lack of coordination: Secondary | ICD-10-CM | POA: Diagnosis not present

## 2023-02-12 DIAGNOSIS — M6281 Muscle weakness (generalized): Secondary | ICD-10-CM | POA: Diagnosis not present

## 2023-02-16 DIAGNOSIS — D518 Other vitamin B12 deficiency anemias: Secondary | ICD-10-CM | POA: Diagnosis not present

## 2023-02-16 DIAGNOSIS — I10 Essential (primary) hypertension: Secondary | ICD-10-CM | POA: Diagnosis not present

## 2023-02-16 DIAGNOSIS — G8929 Other chronic pain: Secondary | ICD-10-CM | POA: Diagnosis not present

## 2023-02-16 DIAGNOSIS — M810 Age-related osteoporosis without current pathological fracture: Secondary | ICD-10-CM | POA: Diagnosis not present

## 2023-02-16 DIAGNOSIS — I739 Peripheral vascular disease, unspecified: Secondary | ICD-10-CM | POA: Diagnosis not present

## 2023-02-16 DIAGNOSIS — E119 Type 2 diabetes mellitus without complications: Secondary | ICD-10-CM | POA: Diagnosis not present

## 2023-02-16 DIAGNOSIS — F329 Major depressive disorder, single episode, unspecified: Secondary | ICD-10-CM | POA: Diagnosis not present

## 2023-02-16 DIAGNOSIS — K219 Gastro-esophageal reflux disease without esophagitis: Secondary | ICD-10-CM | POA: Diagnosis not present

## 2023-02-16 DIAGNOSIS — K588 Other irritable bowel syndrome: Secondary | ICD-10-CM | POA: Diagnosis not present

## 2023-02-16 DIAGNOSIS — G309 Alzheimer's disease, unspecified: Secondary | ICD-10-CM | POA: Diagnosis not present

## 2023-02-16 DIAGNOSIS — J3089 Other allergic rhinitis: Secondary | ICD-10-CM | POA: Diagnosis not present

## 2023-02-16 DIAGNOSIS — E559 Vitamin D deficiency, unspecified: Secondary | ICD-10-CM | POA: Diagnosis not present

## 2023-02-16 DIAGNOSIS — M1991 Primary osteoarthritis, unspecified site: Secondary | ICD-10-CM | POA: Diagnosis not present

## 2023-02-16 DIAGNOSIS — E782 Mixed hyperlipidemia: Secondary | ICD-10-CM | POA: Diagnosis not present

## 2023-02-16 DIAGNOSIS — F331 Major depressive disorder, recurrent, moderate: Secondary | ICD-10-CM | POA: Diagnosis not present

## 2023-02-17 DIAGNOSIS — G308 Other Alzheimer's disease: Secondary | ICD-10-CM | POA: Diagnosis not present

## 2023-02-17 DIAGNOSIS — M6281 Muscle weakness (generalized): Secondary | ICD-10-CM | POA: Diagnosis not present

## 2023-02-17 DIAGNOSIS — R278 Other lack of coordination: Secondary | ICD-10-CM | POA: Diagnosis not present

## 2023-02-17 DIAGNOSIS — R296 Repeated falls: Secondary | ICD-10-CM | POA: Diagnosis not present

## 2023-02-17 DIAGNOSIS — N760 Acute vaginitis: Secondary | ICD-10-CM | POA: Diagnosis not present

## 2023-02-26 DIAGNOSIS — G308 Other Alzheimer's disease: Secondary | ICD-10-CM | POA: Diagnosis not present

## 2023-02-26 DIAGNOSIS — M6281 Muscle weakness (generalized): Secondary | ICD-10-CM | POA: Diagnosis not present

## 2023-02-26 DIAGNOSIS — R296 Repeated falls: Secondary | ICD-10-CM | POA: Diagnosis not present

## 2023-02-26 DIAGNOSIS — R278 Other lack of coordination: Secondary | ICD-10-CM | POA: Diagnosis not present

## 2023-03-01 DIAGNOSIS — R278 Other lack of coordination: Secondary | ICD-10-CM | POA: Diagnosis not present

## 2023-03-01 DIAGNOSIS — M6281 Muscle weakness (generalized): Secondary | ICD-10-CM | POA: Diagnosis not present

## 2023-03-01 DIAGNOSIS — R296 Repeated falls: Secondary | ICD-10-CM | POA: Diagnosis not present

## 2023-03-01 DIAGNOSIS — G308 Other Alzheimer's disease: Secondary | ICD-10-CM | POA: Diagnosis not present

## 2023-03-05 DIAGNOSIS — G308 Other Alzheimer's disease: Secondary | ICD-10-CM | POA: Diagnosis not present

## 2023-03-05 DIAGNOSIS — R278 Other lack of coordination: Secondary | ICD-10-CM | POA: Diagnosis not present

## 2023-03-05 DIAGNOSIS — M6281 Muscle weakness (generalized): Secondary | ICD-10-CM | POA: Diagnosis not present

## 2023-03-05 DIAGNOSIS — R296 Repeated falls: Secondary | ICD-10-CM | POA: Diagnosis not present

## 2023-03-08 DIAGNOSIS — G308 Other Alzheimer's disease: Secondary | ICD-10-CM | POA: Diagnosis not present

## 2023-03-08 DIAGNOSIS — R278 Other lack of coordination: Secondary | ICD-10-CM | POA: Diagnosis not present

## 2023-03-08 DIAGNOSIS — M6281 Muscle weakness (generalized): Secondary | ICD-10-CM | POA: Diagnosis not present

## 2023-03-08 DIAGNOSIS — R296 Repeated falls: Secondary | ICD-10-CM | POA: Diagnosis not present

## 2023-03-10 DIAGNOSIS — G308 Other Alzheimer's disease: Secondary | ICD-10-CM | POA: Diagnosis not present

## 2023-03-10 DIAGNOSIS — M6281 Muscle weakness (generalized): Secondary | ICD-10-CM | POA: Diagnosis not present

## 2023-03-10 DIAGNOSIS — R278 Other lack of coordination: Secondary | ICD-10-CM | POA: Diagnosis not present

## 2023-03-10 DIAGNOSIS — R296 Repeated falls: Secondary | ICD-10-CM | POA: Diagnosis not present

## 2023-03-11 DIAGNOSIS — R296 Repeated falls: Secondary | ICD-10-CM | POA: Diagnosis not present

## 2023-03-15 DIAGNOSIS — R278 Other lack of coordination: Secondary | ICD-10-CM | POA: Diagnosis not present

## 2023-03-15 DIAGNOSIS — E119 Type 2 diabetes mellitus without complications: Secondary | ICD-10-CM | POA: Diagnosis not present

## 2023-03-15 DIAGNOSIS — M810 Age-related osteoporosis without current pathological fracture: Secondary | ICD-10-CM | POA: Diagnosis not present

## 2023-03-15 DIAGNOSIS — G309 Alzheimer's disease, unspecified: Secondary | ICD-10-CM | POA: Diagnosis not present

## 2023-03-15 DIAGNOSIS — M1991 Primary osteoarthritis, unspecified site: Secondary | ICD-10-CM | POA: Diagnosis not present

## 2023-03-15 DIAGNOSIS — M6281 Muscle weakness (generalized): Secondary | ICD-10-CM | POA: Diagnosis not present

## 2023-03-15 DIAGNOSIS — D518 Other vitamin B12 deficiency anemias: Secondary | ICD-10-CM | POA: Diagnosis not present

## 2023-03-15 DIAGNOSIS — G308 Other Alzheimer's disease: Secondary | ICD-10-CM | POA: Diagnosis not present

## 2023-03-15 DIAGNOSIS — R296 Repeated falls: Secondary | ICD-10-CM | POA: Diagnosis not present

## 2023-03-15 DIAGNOSIS — F329 Major depressive disorder, single episode, unspecified: Secondary | ICD-10-CM | POA: Diagnosis not present

## 2023-03-15 DIAGNOSIS — F331 Major depressive disorder, recurrent, moderate: Secondary | ICD-10-CM | POA: Diagnosis not present

## 2023-03-15 DIAGNOSIS — I10 Essential (primary) hypertension: Secondary | ICD-10-CM | POA: Diagnosis not present

## 2023-03-16 DIAGNOSIS — M810 Age-related osteoporosis without current pathological fracture: Secondary | ICD-10-CM | POA: Diagnosis not present

## 2023-03-16 DIAGNOSIS — E559 Vitamin D deficiency, unspecified: Secondary | ICD-10-CM | POA: Diagnosis not present

## 2023-03-16 DIAGNOSIS — G8929 Other chronic pain: Secondary | ICD-10-CM | POA: Diagnosis not present

## 2023-03-16 DIAGNOSIS — I10 Essential (primary) hypertension: Secondary | ICD-10-CM | POA: Diagnosis not present

## 2023-03-16 DIAGNOSIS — E1140 Type 2 diabetes mellitus with diabetic neuropathy, unspecified: Secondary | ICD-10-CM | POA: Diagnosis not present

## 2023-03-16 DIAGNOSIS — J3089 Other allergic rhinitis: Secondary | ICD-10-CM | POA: Diagnosis not present

## 2023-03-16 DIAGNOSIS — K219 Gastro-esophageal reflux disease without esophagitis: Secondary | ICD-10-CM | POA: Diagnosis not present

## 2023-03-16 DIAGNOSIS — I739 Peripheral vascular disease, unspecified: Secondary | ICD-10-CM | POA: Diagnosis not present

## 2023-03-23 DIAGNOSIS — M6281 Muscle weakness (generalized): Secondary | ICD-10-CM | POA: Diagnosis not present

## 2023-03-23 DIAGNOSIS — R296 Repeated falls: Secondary | ICD-10-CM | POA: Diagnosis not present

## 2023-03-23 DIAGNOSIS — G308 Other Alzheimer's disease: Secondary | ICD-10-CM | POA: Diagnosis not present

## 2023-03-23 DIAGNOSIS — R278 Other lack of coordination: Secondary | ICD-10-CM | POA: Diagnosis not present

## 2023-03-25 DIAGNOSIS — R296 Repeated falls: Secondary | ICD-10-CM | POA: Diagnosis not present

## 2023-03-25 DIAGNOSIS — G308 Other Alzheimer's disease: Secondary | ICD-10-CM | POA: Diagnosis not present

## 2023-03-25 DIAGNOSIS — M6281 Muscle weakness (generalized): Secondary | ICD-10-CM | POA: Diagnosis not present

## 2023-03-25 DIAGNOSIS — R278 Other lack of coordination: Secondary | ICD-10-CM | POA: Diagnosis not present

## 2023-03-29 DIAGNOSIS — I10 Essential (primary) hypertension: Secondary | ICD-10-CM | POA: Diagnosis not present

## 2023-04-12 DIAGNOSIS — D518 Other vitamin B12 deficiency anemias: Secondary | ICD-10-CM | POA: Diagnosis not present

## 2023-04-12 DIAGNOSIS — E119 Type 2 diabetes mellitus without complications: Secondary | ICD-10-CM | POA: Diagnosis not present

## 2023-04-12 DIAGNOSIS — G309 Alzheimer's disease, unspecified: Secondary | ICD-10-CM | POA: Diagnosis not present

## 2023-04-12 DIAGNOSIS — I10 Essential (primary) hypertension: Secondary | ICD-10-CM | POA: Diagnosis not present

## 2023-04-12 DIAGNOSIS — E038 Other specified hypothyroidism: Secondary | ICD-10-CM | POA: Diagnosis not present

## 2023-04-12 DIAGNOSIS — E782 Mixed hyperlipidemia: Secondary | ICD-10-CM | POA: Diagnosis not present

## 2023-04-12 DIAGNOSIS — M810 Age-related osteoporosis without current pathological fracture: Secondary | ICD-10-CM | POA: Diagnosis not present

## 2023-04-12 DIAGNOSIS — M1991 Primary osteoarthritis, unspecified site: Secondary | ICD-10-CM | POA: Diagnosis not present

## 2023-04-13 DIAGNOSIS — G309 Alzheimer's disease, unspecified: Secondary | ICD-10-CM | POA: Diagnosis not present

## 2023-04-13 DIAGNOSIS — J3089 Other allergic rhinitis: Secondary | ICD-10-CM | POA: Diagnosis not present

## 2023-04-13 DIAGNOSIS — I739 Peripheral vascular disease, unspecified: Secondary | ICD-10-CM | POA: Diagnosis not present

## 2023-04-13 DIAGNOSIS — K219 Gastro-esophageal reflux disease without esophagitis: Secondary | ICD-10-CM | POA: Diagnosis not present

## 2023-04-13 DIAGNOSIS — M810 Age-related osteoporosis without current pathological fracture: Secondary | ICD-10-CM | POA: Diagnosis not present

## 2023-04-13 DIAGNOSIS — I10 Essential (primary) hypertension: Secondary | ICD-10-CM | POA: Diagnosis not present

## 2023-04-13 DIAGNOSIS — E559 Vitamin D deficiency, unspecified: Secondary | ICD-10-CM | POA: Diagnosis not present

## 2023-04-13 DIAGNOSIS — E119 Type 2 diabetes mellitus without complications: Secondary | ICD-10-CM | POA: Diagnosis not present

## 2023-04-13 DIAGNOSIS — G8929 Other chronic pain: Secondary | ICD-10-CM | POA: Diagnosis not present

## 2023-04-16 DIAGNOSIS — R011 Cardiac murmur, unspecified: Secondary | ICD-10-CM | POA: Diagnosis not present

## 2023-04-28 DIAGNOSIS — I10 Essential (primary) hypertension: Secondary | ICD-10-CM | POA: Diagnosis not present

## 2023-05-11 DIAGNOSIS — G309 Alzheimer's disease, unspecified: Secondary | ICD-10-CM | POA: Diagnosis not present

## 2023-05-11 DIAGNOSIS — K219 Gastro-esophageal reflux disease without esophagitis: Secondary | ICD-10-CM | POA: Diagnosis not present

## 2023-05-11 DIAGNOSIS — F419 Anxiety disorder, unspecified: Secondary | ICD-10-CM | POA: Diagnosis not present

## 2023-05-11 DIAGNOSIS — E559 Vitamin D deficiency, unspecified: Secondary | ICD-10-CM | POA: Diagnosis not present

## 2023-05-11 DIAGNOSIS — I739 Peripheral vascular disease, unspecified: Secondary | ICD-10-CM | POA: Diagnosis not present

## 2023-05-11 DIAGNOSIS — E119 Type 2 diabetes mellitus without complications: Secondary | ICD-10-CM | POA: Diagnosis not present

## 2023-05-11 DIAGNOSIS — F329 Major depressive disorder, single episode, unspecified: Secondary | ICD-10-CM | POA: Diagnosis not present

## 2023-05-11 DIAGNOSIS — I10 Essential (primary) hypertension: Secondary | ICD-10-CM | POA: Diagnosis not present

## 2023-05-11 DIAGNOSIS — J3089 Other allergic rhinitis: Secondary | ICD-10-CM | POA: Diagnosis not present

## 2023-05-13 DIAGNOSIS — E038 Other specified hypothyroidism: Secondary | ICD-10-CM | POA: Diagnosis not present

## 2023-05-13 DIAGNOSIS — E119 Type 2 diabetes mellitus without complications: Secondary | ICD-10-CM | POA: Diagnosis not present

## 2023-05-13 DIAGNOSIS — Z79899 Other long term (current) drug therapy: Secondary | ICD-10-CM | POA: Diagnosis not present

## 2023-05-13 DIAGNOSIS — E782 Mixed hyperlipidemia: Secondary | ICD-10-CM | POA: Diagnosis not present

## 2023-05-13 DIAGNOSIS — D518 Other vitamin B12 deficiency anemias: Secondary | ICD-10-CM | POA: Diagnosis not present

## 2023-05-17 DIAGNOSIS — G309 Alzheimer's disease, unspecified: Secondary | ICD-10-CM | POA: Diagnosis not present

## 2023-05-17 DIAGNOSIS — F331 Major depressive disorder, recurrent, moderate: Secondary | ICD-10-CM | POA: Diagnosis not present

## 2023-05-17 DIAGNOSIS — M1991 Primary osteoarthritis, unspecified site: Secondary | ICD-10-CM | POA: Diagnosis not present

## 2023-05-17 DIAGNOSIS — E038 Other specified hypothyroidism: Secondary | ICD-10-CM | POA: Diagnosis not present

## 2023-05-17 DIAGNOSIS — E119 Type 2 diabetes mellitus without complications: Secondary | ICD-10-CM | POA: Diagnosis not present

## 2023-05-17 DIAGNOSIS — M810 Age-related osteoporosis without current pathological fracture: Secondary | ICD-10-CM | POA: Diagnosis not present

## 2023-05-17 DIAGNOSIS — I10 Essential (primary) hypertension: Secondary | ICD-10-CM | POA: Diagnosis not present

## 2023-05-17 DIAGNOSIS — D518 Other vitamin B12 deficiency anemias: Secondary | ICD-10-CM | POA: Diagnosis not present

## 2023-05-24 DIAGNOSIS — R159 Full incontinence of feces: Secondary | ICD-10-CM | POA: Diagnosis not present

## 2023-05-24 DIAGNOSIS — M79672 Pain in left foot: Secondary | ICD-10-CM | POA: Diagnosis not present

## 2023-05-24 DIAGNOSIS — R32 Unspecified urinary incontinence: Secondary | ICD-10-CM | POA: Diagnosis not present

## 2023-05-24 DIAGNOSIS — M79671 Pain in right foot: Secondary | ICD-10-CM | POA: Diagnosis not present

## 2023-05-24 DIAGNOSIS — E1140 Type 2 diabetes mellitus with diabetic neuropathy, unspecified: Secondary | ICD-10-CM | POA: Diagnosis not present

## 2023-05-26 ENCOUNTER — Other Ambulatory Visit: Payer: Self-pay

## 2023-05-26 ENCOUNTER — Emergency Department (HOSPITAL_COMMUNITY): Payer: Medicare HMO

## 2023-05-26 ENCOUNTER — Encounter (HOSPITAL_COMMUNITY): Payer: Self-pay | Admitting: Emergency Medicine

## 2023-05-26 ENCOUNTER — Emergency Department (HOSPITAL_COMMUNITY)
Admission: EM | Admit: 2023-05-26 | Discharge: 2023-05-26 | Disposition: A | Discharge status: Home / Self Care | Payer: Medicare HMO | Attending: Emergency Medicine | Admitting: Emergency Medicine

## 2023-05-26 DIAGNOSIS — F0390 Unspecified dementia without behavioral disturbance: Secondary | ICD-10-CM | POA: Diagnosis not present

## 2023-05-26 DIAGNOSIS — R262 Difficulty in walking, not elsewhere classified: Secondary | ICD-10-CM | POA: Diagnosis not present

## 2023-05-26 DIAGNOSIS — I10 Essential (primary) hypertension: Secondary | ICD-10-CM | POA: Diagnosis not present

## 2023-05-26 DIAGNOSIS — M8588 Other specified disorders of bone density and structure, other site: Secondary | ICD-10-CM | POA: Diagnosis not present

## 2023-05-26 DIAGNOSIS — Z043 Encounter for examination and observation following other accident: Secondary | ICD-10-CM | POA: Diagnosis not present

## 2023-05-26 DIAGNOSIS — M4802 Spinal stenosis, cervical region: Secondary | ICD-10-CM | POA: Diagnosis not present

## 2023-05-26 DIAGNOSIS — S0990XA Unspecified injury of head, initial encounter: Secondary | ICD-10-CM | POA: Diagnosis not present

## 2023-05-26 DIAGNOSIS — I959 Hypotension, unspecified: Secondary | ICD-10-CM | POA: Diagnosis not present

## 2023-05-26 DIAGNOSIS — R102 Pelvic and perineal pain: Secondary | ICD-10-CM | POA: Diagnosis not present

## 2023-05-26 DIAGNOSIS — M50322 Other cervical disc degeneration at C5-C6 level: Secondary | ICD-10-CM | POA: Diagnosis not present

## 2023-05-26 DIAGNOSIS — M25572 Pain in left ankle and joints of left foot: Secondary | ICD-10-CM | POA: Diagnosis not present

## 2023-05-26 DIAGNOSIS — I700 Atherosclerosis of aorta: Secondary | ICD-10-CM | POA: Diagnosis not present

## 2023-05-26 DIAGNOSIS — R269 Unspecified abnormalities of gait and mobility: Secondary | ICD-10-CM | POA: Diagnosis not present

## 2023-05-26 DIAGNOSIS — M47812 Spondylosis without myelopathy or radiculopathy, cervical region: Secondary | ICD-10-CM | POA: Diagnosis not present

## 2023-05-26 DIAGNOSIS — W19XXXA Unspecified fall, initial encounter: Secondary | ICD-10-CM | POA: Insufficient documentation

## 2023-05-26 DIAGNOSIS — M25551 Pain in right hip: Secondary | ICD-10-CM | POA: Diagnosis not present

## 2023-05-26 NOTE — ED Provider Notes (Signed)
[place] EMERGENCY DEPARTMENT AT [facility] Provider Note   CSN: [id number] Arrival date & time: 05/26/23  1623     History {Add pertinent medical, surgical, social history, OB history to HPI:1} Chief Complaint  Patient presents with   Fall    Tracy Hayes is a 89 y.o. female.  Level 5 caveat secondary to dementia.  Patient brought in by ambulance from nursing facility after unwitnessed fall.  Patient unable to give any history.  Denies any complaints.  Per EMS she has had a history of falls.  She has a DNR paperwork with her.  The history is provided by the patient and the EMS personnel.  Fall This is a recurrent problem. The problem has not changed since onset.She has tried nothing for the symptoms. The treatment provided no relief.       Home Medications Prior to Admission medications   Medication Sig Start Date End Date Taking? Authorizing Provider  acetaminophen (TYLENOL) 325 MG tablet Take 650 mg by mouth every 6 (six) hours as needed.    [provider]  alendronate (FOSAMAX) 70 MG tablet Take 70 mg by mouth once a week. Take with a full glass of water on an empty stomach. Takes on Saturday    [provider]  Calcium Carb-Cholecalciferol (OYSTER SHELL CALCIUM) 500-400 MG-UNIT TABS Take 1 tablet by mouth 2 (two) times daily.    [provider]  fluticasone (FLONASE) 50 MCG/ACT nasal spray Place 1 spray into both nostrils daily.    [provider]  loperamide (IMODIUM A-D) 2 MG tablet Take 2 mg by mouth 4 (four) times daily as needed for diarrhea or loose stools.    [provider]  LORazepam (ATIVAN) 0.5 MG tablet Take 0.5 mg by mouth 2 (two) times daily.     [provider]  losartan-hydrochlorothiazide (HYZAAR) 100-12.5 MG per tablet Take 1 tablet by mouth daily.    [provider]  predniSONE (DELTASONE) 50 MG tablet Take 50 mg by mouth daily with breakfast.    [provider]   ranitidine (ZANTAC) 150 MG tablet Take 150 mg by mouth 2 (two) times daily.    [provider]  sertraline (ZOLOFT) 50 MG tablet Take 50 mg by mouth 2 (two) times daily.    [provider]      Allergies    Aspirin, Benadryl [diphenhydramine hcl], Codeine, Penicillins, Phenobarbital, Sulfa antibiotics, Vitamin b12, and Caffeine    Review of Systems   Review of Systems  Unable to perform ROS: Dementia    Physical Exam Updated Vital Signs Ht 5\' 4"  (1.626 m)   Wt 59 kg   BMI 22.33 kg/m  Physical Exam Vitals and nursing note reviewed.  Constitutional:      General: She is not in acute distress.    Appearance: Normal appearance. She is well-developed.  HENT:     Head: Normocephalic and atraumatic.  Eyes:     Conjunctiva/sclera: Conjunctivae normal.  Cardiovascular:     Rate and Rhythm: Normal rate and regular rhythm.     Heart sounds: Murmur (sem) heard.  Pulmonary:     Effort: Pulmonary effort is normal. No respiratory distress.     Breath sounds: Normal breath sounds.  Abdominal:     Palpations: Abdomen is soft.     Tenderness: There is no abdominal tenderness. There is no guarding or rebound.  Musculoskeletal:        General: No tenderness or deformity.  Cervical back: Neck supple.     Comments: She has no gross deformity on her neck back upper or lower extremities.  Full range of motion of upper extremities without any pain or limitations.  Full range of motion of her lower extremities without any pain or limitations.  She has some bruising on her knees which appears old.  Skin:    General: Skin is warm and dry.     Capillary Refill: Capillary refill takes less than 2 seconds.     Findings: Bruising present.  Neurological:     General: No focal deficit present.     Mental Status: She is alert. She is disoriented.     Motor: No weakness.     ED Results / Procedures / Treatments   Labs (all labs ordered are listed, but only abnormal results are  displayed) Labs Reviewed - No data to display  EKG EKG Interpretation  Date/Time:  Wednesday May 26 2023 16:45:28 EDT Ventricular Rate:  73 PR Interval:  174 QRS Duration: 135 QT Interval:  458 QTC Calculation: 505 R Axis:   -58 Text Interpretation: Sinus rhythm Atrial premature complex Probable left atrial enlargement RBBB and LAFB No old tracing to compare Confirmed by Meridee Score [phone]) on 05/26/2023 4:47:49 PM  Radiology No results found.  Procedures Procedures  {Document cardiac monitor, telemetry assessment procedure when appropriate:1}  Medications Ordered in ED Medications - No data to display  ED Course/ Medical Decision Making/ A&P   {   Click here for ABCD2, HEART and other calculatorsREFRESH Note before signing :1}                          Medical Decision Making Amount and/or Complexity of Data Reviewed Radiology: ordered.   This patient complains of ***; this involves an extensive number of treatment Options and is a complaint that carries with it a high risk of complications and morbidity. The differential includes ***  I ordered, reviewed and interpreted labs, which included *** I ordered medication *** and reviewed PMP when indicated. I ordered imaging studies which included *** and I independently    visualized and interpreted imaging which showed *** Additional history obtained from *** Previous records obtained and reviewed *** I consulted *** and discussed lab and imaging findings and discussed disposition.  Cardiac monitoring reviewed, *** Social determinants considered, *** Critical Interventions: ***  After the interventions stated above, I reevaluated the patient and found *** Admission and further testing considered, ***   {Document critical care time when appropriate:1} {Document review of labs and clinical decision tools ie heart score, Chads2Vasc2 etc:1}  {Document your independent review of radiology images, and any outside  records:1} {Document your discussion with family members, caretakers, and with consultants:1} {Document social determinants of health affecting pt's care:1} {Document your decision making why or why not admission, treatments were needed:1} Final Clinical Impression(s) / ED Diagnoses Final diagnoses:  None    Rx / DC Orders ED Discharge Orders     None

## 2023-05-26 NOTE — ED Notes (Signed)
Called [facility] for transport back to [place] [facility].Tracy Hayes

## 2023-05-26 NOTE — Discharge Instructions (Signed)
You were seen in the emergency department for evaluation of injuries from a fall.  You had a CAT scan of your head and neck along with chest x-ray and pelvis x-ray that did not show any acute traumatic findings.  Please return to the emergency department if any concerns

## 2023-05-26 NOTE — ED Notes (Signed)
Attempted to call report-no answer 

## 2023-05-26 NOTE — ED Notes (Signed)
Attempted to call report

## 2023-05-26 NOTE — ED Triage Notes (Signed)
Pt was found in her room at the facility she lives in, [address] after a fall. Pt was here earlier this morning according to EMS for a different fall. Pt has a bruise on her right wrist.

## 2023-05-29 DIAGNOSIS — I10 Essential (primary) hypertension: Secondary | ICD-10-CM | POA: Diagnosis not present

## 2023-06-01 DIAGNOSIS — R296 Repeated falls: Secondary | ICD-10-CM | POA: Diagnosis not present

## 2023-06-01 DIAGNOSIS — G309 Alzheimer's disease, unspecified: Secondary | ICD-10-CM | POA: Diagnosis not present

## 2023-06-01 DIAGNOSIS — Z79899 Other long term (current) drug therapy: Secondary | ICD-10-CM | POA: Diagnosis not present

## 2023-06-08 DIAGNOSIS — K219 Gastro-esophageal reflux disease without esophagitis: Secondary | ICD-10-CM | POA: Diagnosis not present

## 2023-06-08 DIAGNOSIS — I739 Peripheral vascular disease, unspecified: Secondary | ICD-10-CM | POA: Diagnosis not present

## 2023-06-08 DIAGNOSIS — K588 Other irritable bowel syndrome: Secondary | ICD-10-CM | POA: Diagnosis not present

## 2023-06-08 DIAGNOSIS — E559 Vitamin D deficiency, unspecified: Secondary | ICD-10-CM | POA: Diagnosis not present

## 2023-06-08 DIAGNOSIS — E1140 Type 2 diabetes mellitus with diabetic neuropathy, unspecified: Secondary | ICD-10-CM | POA: Diagnosis not present

## 2023-06-08 DIAGNOSIS — J3089 Other allergic rhinitis: Secondary | ICD-10-CM | POA: Diagnosis not present

## 2023-06-08 DIAGNOSIS — I10 Essential (primary) hypertension: Secondary | ICD-10-CM | POA: Diagnosis not present

## 2023-06-08 DIAGNOSIS — G8929 Other chronic pain: Secondary | ICD-10-CM | POA: Diagnosis not present

## 2023-06-08 DIAGNOSIS — G309 Alzheimer's disease, unspecified: Secondary | ICD-10-CM | POA: Diagnosis not present

## 2023-06-10 DIAGNOSIS — M1991 Primary osteoarthritis, unspecified site: Secondary | ICD-10-CM | POA: Diagnosis not present

## 2023-06-10 DIAGNOSIS — G309 Alzheimer's disease, unspecified: Secondary | ICD-10-CM | POA: Diagnosis not present

## 2023-06-10 DIAGNOSIS — E119 Type 2 diabetes mellitus without complications: Secondary | ICD-10-CM | POA: Diagnosis not present

## 2023-06-10 DIAGNOSIS — D518 Other vitamin B12 deficiency anemias: Secondary | ICD-10-CM | POA: Diagnosis not present

## 2023-06-10 DIAGNOSIS — I10 Essential (primary) hypertension: Secondary | ICD-10-CM | POA: Diagnosis not present

## 2023-06-10 DIAGNOSIS — M810 Age-related osteoporosis without current pathological fracture: Secondary | ICD-10-CM | POA: Diagnosis not present

## 2023-06-10 DIAGNOSIS — F329 Major depressive disorder, single episode, unspecified: Secondary | ICD-10-CM | POA: Diagnosis not present

## 2023-06-10 DIAGNOSIS — E038 Other specified hypothyroidism: Secondary | ICD-10-CM | POA: Diagnosis not present

## 2023-06-10 DIAGNOSIS — F331 Major depressive disorder, recurrent, moderate: Secondary | ICD-10-CM | POA: Diagnosis not present

## 2023-06-28 DIAGNOSIS — I10 Essential (primary) hypertension: Secondary | ICD-10-CM | POA: Diagnosis not present

## 2023-07-13 DIAGNOSIS — J3089 Other allergic rhinitis: Secondary | ICD-10-CM | POA: Diagnosis not present

## 2023-07-13 DIAGNOSIS — K219 Gastro-esophageal reflux disease without esophagitis: Secondary | ICD-10-CM | POA: Diagnosis not present

## 2023-07-13 DIAGNOSIS — F419 Anxiety disorder, unspecified: Secondary | ICD-10-CM | POA: Diagnosis not present

## 2023-07-13 DIAGNOSIS — G8929 Other chronic pain: Secondary | ICD-10-CM | POA: Diagnosis not present

## 2023-07-13 DIAGNOSIS — G309 Alzheimer's disease, unspecified: Secondary | ICD-10-CM | POA: Diagnosis not present

## 2023-07-13 DIAGNOSIS — E119 Type 2 diabetes mellitus without complications: Secondary | ICD-10-CM | POA: Diagnosis not present

## 2023-07-13 DIAGNOSIS — I10 Essential (primary) hypertension: Secondary | ICD-10-CM | POA: Diagnosis not present

## 2023-07-13 DIAGNOSIS — M810 Age-related osteoporosis without current pathological fracture: Secondary | ICD-10-CM | POA: Diagnosis not present

## 2023-07-13 DIAGNOSIS — R262 Difficulty in walking, not elsewhere classified: Secondary | ICD-10-CM | POA: Diagnosis not present

## 2023-07-14 DIAGNOSIS — M1991 Primary osteoarthritis, unspecified site: Secondary | ICD-10-CM | POA: Diagnosis not present

## 2023-07-14 DIAGNOSIS — D518 Other vitamin B12 deficiency anemias: Secondary | ICD-10-CM | POA: Diagnosis not present

## 2023-07-14 DIAGNOSIS — M810 Age-related osteoporosis without current pathological fracture: Secondary | ICD-10-CM | POA: Diagnosis not present

## 2023-07-14 DIAGNOSIS — I10 Essential (primary) hypertension: Secondary | ICD-10-CM | POA: Diagnosis not present

## 2023-07-14 DIAGNOSIS — E038 Other specified hypothyroidism: Secondary | ICD-10-CM | POA: Diagnosis not present

## 2023-07-14 DIAGNOSIS — F331 Major depressive disorder, recurrent, moderate: Secondary | ICD-10-CM | POA: Diagnosis not present

## 2023-07-14 DIAGNOSIS — G309 Alzheimer's disease, unspecified: Secondary | ICD-10-CM | POA: Diagnosis not present

## 2023-07-14 DIAGNOSIS — F329 Major depressive disorder, single episode, unspecified: Secondary | ICD-10-CM | POA: Diagnosis not present

## 2023-07-21 DIAGNOSIS — F0280 Dementia in other diseases classified elsewhere without behavioral disturbance: Secondary | ICD-10-CM | POA: Diagnosis not present

## 2023-07-21 DIAGNOSIS — R0989 Other specified symptoms and signs involving the circulatory and respiratory systems: Secondary | ICD-10-CM | POA: Diagnosis not present

## 2023-07-21 DIAGNOSIS — G319 Degenerative disease of nervous system, unspecified: Secondary | ICD-10-CM | POA: Diagnosis not present

## 2023-07-21 DIAGNOSIS — S299XXA Unspecified injury of thorax, initial encounter: Secondary | ICD-10-CM | POA: Diagnosis not present

## 2023-07-21 DIAGNOSIS — I6782 Cerebral ischemia: Secondary | ICD-10-CM | POA: Diagnosis not present

## 2023-07-21 DIAGNOSIS — R040 Epistaxis: Secondary | ICD-10-CM | POA: Diagnosis not present

## 2023-07-21 DIAGNOSIS — S8992XA Unspecified injury of left lower leg, initial encounter: Secondary | ICD-10-CM | POA: Diagnosis not present

## 2023-07-21 DIAGNOSIS — K219 Gastro-esophageal reflux disease without esophagitis: Secondary | ICD-10-CM | POA: Diagnosis not present

## 2023-07-21 DIAGNOSIS — W19XXXA Unspecified fall, initial encounter: Secondary | ICD-10-CM | POA: Diagnosis not present

## 2023-07-21 DIAGNOSIS — R58 Hemorrhage, not elsewhere classified: Secondary | ICD-10-CM | POA: Diagnosis not present

## 2023-07-21 DIAGNOSIS — S40011A Contusion of right shoulder, initial encounter: Secondary | ICD-10-CM | POA: Diagnosis not present

## 2023-07-21 DIAGNOSIS — M25511 Pain in right shoulder: Secondary | ICD-10-CM | POA: Diagnosis not present

## 2023-07-21 DIAGNOSIS — Z471 Aftercare following joint replacement surgery: Secondary | ICD-10-CM | POA: Diagnosis not present

## 2023-07-21 DIAGNOSIS — I959 Hypotension, unspecified: Secondary | ICD-10-CM | POA: Diagnosis not present

## 2023-07-21 DIAGNOSIS — I10 Essential (primary) hypertension: Secondary | ICD-10-CM | POA: Diagnosis not present

## 2023-07-21 DIAGNOSIS — R519 Headache, unspecified: Secondary | ICD-10-CM | POA: Diagnosis not present

## 2023-07-21 DIAGNOSIS — F419 Anxiety disorder, unspecified: Secondary | ICD-10-CM | POA: Diagnosis not present

## 2023-07-21 DIAGNOSIS — M545 Low back pain, unspecified: Secondary | ICD-10-CM | POA: Diagnosis not present

## 2023-07-21 DIAGNOSIS — Z96652 Presence of left artificial knee joint: Secondary | ICD-10-CM | POA: Diagnosis not present

## 2023-07-21 DIAGNOSIS — G309 Alzheimer's disease, unspecified: Secondary | ICD-10-CM | POA: Diagnosis not present

## 2023-07-21 DIAGNOSIS — S4991XA Unspecified injury of right shoulder and upper arm, initial encounter: Secondary | ICD-10-CM | POA: Diagnosis not present

## 2023-07-21 DIAGNOSIS — T07XXXA Unspecified multiple injuries, initial encounter: Secondary | ICD-10-CM | POA: Diagnosis not present

## 2023-07-21 DIAGNOSIS — M25562 Pain in left knee: Secondary | ICD-10-CM | POA: Diagnosis not present

## 2023-07-21 DIAGNOSIS — M19011 Primary osteoarthritis, right shoulder: Secondary | ICD-10-CM | POA: Diagnosis not present

## 2023-07-22 DIAGNOSIS — R040 Epistaxis: Secondary | ICD-10-CM | POA: Diagnosis not present

## 2023-07-22 DIAGNOSIS — R296 Repeated falls: Secondary | ICD-10-CM | POA: Diagnosis not present

## 2023-07-28 DIAGNOSIS — R32 Unspecified urinary incontinence: Secondary | ICD-10-CM | POA: Diagnosis not present

## 2023-07-28 DIAGNOSIS — R159 Full incontinence of feces: Secondary | ICD-10-CM | POA: Diagnosis not present

## 2023-07-29 DIAGNOSIS — I10 Essential (primary) hypertension: Secondary | ICD-10-CM | POA: Diagnosis not present

## 2023-08-10 DIAGNOSIS — G309 Alzheimer's disease, unspecified: Secondary | ICD-10-CM | POA: Diagnosis not present

## 2023-08-10 DIAGNOSIS — E1140 Type 2 diabetes mellitus with diabetic neuropathy, unspecified: Secondary | ICD-10-CM | POA: Diagnosis not present

## 2023-08-10 DIAGNOSIS — M810 Age-related osteoporosis without current pathological fracture: Secondary | ICD-10-CM | POA: Diagnosis not present

## 2023-08-10 DIAGNOSIS — G8929 Other chronic pain: Secondary | ICD-10-CM | POA: Diagnosis not present

## 2023-08-10 DIAGNOSIS — I739 Peripheral vascular disease, unspecified: Secondary | ICD-10-CM | POA: Diagnosis not present

## 2023-08-10 DIAGNOSIS — I10 Essential (primary) hypertension: Secondary | ICD-10-CM | POA: Diagnosis not present

## 2023-08-10 DIAGNOSIS — K219 Gastro-esophageal reflux disease without esophagitis: Secondary | ICD-10-CM | POA: Diagnosis not present

## 2023-08-10 DIAGNOSIS — E119 Type 2 diabetes mellitus without complications: Secondary | ICD-10-CM | POA: Diagnosis not present

## 2023-08-10 DIAGNOSIS — R262 Difficulty in walking, not elsewhere classified: Secondary | ICD-10-CM | POA: Diagnosis not present

## 2023-08-17 DIAGNOSIS — R296 Repeated falls: Secondary | ICD-10-CM | POA: Diagnosis not present

## 2023-08-17 DIAGNOSIS — R262 Difficulty in walking, not elsewhere classified: Secondary | ICD-10-CM | POA: Diagnosis not present

## 2023-08-17 DIAGNOSIS — G309 Alzheimer's disease, unspecified: Secondary | ICD-10-CM | POA: Diagnosis not present

## 2023-08-18 DIAGNOSIS — R4182 Altered mental status, unspecified: Secondary | ICD-10-CM | POA: Diagnosis not present

## 2023-08-19 DIAGNOSIS — R059 Cough, unspecified: Secondary | ICD-10-CM | POA: Diagnosis not present

## 2023-08-19 DIAGNOSIS — M25552 Pain in left hip: Secondary | ICD-10-CM | POA: Diagnosis not present

## 2023-08-20 DIAGNOSIS — R8271 Bacteriuria: Secondary | ICD-10-CM | POA: Diagnosis not present

## 2023-08-20 DIAGNOSIS — Z79899 Other long term (current) drug therapy: Secondary | ICD-10-CM | POA: Diagnosis not present

## 2023-08-20 DIAGNOSIS — R400 Somnolence: Secondary | ICD-10-CM | POA: Diagnosis not present

## 2023-08-20 DIAGNOSIS — R0989 Other specified symptoms and signs involving the circulatory and respiratory systems: Secondary | ICD-10-CM | POA: Diagnosis not present

## 2023-08-20 DIAGNOSIS — K219 Gastro-esophageal reflux disease without esophagitis: Secondary | ICD-10-CM | POA: Diagnosis not present

## 2023-08-20 DIAGNOSIS — E860 Dehydration: Secondary | ICD-10-CM | POA: Diagnosis not present

## 2023-08-20 DIAGNOSIS — G309 Alzheimer's disease, unspecified: Secondary | ICD-10-CM | POA: Diagnosis not present

## 2023-08-20 DIAGNOSIS — R001 Bradycardia, unspecified: Secondary | ICD-10-CM | POA: Diagnosis not present

## 2023-08-20 DIAGNOSIS — I959 Hypotension, unspecified: Secondary | ICD-10-CM | POA: Diagnosis not present

## 2023-08-20 DIAGNOSIS — R0902 Hypoxemia: Secondary | ICD-10-CM | POA: Diagnosis not present

## 2023-08-20 DIAGNOSIS — I10 Essential (primary) hypertension: Secondary | ICD-10-CM | POA: Diagnosis not present

## 2023-08-20 DIAGNOSIS — R739 Hyperglycemia, unspecified: Secondary | ICD-10-CM | POA: Diagnosis not present

## 2023-08-20 DIAGNOSIS — K589 Irritable bowel syndrome without diarrhea: Secondary | ICD-10-CM | POA: Diagnosis not present

## 2023-08-20 DIAGNOSIS — R55 Syncope and collapse: Secondary | ICD-10-CM | POA: Diagnosis not present

## 2023-08-20 DIAGNOSIS — F419 Anxiety disorder, unspecified: Secondary | ICD-10-CM | POA: Diagnosis not present

## 2023-08-20 DIAGNOSIS — Z882 Allergy status to sulfonamides status: Secondary | ICD-10-CM | POA: Diagnosis not present

## 2023-08-20 DIAGNOSIS — F0280 Dementia in other diseases classified elsewhere without behavioral disturbance: Secondary | ICD-10-CM | POA: Diagnosis not present

## 2023-08-20 DIAGNOSIS — R4182 Altered mental status, unspecified: Secondary | ICD-10-CM | POA: Diagnosis not present

## 2023-08-20 DIAGNOSIS — R54 Age-related physical debility: Secondary | ICD-10-CM | POA: Diagnosis not present

## 2023-08-21 DIAGNOSIS — I10 Essential (primary) hypertension: Secondary | ICD-10-CM | POA: Diagnosis not present

## 2023-08-21 DIAGNOSIS — R55 Syncope and collapse: Secondary | ICD-10-CM | POA: Diagnosis not present

## 2023-08-21 DIAGNOSIS — Z7401 Bed confinement status: Secondary | ICD-10-CM | POA: Diagnosis not present

## 2023-08-21 DIAGNOSIS — Z79899 Other long term (current) drug therapy: Secondary | ICD-10-CM | POA: Diagnosis not present

## 2023-08-21 DIAGNOSIS — F419 Anxiety disorder, unspecified: Secondary | ICD-10-CM | POA: Diagnosis not present

## 2023-08-21 DIAGNOSIS — E860 Dehydration: Secondary | ICD-10-CM | POA: Diagnosis not present

## 2023-08-21 DIAGNOSIS — K589 Irritable bowel syndrome without diarrhea: Secondary | ICD-10-CM | POA: Diagnosis not present

## 2023-08-21 DIAGNOSIS — R54 Age-related physical debility: Secondary | ICD-10-CM | POA: Diagnosis not present

## 2023-08-23 DIAGNOSIS — S0181XA Laceration without foreign body of other part of head, initial encounter: Secondary | ICD-10-CM | POA: Diagnosis not present

## 2023-08-23 DIAGNOSIS — W050XXA Fall from non-moving wheelchair, initial encounter: Secondary | ICD-10-CM | POA: Diagnosis not present

## 2023-08-23 DIAGNOSIS — G309 Alzheimer's disease, unspecified: Secondary | ICD-10-CM | POA: Diagnosis not present

## 2023-08-23 DIAGNOSIS — W19XXXA Unspecified fall, initial encounter: Secondary | ICD-10-CM | POA: Diagnosis not present

## 2023-08-23 DIAGNOSIS — S5011XA Contusion of right forearm, initial encounter: Secondary | ICD-10-CM | POA: Diagnosis not present

## 2023-08-23 DIAGNOSIS — Z66 Do not resuscitate: Secondary | ICD-10-CM | POA: Diagnosis not present

## 2023-08-23 DIAGNOSIS — I6782 Cerebral ischemia: Secondary | ICD-10-CM | POA: Diagnosis not present

## 2023-08-23 DIAGNOSIS — S5012XA Contusion of left forearm, initial encounter: Secondary | ICD-10-CM | POA: Diagnosis not present

## 2023-08-23 DIAGNOSIS — S0081XA Abrasion of other part of head, initial encounter: Secondary | ICD-10-CM | POA: Diagnosis not present

## 2023-08-23 DIAGNOSIS — Z23 Encounter for immunization: Secondary | ICD-10-CM | POA: Diagnosis not present

## 2023-08-23 DIAGNOSIS — F0280 Dementia in other diseases classified elsewhere without behavioral disturbance: Secondary | ICD-10-CM | POA: Diagnosis not present

## 2023-08-23 DIAGNOSIS — R531 Weakness: Secondary | ICD-10-CM | POA: Diagnosis not present

## 2023-08-23 DIAGNOSIS — I739 Peripheral vascular disease, unspecified: Secondary | ICD-10-CM | POA: Diagnosis not present

## 2023-08-23 DIAGNOSIS — G319 Degenerative disease of nervous system, unspecified: Secondary | ICD-10-CM | POA: Diagnosis not present

## 2023-08-23 DIAGNOSIS — S0083XA Contusion of other part of head, initial encounter: Secondary | ICD-10-CM | POA: Diagnosis not present

## 2023-08-23 DIAGNOSIS — R001 Bradycardia, unspecified: Secondary | ICD-10-CM | POA: Diagnosis not present

## 2023-08-23 DIAGNOSIS — I959 Hypotension, unspecified: Secondary | ICD-10-CM | POA: Diagnosis not present

## 2023-08-23 DIAGNOSIS — S0091XA Abrasion of unspecified part of head, initial encounter: Secondary | ICD-10-CM | POA: Diagnosis not present

## 2023-08-24 DIAGNOSIS — R262 Difficulty in walking, not elsewhere classified: Secondary | ICD-10-CM | POA: Diagnosis not present

## 2023-08-24 DIAGNOSIS — I739 Peripheral vascular disease, unspecified: Secondary | ICD-10-CM | POA: Diagnosis not present

## 2023-08-24 DIAGNOSIS — E559 Vitamin D deficiency, unspecified: Secondary | ICD-10-CM | POA: Diagnosis not present

## 2023-08-24 DIAGNOSIS — M810 Age-related osteoporosis without current pathological fracture: Secondary | ICD-10-CM | POA: Diagnosis not present

## 2023-08-24 DIAGNOSIS — I10 Essential (primary) hypertension: Secondary | ICD-10-CM | POA: Diagnosis not present

## 2023-08-24 DIAGNOSIS — G309 Alzheimer's disease, unspecified: Secondary | ICD-10-CM | POA: Diagnosis not present

## 2023-08-24 DIAGNOSIS — E119 Type 2 diabetes mellitus without complications: Secondary | ICD-10-CM | POA: Diagnosis not present

## 2023-08-24 DIAGNOSIS — E1140 Type 2 diabetes mellitus with diabetic neuropathy, unspecified: Secondary | ICD-10-CM | POA: Diagnosis not present

## 2023-08-24 DIAGNOSIS — G8929 Other chronic pain: Secondary | ICD-10-CM | POA: Diagnosis not present

## 2023-08-25 DIAGNOSIS — Z79899 Other long term (current) drug therapy: Secondary | ICD-10-CM | POA: Diagnosis not present

## 2023-08-25 DIAGNOSIS — D518 Other vitamin B12 deficiency anemias: Secondary | ICD-10-CM | POA: Diagnosis not present

## 2023-08-25 DIAGNOSIS — B351 Tinea unguium: Secondary | ICD-10-CM | POA: Diagnosis not present

## 2023-08-25 DIAGNOSIS — E782 Mixed hyperlipidemia: Secondary | ICD-10-CM | POA: Diagnosis not present

## 2023-08-25 DIAGNOSIS — G309 Alzheimer's disease, unspecified: Secondary | ICD-10-CM | POA: Diagnosis not present

## 2023-08-25 DIAGNOSIS — M79672 Pain in left foot: Secondary | ICD-10-CM | POA: Diagnosis not present

## 2023-08-25 DIAGNOSIS — M810 Age-related osteoporosis without current pathological fracture: Secondary | ICD-10-CM | POA: Diagnosis not present

## 2023-08-25 DIAGNOSIS — L600 Ingrowing nail: Secondary | ICD-10-CM | POA: Diagnosis not present

## 2023-08-25 DIAGNOSIS — F329 Major depressive disorder, single episode, unspecified: Secondary | ICD-10-CM | POA: Diagnosis not present

## 2023-08-25 DIAGNOSIS — E119 Type 2 diabetes mellitus without complications: Secondary | ICD-10-CM | POA: Diagnosis not present

## 2023-08-25 DIAGNOSIS — M79671 Pain in right foot: Secondary | ICD-10-CM | POA: Diagnosis not present

## 2023-08-25 DIAGNOSIS — M2012 Hallux valgus (acquired), left foot: Secondary | ICD-10-CM | POA: Diagnosis not present

## 2023-08-25 DIAGNOSIS — I739 Peripheral vascular disease, unspecified: Secondary | ICD-10-CM | POA: Diagnosis not present

## 2023-08-25 DIAGNOSIS — I10 Essential (primary) hypertension: Secondary | ICD-10-CM | POA: Diagnosis not present

## 2023-08-25 DIAGNOSIS — I83893 Varicose veins of bilateral lower extremities with other complications: Secondary | ICD-10-CM | POA: Diagnosis not present

## 2023-08-25 DIAGNOSIS — M1991 Primary osteoarthritis, unspecified site: Secondary | ICD-10-CM | POA: Diagnosis not present

## 2023-08-25 DIAGNOSIS — E038 Other specified hypothyroidism: Secondary | ICD-10-CM | POA: Diagnosis not present

## 2023-08-25 DIAGNOSIS — F331 Major depressive disorder, recurrent, moderate: Secondary | ICD-10-CM | POA: Diagnosis not present

## 2023-08-26 ENCOUNTER — Emergency Department (HOSPITAL_COMMUNITY): Payer: Medicare HMO

## 2023-08-26 ENCOUNTER — Emergency Department (HOSPITAL_COMMUNITY)
Admission: EM | Admit: 2023-08-26 | Discharge: 2023-08-26 | Disposition: A | Discharge status: Home / Self Care | Payer: Medicare HMO | Attending: Emergency Medicine | Admitting: Emergency Medicine

## 2023-08-26 ENCOUNTER — Other Ambulatory Visit: Payer: Self-pay

## 2023-08-26 DIAGNOSIS — M160 Bilateral primary osteoarthritis of hip: Secondary | ICD-10-CM | POA: Diagnosis not present

## 2023-08-26 DIAGNOSIS — I10 Essential (primary) hypertension: Secondary | ICD-10-CM | POA: Insufficient documentation

## 2023-08-26 DIAGNOSIS — M545 Low back pain, unspecified: Secondary | ICD-10-CM | POA: Diagnosis not present

## 2023-08-26 DIAGNOSIS — S0083XA Contusion of other part of head, initial encounter: Secondary | ICD-10-CM | POA: Diagnosis not present

## 2023-08-26 DIAGNOSIS — W1830XA Fall on same level, unspecified, initial encounter: Secondary | ICD-10-CM | POA: Diagnosis not present

## 2023-08-26 DIAGNOSIS — S7001XA Contusion of right hip, initial encounter: Secondary | ICD-10-CM | POA: Diagnosis not present

## 2023-08-26 DIAGNOSIS — Z91010 Allergy to peanuts: Secondary | ICD-10-CM | POA: Insufficient documentation

## 2023-08-26 DIAGNOSIS — S199XXA Unspecified injury of neck, initial encounter: Secondary | ICD-10-CM | POA: Diagnosis not present

## 2023-08-26 DIAGNOSIS — R918 Other nonspecific abnormal finding of lung field: Secondary | ICD-10-CM | POA: Diagnosis not present

## 2023-08-26 DIAGNOSIS — Z7401 Bed confinement status: Secondary | ICD-10-CM | POA: Diagnosis not present

## 2023-08-26 DIAGNOSIS — R531 Weakness: Secondary | ICD-10-CM | POA: Diagnosis not present

## 2023-08-26 DIAGNOSIS — Z79899 Other long term (current) drug therapy: Secondary | ICD-10-CM | POA: Diagnosis not present

## 2023-08-26 DIAGNOSIS — S7000XA Contusion of unspecified hip, initial encounter: Secondary | ICD-10-CM

## 2023-08-26 DIAGNOSIS — S0990XA Unspecified injury of head, initial encounter: Secondary | ICD-10-CM | POA: Diagnosis not present

## 2023-08-26 DIAGNOSIS — M549 Dorsalgia, unspecified: Secondary | ICD-10-CM | POA: Diagnosis not present

## 2023-08-26 DIAGNOSIS — F0390 Unspecified dementia without behavioral disturbance: Secondary | ICD-10-CM | POA: Insufficient documentation

## 2023-08-26 DIAGNOSIS — I6529 Occlusion and stenosis of unspecified carotid artery: Secondary | ICD-10-CM | POA: Diagnosis not present

## 2023-08-26 DIAGNOSIS — M25551 Pain in right hip: Secondary | ICD-10-CM | POA: Diagnosis not present

## 2023-08-26 NOTE — ED Provider Notes (Signed)
89 year old female presenting from her nursing facility after a mechanical fall.  The patient was evaluated and imaging is ordered.  The plan is to follow-up on imaging for ultimate disposition.  Physical Exam  BP (!) 162/52   Pulse 66   Temp 97.6 F (36.4 C) (Axillary)   Resp 16   Ht 5\' 7"  (1.702 m)   Wt 52.5 kg   SpO2 100%   BMI 18.12 kg/m   Physical Exam Vitals and nursing note reviewed.  Constitutional:      Appearance: Normal appearance.  HENT:     Head: Normocephalic.  Pulmonary:     Effort: Pulmonary effort is normal. No respiratory distress.  Musculoskeletal:        General: Normal range of motion.  Neurological:     Mental Status: She is alert.     Procedures  Procedures  ED Course / MDM    Medical Decision Making Amount and/or Complexity of Data Reviewed Radiology: ordered.   The patient's CT scans and x-rays are unremarkable.  She will be discharged back to her nursing facility.       Durwin Glaze, MD 08/26/23 [phone]

## 2023-08-26 NOTE — Discharge Instructions (Signed)
Your CT scans and x-rays were unremarkable.  Follow-up with your doctor.  Return to the ER for worsening symptoms.

## 2023-08-26 NOTE — ED Notes (Signed)
Received report from v [facility] rn. Pt slightly restless. Trying to get out of bed at times. Nad. Awaiting ems to take back to [facility]

## 2023-08-26 NOTE — ED Triage Notes (Signed)
Pt arrived [facility] for fall at [facility]. Pt hit her head, no blood thinners, no LOC. Pt is c/o back and right hip pain. Pt has Dementia and stood up onto the stretcher for ems. Band aide noted to right forehead. Hematoma and open area to forehead.

## 2023-08-26 NOTE — ED Provider Notes (Signed)
[place] EMERGENCY DEPARTMENT AT [facility] Provider Note   CSN: [id number] Arrival date & time: 08/26/23  1358     History  Chief Complaint  Patient presents with   Fall    Tracy Hayes is a 89 y.o. female.  HPI    89 year old comes in with chief complaint of fall. Patient arrives from nursing home. Per EMS, patient had an unwitnessed fall.  She has history of dementia.  She struck her head and has a bruise.  Patient is not on any blood thinners.  Patient denies any complaints from her side. Home Medications Prior to Admission medications   Medication Sig Start Date End Date Taking? Authorizing Provider  acetaminophen (TYLENOL) 325 MG tablet Take 650 mg by mouth every 6 (six) hours as needed.    [provider]  alendronate (FOSAMAX) 70 MG tablet Take 70 mg by mouth once a week. Take with a full glass of water on an empty stomach. Takes on Saturday    [provider]  Calcium Carb-Cholecalciferol (OYSTER SHELL CALCIUM) 500-400 MG-UNIT TABS Take 1 tablet by mouth 2 (two) times daily.    [provider]  fluticasone (FLONASE) 50 MCG/ACT nasal spray Place 1 spray into both nostrils daily.    [provider]  loperamide (IMODIUM A-D) 2 MG tablet Take 2 mg by mouth 4 (four) times daily as needed for diarrhea or loose stools.    [provider]  LORazepam (ATIVAN) 0.5 MG tablet Take 0.5 mg by mouth 2 (two) times daily.     [provider]  losartan-hydrochlorothiazide (HYZAAR) 100-12.5 MG per tablet Take 1 tablet by mouth daily.    [provider]  predniSONE (DELTASONE) 50 MG tablet Take 50 mg by mouth daily with breakfast.    [provider]  ranitidine (ZANTAC) 150 MG tablet Take 150 mg by mouth 2 (two) times daily.    [provider]  sertraline (ZOLOFT) 50 MG tablet Take 50 mg by mouth 2 (two) times daily.    [provider]      Allergies    Ace inhibitors, Aspirin,  Benadryl [diphenhydramine hcl], Codeine, Peanut-containing drug products, Penicillins, Phenobarbital, Sulfa antibiotics, Vitamin b12, and Caffeine    Review of Systems   Review of Systems  All other systems reviewed and are negative.   Physical Exam Updated Vital Signs BP (!) 162/52   Pulse 66   Temp 97.6 F (36.4 C) (Axillary)   Resp 16   Ht 5\' 7"  (1.702 m)   Wt 52.5 kg   SpO2 100%   BMI 18.12 kg/m  Physical Exam Vitals and nursing note reviewed.  Constitutional:      Appearance: She is well-developed.  HENT:     Head: Atraumatic.  Eyes:     Extraocular Movements: Extraocular movements intact.     Pupils: Pupils are equal, round, and reactive to light.  Cardiovascular:     Rate and Rhythm: Normal rate.  Pulmonary:     Effort: Pulmonary effort is normal.  Musculoskeletal:     Cervical back: Normal range of motion and neck supple.     Comments: Patient has tenderness over the right hip  Otherwise: Head to toe evaluation shows no hematoma, bleeding of the scalp, no facial abrasions, no spine step offs, crepitus of the chest or neck, no tenderness to palpation of the bilateral upper and lower extremities, no gross deformities, no chest tenderness, no pelvic pain.   Skin:    General:  Skin is warm and dry.  Neurological:     Mental Status: She is alert and oriented to person, place, and time. Mental status is at baseline.     ED Results / Procedures / Treatments   Labs (all labs ordered are listed, but only abnormal results are displayed) Labs Reviewed - No data to display  EKG None  Radiology DG Hip Unilat W or Wo Pelvis 2-3 Views Right  Result Date: 08/26/2023 CLINICAL DATA:  fall.  Back and right hip pain. EXAM: DG HIP (WITH OR WITHOUT PELVIS) 2-3V RIGHT COMPARISON:  05/25/2015. FINDINGS: Pelvis is intact with normal and symmetric sacroiliac joints. No acute fracture or dislocation. No aggressive osseous lesion. Visualized sacral arcuate lines are unremarkable.  Unremarkable symphysis pubis. There are mild degenerative changes of bilateral hip joints without significant joint space narrowing. Osteophytosis of the superior acetabulum. No radiopaque foreign bodies. IMPRESSION: *No acute fracture or dislocation. Mild degenerative changes of bilateral hip joints. Electronically Signed   By: Jules Schick M.D.   On: 08/26/2023 18:17   DG Chest 1 View  Result Date: 08/26/2023 CLINICAL DATA:  fall.  Back pain. EXAM: CHEST  1 VIEW COMPARISON:  08/20/2023. FINDINGS: There are tubular lucencies in the left hilar region, which may represent underlying bronchiectasis. Bilateral lung fields are otherwise clear. No acute consolidation or major lung collapse. Bilateral lateral costophrenic angles are clear. Stable cardio-mediastinal silhouette. No acute osseous abnormalities. Redemonstration of probable mild loss of height of T10 vertebral body, grossly similar to the prior study. The soft tissues are within normal limits. IMPRESSION: No acute cardiopulmonary process. Possible bronchiectasis in the left hilar region. Electronically Signed   By: Jules Schick M.D.   On: 08/26/2023 18:16   CT Head Wo Contrast  Result Date: 08/26/2023 CLINICAL DATA:  Polytrauma, blunt.  Fall with head strike. EXAM: CT HEAD WITHOUT CONTRAST CT CERVICAL SPINE WITHOUT CONTRAST TECHNIQUE: Multidetector CT imaging of the head and cervical spine was performed following the standard protocol without intravenous contrast. Multiplanar CT image reconstructions of the cervical spine were also generated. RADIATION DOSE REDUCTION: This exam was performed according to the departmental dose-optimization program which includes automated exposure control, adjustment of the mA and/or kV according to patient size and/or use of iterative reconstruction technique. COMPARISON:  CT head and cervical spine 08/23/2023. FINDINGS: CT HEAD FINDINGS Brain: No acute hemorrhage. Unchanged severe chronic small-vessel disease.  Cortical gray-white differentiation is otherwise preserved. Prominence of the ventricles and sulci within expected range for age. No hydrocephalus or extra-axial collection. No mass effect or midline shift. Vascular: No hyperdense vessel or unexpected calcification. Skull: No calvarial fracture or suspicious bone lesion. Skull base is unremarkable. Sinuses/Orbits: No acute finding. Other: None. CT CERVICAL SPINE FINDINGS Alignment: Normal. Skull base and vertebrae: No acute fracture. Normal craniocervical junction. No suspicious bone lesions. Soft tissues and spinal canal: No prevertebral fluid or swelling. No visible canal hematoma. Disc levels: Mild cervical spondylosis without high-grade spinal canal stenosis. Upper chest: No acute findings. Other: Atherosclerotic calcifications of the carotid bulbs. IMPRESSION: 1. No acute intracranial abnormality. Unchanged severe chronic small-vessel disease. 2. No acute cervical spine fracture or traumatic listhesis. Electronically Signed   By: Orvan Falconer M.D.   On: 08/26/2023 18:04   CT Cervical Spine Wo Contrast  Result Date: 08/26/2023 CLINICAL DATA:  Polytrauma, blunt.  Fall with head strike. EXAM: CT HEAD WITHOUT CONTRAST CT CERVICAL SPINE WITHOUT CONTRAST TECHNIQUE: Multidetector CT imaging of the head and cervical spine was performed following the standard  protocol without intravenous contrast. Multiplanar CT image reconstructions of the cervical spine were also generated. RADIATION DOSE REDUCTION: This exam was performed according to the departmental dose-optimization program which includes automated exposure control, adjustment of the mA and/or kV according to patient size and/or use of iterative reconstruction technique. COMPARISON:  CT head and cervical spine 08/23/2023. FINDINGS: CT HEAD FINDINGS Brain: No acute hemorrhage. Unchanged severe chronic small-vessel disease. Cortical gray-white differentiation is otherwise preserved. Prominence of the  ventricles and sulci within expected range for age. No hydrocephalus or extra-axial collection. No mass effect or midline shift. Vascular: No hyperdense vessel or unexpected calcification. Skull: No calvarial fracture or suspicious bone lesion. Skull base is unremarkable. Sinuses/Orbits: No acute finding. Other: None. CT CERVICAL SPINE FINDINGS Alignment: Normal. Skull base and vertebrae: No acute fracture. Normal craniocervical junction. No suspicious bone lesions. Soft tissues and spinal canal: No prevertebral fluid or swelling. No visible canal hematoma. Disc levels: Mild cervical spondylosis without high-grade spinal canal stenosis. Upper chest: No acute findings. Other: Atherosclerotic calcifications of the carotid bulbs. IMPRESSION: 1. No acute intracranial abnormality. Unchanged severe chronic small-vessel disease. 2. No acute cervical spine fracture or traumatic listhesis. Electronically Signed   By: Orvan Falconer M.D.   On: 08/26/2023 18:04    Procedures Procedures    Medications Ordered in ED Medications - No data to display  ED Course/ Medical Decision Making/ A&P                                 Medical Decision Making Amount and/or Complexity of Data Reviewed Radiology: ordered.   89 year old patient comes in after sustaining what appears to be a mechanical fall. Pertinent past medical includes dementia, hypertension, no blood thinner use. Collateral history provided by EMS.  Based on my history and exam, differential diagnosis includes: - Traumatic brain injury including intracranial hemorrhage - Long bone fractures - Contusions - Soft tissue injury - Concussion  Based on the initial assessment, the following workup was initiated CT scan of the head, cervical spine.  X-ray of the chest and hip.  I have independently interpreted the following imaging from the perspective of acute trauma: And the results indicate no evidence of brain bleed.  CT cervical spine results are  pending.  Patient will be discharged if her results are negative.  She has a hematoma to the forehead, with some superficial abrasion.  No laceration repair needed.     Final Clinical Impression(s) / ED Diagnoses Final diagnoses:  Contusion of hip, unspecified laterality, initial encounter  Closed head injury, initial encounter    Rx / DC Orders ED Discharge Orders     None         Derwood Kaplan, MD 08/26/23 1844

## 2023-08-29 DIAGNOSIS — I10 Essential (primary) hypertension: Secondary | ICD-10-CM | POA: Diagnosis not present

## 2023-08-31 DIAGNOSIS — I70223 Atherosclerosis of native arteries of extremities with rest pain, bilateral legs: Secondary | ICD-10-CM | POA: Diagnosis not present

## 2023-09-02 DIAGNOSIS — E042 Nontoxic multinodular goiter: Secondary | ICD-10-CM | POA: Diagnosis not present

## 2023-09-02 DIAGNOSIS — R221 Localized swelling, mass and lump, neck: Secondary | ICD-10-CM | POA: Diagnosis not present

## 2023-09-07 DIAGNOSIS — R590 Localized enlarged lymph nodes: Secondary | ICD-10-CM | POA: Diagnosis not present

## 2023-09-09 DIAGNOSIS — R2242 Localized swelling, mass and lump, left lower limb: Secondary | ICD-10-CM | POA: Diagnosis not present

## 2023-09-11 ENCOUNTER — Encounter (HOSPITAL_COMMUNITY): Payer: Self-pay | Admitting: *Deleted

## 2023-09-11 ENCOUNTER — Other Ambulatory Visit: Payer: Self-pay

## 2023-09-11 ENCOUNTER — Emergency Department (HOSPITAL_COMMUNITY): Payer: Medicare HMO

## 2023-09-11 ENCOUNTER — Emergency Department (HOSPITAL_COMMUNITY)
Admission: EM | Admit: 2023-09-11 | Discharge: 2023-09-11 | Disposition: A | Discharge status: Skilled Nursing Facility | Payer: Medicare HMO | Attending: Student | Admitting: Student

## 2023-09-11 DIAGNOSIS — R1030 Lower abdominal pain, unspecified: Secondary | ICD-10-CM | POA: Diagnosis present

## 2023-09-11 DIAGNOSIS — K5730 Diverticulosis of large intestine without perforation or abscess without bleeding: Secondary | ICD-10-CM | POA: Diagnosis not present

## 2023-09-11 DIAGNOSIS — N83201 Unspecified ovarian cyst, right side: Secondary | ICD-10-CM

## 2023-09-11 DIAGNOSIS — Z91010 Allergy to peanuts: Secondary | ICD-10-CM | POA: Insufficient documentation

## 2023-09-11 DIAGNOSIS — F0390 Unspecified dementia without behavioral disturbance: Secondary | ICD-10-CM | POA: Insufficient documentation

## 2023-09-11 DIAGNOSIS — I10 Essential (primary) hypertension: Secondary | ICD-10-CM | POA: Diagnosis not present

## 2023-09-11 DIAGNOSIS — N3001 Acute cystitis with hematuria: Secondary | ICD-10-CM

## 2023-09-11 DIAGNOSIS — D259 Leiomyoma of uterus, unspecified: Secondary | ICD-10-CM | POA: Diagnosis not present

## 2023-09-11 DIAGNOSIS — R109 Unspecified abdominal pain: Secondary | ICD-10-CM | POA: Diagnosis not present

## 2023-09-11 DIAGNOSIS — I959 Hypotension, unspecified: Secondary | ICD-10-CM | POA: Diagnosis not present

## 2023-09-11 LAB — COMPREHENSIVE METABOLIC PANEL
ALT: 10 U/L (ref 0–44)
AST: 20 U/L (ref 15–41)
Albumin: 3.8 g/dL (ref 3.5–5.0)
Alkaline Phosphatase: 52 U/L (ref 38–126)
Anion gap: 9 (ref 5–15)
BUN: 21 mg/dL (ref 8–23)
CO2: 25 mmol/L (ref 22–32)
Calcium: 9.1 mg/dL (ref 8.9–10.3)
Chloride: 105 mmol/L (ref 98–111)
Creatinine, Ser: 0.95 mg/dL (ref 0.44–1.00)
GFR, Estimated: 54 mL/min — ABNORMAL LOW (ref >60)
Glucose, Bld: 95 mg/dL (ref 70–99)
Potassium: 4.1 mmol/L (ref 3.5–5.1)
Sodium: 139 mmol/L (ref 135–145)
Total Bilirubin: 0.7 mg/dL (ref 0.3–1.2)
Total Protein: 7.1 g/dL (ref 6.5–8.1)

## 2023-09-11 LAB — URINALYSIS, ROUTINE W REFLEX MICROSCOPIC
Bilirubin Urine: NEGATIVE
Glucose, UA: NEGATIVE mg/dL
Hgb urine dipstick: NEGATIVE
Ketones, ur: NEGATIVE mg/dL
Nitrite: NEGATIVE
Protein, ur: NEGATIVE mg/dL
Specific Gravity, Urine: 1.011 (ref 1.005–1.030)
pH: 7 (ref 5.0–8.0)

## 2023-09-11 LAB — CBC WITH DIFFERENTIAL/PLATELET
Abs Immature Granulocytes: 0.01 10*3/uL (ref 0.00–0.07)
Basophils Absolute: 0 10*3/uL (ref 0.0–0.1)
Basophils Relative: 0 %
Eosinophils Absolute: 0.1 10*3/uL (ref 0.0–0.5)
Eosinophils Relative: 1 %
HCT: 34.5 % — ABNORMAL LOW (ref 36.0–46.0)
Hemoglobin: 10.8 g/dL — ABNORMAL LOW (ref 12.0–15.0)
Immature Granulocytes: 0 %
Lymphocytes Relative: 28 %
Lymphs Abs: 2 10*3/uL (ref 0.7–4.0)
MCH: 31.5 pg (ref 26.0–34.0)
MCHC: 31.3 g/dL (ref 30.0–36.0)
MCV: 100.6 fL — ABNORMAL HIGH (ref 80.0–100.0)
Monocytes Absolute: 0.6 10*3/uL (ref 0.1–1.0)
Monocytes Relative: 9 %
Neutro Abs: 4.4 10*3/uL (ref 1.7–7.7)
Neutrophils Relative %: 62 %
Platelets: 205 10*3/uL (ref 150–400)
RBC: 3.43 MIL/uL — ABNORMAL LOW (ref 3.87–5.11)
RDW: 13.4 % (ref 11.5–15.5)
WBC: 7 10*3/uL (ref 4.0–10.5)
nRBC: 0 % (ref 0.0–0.2)

## 2023-09-11 LAB — LIPASE, BLOOD: Lipase: 42 U/L (ref 11–51)

## 2023-09-11 MED ORDER — IOHEXOL 300 MG/ML  SOLN
100.0000 mL | Freq: Once | INTRAMUSCULAR | Status: AC | PRN
  Administered 2023-09-11: 100 mL via INTRAVENOUS

## 2023-09-11 MED ORDER — CEFDINIR 300 MG PO CAPS: 300.0000 mg | ORAL_CAPSULE | Freq: Every day | ORAL | 0 refills | Status: AC

## 2023-09-11 MED ORDER — CEFDINIR 300 MG PO CAPS
300.0000 mg | ORAL_CAPSULE | Freq: Once | ORAL | Status: AC
  Administered 2023-09-11: 300 mg via ORAL
  Filled 2023-09-11: qty 1

## 2023-09-11 NOTE — ED Provider Notes (Signed)
[place] EMERGENCY DEPARTMENT AT [facility] Provider Note   CSN: [id number] Arrival date & time: 09/11/23  1120     History  Chief Complaint  Patient presents with   Abdominal Pain    Tracy Hayes is a 89 y.o. female.  She has PMH of dementia not able to provide much history but complains of lower abdominal pain.  Sent from nursing home.  She resides at [facility]    Abdominal Pain      Home Medications Prior to Admission medications   Medication Sig Start Date End Date Taking? Authorizing Provider  cefdinir (OMNICEF) 300 MG capsule Take 1 capsule (300 mg total) by mouth daily for 4 days. 09/11/23 09/15/23 Yes Teriann Livingood A, PA-C  acetaminophen (TYLENOL) 325 MG tablet Take 650 mg by mouth every 6 (six) hours as needed.    [provider]  alendronate (FOSAMAX) 70 MG tablet Take 70 mg by mouth once a week. Take with a full glass of water on an empty stomach. Takes on Saturday    [provider]  Calcium Carb-Cholecalciferol (OYSTER SHELL CALCIUM) 500-400 MG-UNIT TABS Take 1 tablet by mouth 2 (two) times daily.    [provider]  fluticasone (FLONASE) 50 MCG/ACT nasal spray Place 1 spray into both nostrils daily.    [provider]  loperamide (IMODIUM A-D) 2 MG tablet Take 2 mg by mouth 4 (four) times daily as needed for diarrhea or loose stools.    [provider]  LORazepam (ATIVAN) 0.5 MG tablet Take 0.5 mg by mouth 2 (two) times daily.     [provider]  losartan-hydrochlorothiazide (HYZAAR) 100-12.5 MG per tablet Take 1 tablet by mouth daily.    [provider]  predniSONE (DELTASONE) 50 MG tablet Take 50 mg by mouth daily with breakfast.    [provider]  ranitidine (ZANTAC) 150 MG tablet Take 150 mg by mouth 2 (two) times daily.    [provider]  sertraline (ZOLOFT) 50 MG tablet Take 50 mg by mouth 2 (two) times daily.    [provider]       Allergies    Ace inhibitors, Aspirin, Benadryl [diphenhydramine hcl], Codeine, Peanut-containing drug products, Penicillins, Phenobarbital, Sulfa antibiotics, Vitamin b12, and Caffeine    Review of Systems   Review of Systems  Gastrointestinal:  Positive for abdominal pain.    Physical Exam Updated Vital Signs BP 129/64 (BP Location: Left Arm)   Pulse 69   Temp (!) 97.5 F (36.4 C) (Oral)   Resp 17   Ht 5\' 7"  (1.702 m)   Wt 52.5 kg   SpO2 96%   BMI 18.13 kg/m  Physical Exam Vitals and nursing note reviewed.  Constitutional:      General: She is not in acute distress.    Appearance: She is well-developed.  HENT:     Head: Normocephalic and atraumatic.  Eyes:     Conjunctiva/sclera: Conjunctivae normal.  Cardiovascular:     Rate and Rhythm: Normal rate and regular rhythm.     Heart sounds: No murmur heard. Pulmonary:     Effort: Pulmonary effort is normal. No respiratory distress.     Breath sounds: Normal breath sounds.  Abdominal:     Palpations: Abdomen is soft.     Tenderness: There is abdominal tenderness in the suprapubic area. There is no right CVA tenderness, left CVA tenderness, guarding or rebound.  Musculoskeletal:        General: No  swelling.     Cervical back: Neck supple.     Right lower leg: No swelling. No edema.     Left lower leg: No swelling. No edema.  Skin:    General: Skin is warm and dry.     Capillary Refill: Capillary refill takes less than 2 seconds.  Neurological:     Mental Status: She is alert.  Psychiatric:        Mood and Affect: Mood normal.     ED Results / Procedures / Treatments   Labs (all labs ordered are listed, but only abnormal results are displayed) Labs Reviewed  CBC WITH DIFFERENTIAL/PLATELET - Abnormal; Notable for the following components:      Result Value   RBC 3.43 (*)    Hemoglobin 10.8 (*)    HCT 34.5 (*)    MCV 100.6 (*)    All other components within normal limits  COMPREHENSIVE METABOLIC PANEL -  Abnormal; Notable for the following components:   GFR, Estimated 54 (*)    All other components within normal limits  URINALYSIS, ROUTINE W REFLEX MICROSCOPIC - Abnormal; Notable for the following components:   APPearance HAZY (*)    Leukocytes,Ua SMALL (*)    Bacteria, UA RARE (*)    All other components within normal limits  URINE CULTURE  LIPASE, BLOOD    EKG EKG Interpretation Date/Time:  Saturday September 11 2023 11:37:07 EDT Ventricular Rate:  67 PR Interval:  162 QRS Duration:  125 QT Interval:  470 QTC Calculation: 497 R Axis:   -32  Text Interpretation: Sinus rhythm Probable left atrial enlargement Right bundle branch block Confirmed by Vonita Moss [id number]) on 09/11/2023 5:30:59 PM  Radiology CT ABDOMEN PELVIS W CONTRAST  Result Date: 09/11/2023 CLINICAL DATA:  Acute abdominal pain EXAM: CT ABDOMEN AND PELVIS WITH CONTRAST TECHNIQUE: Multidetector CT imaging of the abdomen and pelvis was performed using the standard protocol following bolus administration of intravenous contrast. RADIATION DOSE REDUCTION: This exam was performed according to the departmental dose-optimization program which includes automated exposure control, adjustment of the mA and/or kV according to patient size and/or use of iterative reconstruction technique. CONTRAST:  OMNIPAQUE IOHEXOL 300 MG/ML  SOLN COMPARISON:  06/24/2016 FINDINGS: Lower chest: No acute abnormality. Hepatobiliary: No focal liver abnormality is seen. No gallstones, gallbladder wall thickening, or biliary dilatation. Pancreas: No signs of pancreatic inflammation or mass. Increased caliber of the main duct at the level of the pancreatic head is unchanged from previous exam measuring 5 mm. Spleen: Normal in size without focal abnormality. Adrenals/Urinary Tract: Normal adrenal glands. Mild bilateral renal cortical. Thinning. No kidney stones or signs of obstructive uropathy. No kidney mass identified. The bladder appears normal.  Stomach/Bowel: None stomach is nondistended. The appendix is not confidently identified. No pericecal inflammatory change. There is no pathologic dilatation of the large or small bowel loops. Distal colonic diverticulosis without signs of acute diverticulitis. Vascular/Lymphatic: Aortic atherosclerosis. No aneurysm. Patent upper abdominal vascularity. No abdominopelvic adenopathy. Reproductive: Small calcified uterine fibroid. Right pelvic cyst which is likely arising off the right ovary measures 9.7 x 5.9 cm. This appears unilocular without areas of internal septation or mural nodularity. On the previous exam this measured 5.5 by 3.8 cm. Normal left ovary. Other: No ascites or focal fluid collections. No signs of pneumoperitoneum. Musculoskeletal: Bones appear diffusely osteopenic. Previous vertebroplasty at L5. Inferior endplate compression deformity involving the L2 vertebra is identified with loss of approximately 40% of the anterior vertebral body height. Mild retropulsion of  bone is identified off the inferior endplate of L2 measuring 7 mm. This is age indeterminate but appears chronic on today's study. IMPRESSION: 1. No acute findings within the abdomen or pelvis. 2. Distal colonic diverticulosis without signs of acute diverticulitis. 3. Right pelvic cyst which is likely arising off the right ovary measures 9.7 x 5.9 cm. This appears unilocular without areas of internal septation or mural nodularity. On the previous exam this measured 5.5 by 3.8 cm. Recommend follow-up pelvic ultrasound in 6-12 months. 4. Inferior endplate compression deformity involving the L2 vertebra is identified with loss of approximately 40% of the anterior vertebral body height. Mild retropulsion of bone is identified off the inferior endplate of L2 measuring 7 mm. This is age indeterminate but appears chronic on today's study. 5.  Aortic Atherosclerosis (ICD10-I70.0). Electronically Signed   By: Signa Kell M.D.   On: 09/11/2023  16:56    Procedures Procedures    Medications Ordered in ED Medications  iohexol (OMNIPAQUE) 300 MG/ML solution 100 mL (100 mLs Intravenous Contrast Given 09/11/23 1547)  cefdinir (OMNICEF) capsule 300 mg (300 mg Oral Given 09/11/23 1806)    ED Course/ Medical Decision Making/ A&P Clinical Course as of 09/11/23 1913  Sat Sep 11, 2023  1659 Patient unable to provide much history due to her dementia but complaining of lower abdominal pain today, noted to have history of IBS and chronic abdominal pain but does have tenderness and some mild lower abdominal distention on exam.  Vitals are reassuring, labs reassuring, does appear to have mild UTI.  CT pending. [CB]    Clinical Course User Index [CB] Ma Rings, PA-C                                 Medical Decision Making Differential Diagnosis: UTI, diverticulitis, kidney stone, ovarian cyst, ovarian torsion, colitis, chronic pain, appendicitis, cholecystitis, pancreatitis, other  Course: Patient here for lower abdominal pain, has UTI which will be treated, urine culture was sent.  Patient's creatinine creatinines less than 30 so adjusted her dose to 300 mg daily. CT notes a large right pelvic cyst likely from the ovary.  Larger than on previous imaging per radiology.  Also noted incidental finding of likely old lumbar fracture.  Consults: Spoke with Dr.Eure oncology who states patient would potentially benefit from transabdominal drainage of the cyst in the office.  A secure chat was sent to him with the patient's son's information so he can call Monday to discuss the procedure and get consent.  I spoke with patient's on the phone who is agreeable and understands to expect a phone call on Monday.  Amount and/or Complexity of Data Reviewed External Data Reviewed: labs and notes.    Details: Multiple visits for falls, most recent labs noted to be 2009 Labs: ordered.    Details: Hemoglobin 10.8, no recent baseline, white blood cell  count, CMP reassuring GFR slightly decreased at 54 with no recent baseline, normal lipase, urinalysis shows small leuks, 0-10 red blood cells, 21-50 white blood cells and rare bacteria on catheterized specimen Radiology: ordered and independent interpretation performed. Decision-making details documented in ED Course.    Details: The abdomen pelvis shows a large right pelvic mass arising from adnexa fluid-filled with no loculations or septations or acute findings I agree with radiology read, as follows- IMPRESSION: 1. No acute findings within the abdomen or pelvis. 2. Distal colonic diverticulosis without signs of acute diverticulitis.  3. Right pelvic cyst which is likely arising off the right ovary measures 9.7 x 5.9 cm. This appears unilocular without areas of internal septation or mural nodularity. On the previous exam this measured 5.5 by 3.8 cm. Recommend follow-up pelvic ultrasound in 6-12 months. 4. Inferior endplate compression deformity involving the L2 vertebra is identified with loss of approximately 40% of the anterior vertebral body height. Mild retropulsion of bone is identified off the inferior endplate of L2 measuring 7 mm. This is age indeterminate but appears chronic on today's study. 5.  Aortic Atherosclerosis (ICD10-I70.0).   Risk Prescription drug management.           Final Clinical Impression(s) / ED Diagnoses Final diagnoses:  Acute cystitis with hematuria  Cyst of right ovary    Rx / DC Orders ED Discharge Orders          Ordered    cefdinir (OMNICEF) 300 MG capsule  Daily        09/11/23 1904              Josem Kaufmann 09/11/23 1913    Rondel Baton, MD 09/13/23 [phone]

## 2023-09-11 NOTE — ED Triage Notes (Signed)
Pt BIB [facility] from [facility]. Reported pt with Alzheimer's .  Reported pt ate breakfast. Reported pt with chronic pain to abd, back and left leg.

## 2023-09-11 NOTE — ED Notes (Signed)
Pt pulled off cardiac leads and IV. Staff in ER to take pt to CT. Leads reapplied. Urine obtained. Clean brief applied. Pt to go to CT.

## 2023-09-11 NOTE — ED Notes (Signed)
Pt incontinent of bladder. Bedding and clothing soiled. Partial bed bath given and linens changed. Dry brief applied. Attempted to obtain urine and unsuccessful. Will try again. Pt now comfortable.

## 2023-09-11 NOTE — Discharge Instructions (Addendum)
You are seen today for abdominal pain, you have a UTI and we are prescribing antibiotics.  You got your first dose today next dose will be tomorrow afternoon.  Make sure to follow-up with primary care in the next couple of days so they can check on the urine culture and make sure you are on the right antibiotics.  Can also review your incidental findings on CT scan, you had findings of a likely old lumbar fracture of the L2 vertebrae and aortic atherosclerosis  You also have a very large right ovarian cyst.  I discussed with the gynecologist Dr. Despina Hidden who will be calling Monday to discuss a procedure to drain this large cyst to help with pain control.    Come back to the ER for new or worsening symptoms you can take over-the-counter Tylenol as needed for discomfort.

## 2023-09-11 NOTE — ED Notes (Signed)
Pt pulled out IV again along with cardiac leads and blood pressure cuff. Leads and cuff reapplied.

## 2023-09-11 NOTE — ED Notes (Signed)
New IV placed. CT notified.

## 2023-09-11 NOTE — ED Notes (Signed)
Pt returned from CT °

## 2023-09-11 NOTE — ED Notes (Signed)
Pt given warm blanket.

## 2023-09-12 DIAGNOSIS — E860 Dehydration: Secondary | ICD-10-CM | POA: Diagnosis not present

## 2023-09-12 DIAGNOSIS — R9431 Abnormal electrocardiogram [ECG] [EKG]: Secondary | ICD-10-CM | POA: Diagnosis not present

## 2023-09-12 DIAGNOSIS — R55 Syncope and collapse: Secondary | ICD-10-CM | POA: Diagnosis not present

## 2023-09-12 DIAGNOSIS — Z79899 Other long term (current) drug therapy: Secondary | ICD-10-CM | POA: Diagnosis not present

## 2023-09-12 DIAGNOSIS — F0280 Dementia in other diseases classified elsewhere without behavioral disturbance: Secondary | ICD-10-CM | POA: Diagnosis not present

## 2023-09-12 DIAGNOSIS — I452 Bifascicular block: Secondary | ICD-10-CM | POA: Diagnosis not present

## 2023-09-12 DIAGNOSIS — I739 Peripheral vascular disease, unspecified: Secondary | ICD-10-CM | POA: Diagnosis not present

## 2023-09-12 DIAGNOSIS — I10 Essential (primary) hypertension: Secondary | ICD-10-CM | POA: Diagnosis not present

## 2023-09-12 DIAGNOSIS — I959 Hypotension, unspecified: Secondary | ICD-10-CM | POA: Diagnosis not present

## 2023-09-12 DIAGNOSIS — R0902 Hypoxemia: Secondary | ICD-10-CM | POA: Diagnosis not present

## 2023-09-12 DIAGNOSIS — G309 Alzheimer's disease, unspecified: Secondary | ICD-10-CM | POA: Diagnosis not present

## 2023-09-13 DIAGNOSIS — I6523 Occlusion and stenosis of bilateral carotid arteries: Secondary | ICD-10-CM | POA: Diagnosis not present

## 2023-09-13 DIAGNOSIS — R0989 Other specified symptoms and signs involving the circulatory and respiratory systems: Secondary | ICD-10-CM | POA: Diagnosis not present

## 2023-09-13 LAB — URINE CULTURE: Culture: >=100000 — AB

## 2023-09-14 ENCOUNTER — Telehealth (HOSPITAL_BASED_OUTPATIENT_CLINIC_OR_DEPARTMENT_OTHER): Payer: Self-pay | Admitting: *Deleted

## 2023-09-14 DIAGNOSIS — R100 Acute abdomen: Secondary | ICD-10-CM | POA: Diagnosis not present

## 2023-09-14 DIAGNOSIS — N3001 Acute cystitis with hematuria: Secondary | ICD-10-CM | POA: Diagnosis not present

## 2023-09-14 DIAGNOSIS — N83201 Unspecified ovarian cyst, right side: Secondary | ICD-10-CM | POA: Diagnosis not present

## 2023-09-14 NOTE — Telephone Encounter (Signed)
Post ED Visit - Positive Culture Follow-up  Culture report reviewed by antimicrobial stewardship pharmacist: [facility] [x]  Anda Kraft, Pharm.D. []  Celedonio Miyamoto, Pharm.D., BCPS AQ-ID []  Garvin Fila, Pharm.D., BCPS []  Georgina Pillion, Pharm.D., BCPS []  [place], [place].D., BCPS, AAHIVP []  Estella Husk, Pharm.D., BCPS, AAHIVP []  Lysle Pearl, PharmD, BCPS []  Phillips Climes, PharmD, BCPS []  Agapito Games, PharmD, BCPS []  Verlan Friends, PharmD []  Mervyn Gay, PharmD, BCPS []  Vinnie Level, PharmD  [facility] []  Len Childs, PharmD []  Greer Pickerel, PharmD []  Adalberto Cole, PharmD []  Perlie Gold, Rph []  Lonell Face) Jean Rosenthal, PharmD []  Earl Many, PharmD []  Junita Push, PharmD []  Dorna Leitz, PharmD []  Terrilee Files, PharmD []  Lynann Beaver, PharmD []  Keturah Barre, PharmD []  Loralee Pacas, PharmD []  Bernadene Person, PharmD   Positive urine culture Treated with Cefdinir, organism sensitive to the same and no further patient follow-up is required at this time.  Virl Axe [facility] 09/14/2023, 8:09 AM

## 2023-09-15 DIAGNOSIS — I77811 Abdominal aortic ectasia: Secondary | ICD-10-CM | POA: Diagnosis not present

## 2023-09-16 DIAGNOSIS — G309 Alzheimer's disease, unspecified: Secondary | ICD-10-CM | POA: Diagnosis not present

## 2023-09-16 DIAGNOSIS — F331 Major depressive disorder, recurrent, moderate: Secondary | ICD-10-CM | POA: Diagnosis not present

## 2023-09-16 DIAGNOSIS — M1991 Primary osteoarthritis, unspecified site: Secondary | ICD-10-CM | POA: Diagnosis not present

## 2023-09-16 DIAGNOSIS — M810 Age-related osteoporosis without current pathological fracture: Secondary | ICD-10-CM | POA: Diagnosis not present

## 2023-09-16 DIAGNOSIS — F329 Major depressive disorder, single episode, unspecified: Secondary | ICD-10-CM | POA: Diagnosis not present

## 2023-09-16 DIAGNOSIS — D518 Other vitamin B12 deficiency anemias: Secondary | ICD-10-CM | POA: Diagnosis not present

## 2023-09-16 DIAGNOSIS — I10 Essential (primary) hypertension: Secondary | ICD-10-CM | POA: Diagnosis not present

## 2023-09-16 DIAGNOSIS — I70223 Atherosclerosis of native arteries of extremities with rest pain, bilateral legs: Secondary | ICD-10-CM | POA: Diagnosis not present

## 2023-09-16 DIAGNOSIS — E038 Other specified hypothyroidism: Secondary | ICD-10-CM | POA: Diagnosis not present

## 2023-09-28 DIAGNOSIS — I10 Essential (primary) hypertension: Secondary | ICD-10-CM | POA: Diagnosis not present

## 2023-10-18 DIAGNOSIS — E782 Mixed hyperlipidemia: Secondary | ICD-10-CM | POA: Diagnosis not present

## 2023-10-18 DIAGNOSIS — G309 Alzheimer's disease, unspecified: Secondary | ICD-10-CM | POA: Diagnosis not present

## 2023-10-18 DIAGNOSIS — F329 Major depressive disorder, single episode, unspecified: Secondary | ICD-10-CM | POA: Diagnosis not present

## 2023-10-18 DIAGNOSIS — M1991 Primary osteoarthritis, unspecified site: Secondary | ICD-10-CM | POA: Diagnosis not present

## 2023-10-18 DIAGNOSIS — I10 Essential (primary) hypertension: Secondary | ICD-10-CM | POA: Diagnosis not present

## 2023-10-18 DIAGNOSIS — D518 Other vitamin B12 deficiency anemias: Secondary | ICD-10-CM | POA: Diagnosis not present

## 2023-10-18 DIAGNOSIS — F331 Major depressive disorder, recurrent, moderate: Secondary | ICD-10-CM | POA: Diagnosis not present

## 2023-10-18 DIAGNOSIS — E119 Type 2 diabetes mellitus without complications: Secondary | ICD-10-CM | POA: Diagnosis not present

## 2023-10-18 DIAGNOSIS — M810 Age-related osteoporosis without current pathological fracture: Secondary | ICD-10-CM | POA: Diagnosis not present

## 2023-10-19 DIAGNOSIS — E119 Type 2 diabetes mellitus without complications: Secondary | ICD-10-CM | POA: Diagnosis not present

## 2023-10-19 DIAGNOSIS — K219 Gastro-esophageal reflux disease without esophagitis: Secondary | ICD-10-CM | POA: Diagnosis not present

## 2023-10-19 DIAGNOSIS — E559 Vitamin D deficiency, unspecified: Secondary | ICD-10-CM | POA: Diagnosis not present

## 2023-10-19 DIAGNOSIS — I739 Peripheral vascular disease, unspecified: Secondary | ICD-10-CM | POA: Diagnosis not present

## 2023-10-19 DIAGNOSIS — G309 Alzheimer's disease, unspecified: Secondary | ICD-10-CM | POA: Diagnosis not present

## 2023-10-19 DIAGNOSIS — I10 Essential (primary) hypertension: Secondary | ICD-10-CM | POA: Diagnosis not present

## 2023-10-19 DIAGNOSIS — E1140 Type 2 diabetes mellitus with diabetic neuropathy, unspecified: Secondary | ICD-10-CM | POA: Diagnosis not present

## 2023-10-19 DIAGNOSIS — M810 Age-related osteoporosis without current pathological fracture: Secondary | ICD-10-CM | POA: Diagnosis not present

## 2023-10-29 DIAGNOSIS — I10 Essential (primary) hypertension: Secondary | ICD-10-CM | POA: Diagnosis not present

## 2023-11-12 DIAGNOSIS — Z79899 Other long term (current) drug therapy: Secondary | ICD-10-CM | POA: Diagnosis not present

## 2023-11-12 DIAGNOSIS — F329 Major depressive disorder, single episode, unspecified: Secondary | ICD-10-CM | POA: Diagnosis not present

## 2023-11-12 DIAGNOSIS — E782 Mixed hyperlipidemia: Secondary | ICD-10-CM | POA: Diagnosis not present

## 2023-11-12 DIAGNOSIS — F331 Major depressive disorder, recurrent, moderate: Secondary | ICD-10-CM | POA: Diagnosis not present

## 2023-11-12 DIAGNOSIS — G309 Alzheimer's disease, unspecified: Secondary | ICD-10-CM | POA: Diagnosis not present

## 2023-11-12 DIAGNOSIS — M810 Age-related osteoporosis without current pathological fracture: Secondary | ICD-10-CM | POA: Diagnosis not present

## 2023-11-12 DIAGNOSIS — D519 Vitamin B12 deficiency anemia, unspecified: Secondary | ICD-10-CM | POA: Diagnosis not present

## 2023-11-12 DIAGNOSIS — E119 Type 2 diabetes mellitus without complications: Secondary | ICD-10-CM | POA: Diagnosis not present

## 2023-11-12 DIAGNOSIS — E038 Other specified hypothyroidism: Secondary | ICD-10-CM | POA: Diagnosis not present

## 2023-11-12 DIAGNOSIS — I10 Essential (primary) hypertension: Secondary | ICD-10-CM | POA: Diagnosis not present

## 2023-11-12 DIAGNOSIS — M1991 Primary osteoarthritis, unspecified site: Secondary | ICD-10-CM | POA: Diagnosis not present

## 2023-11-25 DIAGNOSIS — E782 Mixed hyperlipidemia: Secondary | ICD-10-CM | POA: Diagnosis not present

## 2023-11-25 DIAGNOSIS — E038 Other specified hypothyroidism: Secondary | ICD-10-CM | POA: Diagnosis not present

## 2023-11-25 DIAGNOSIS — Z79899 Other long term (current) drug therapy: Secondary | ICD-10-CM | POA: Diagnosis not present

## 2023-11-25 DIAGNOSIS — E119 Type 2 diabetes mellitus without complications: Secondary | ICD-10-CM | POA: Diagnosis not present

## 2023-11-25 DIAGNOSIS — D519 Vitamin B12 deficiency anemia, unspecified: Secondary | ICD-10-CM | POA: Diagnosis not present

## 2023-11-28 DIAGNOSIS — I10 Essential (primary) hypertension: Secondary | ICD-10-CM | POA: Diagnosis not present

## 2023-11-30 DIAGNOSIS — R262 Difficulty in walking, not elsewhere classified: Secondary | ICD-10-CM | POA: Diagnosis not present

## 2023-11-30 DIAGNOSIS — E119 Type 2 diabetes mellitus without complications: Secondary | ICD-10-CM | POA: Diagnosis not present

## 2023-11-30 DIAGNOSIS — I739 Peripheral vascular disease, unspecified: Secondary | ICD-10-CM | POA: Diagnosis not present

## 2023-11-30 DIAGNOSIS — I10 Essential (primary) hypertension: Secondary | ICD-10-CM | POA: Diagnosis not present

## 2023-11-30 DIAGNOSIS — M810 Age-related osteoporosis without current pathological fracture: Secondary | ICD-10-CM | POA: Diagnosis not present

## 2023-11-30 DIAGNOSIS — N83201 Unspecified ovarian cyst, right side: Secondary | ICD-10-CM | POA: Diagnosis not present

## 2023-11-30 DIAGNOSIS — E1140 Type 2 diabetes mellitus with diabetic neuropathy, unspecified: Secondary | ICD-10-CM | POA: Diagnosis not present

## 2023-11-30 DIAGNOSIS — E559 Vitamin D deficiency, unspecified: Secondary | ICD-10-CM | POA: Diagnosis not present

## 2023-11-30 DIAGNOSIS — K219 Gastro-esophageal reflux disease without esophagitis: Secondary | ICD-10-CM | POA: Diagnosis not present

## 2023-12-23 DIAGNOSIS — E782 Mixed hyperlipidemia: Secondary | ICD-10-CM | POA: Diagnosis not present

## 2023-12-23 DIAGNOSIS — M79672 Pain in left foot: Secondary | ICD-10-CM | POA: Diagnosis not present

## 2023-12-23 DIAGNOSIS — I10 Essential (primary) hypertension: Secondary | ICD-10-CM | POA: Diagnosis not present

## 2023-12-23 DIAGNOSIS — I739 Peripheral vascular disease, unspecified: Secondary | ICD-10-CM | POA: Diagnosis not present

## 2023-12-23 DIAGNOSIS — F329 Major depressive disorder, single episode, unspecified: Secondary | ICD-10-CM | POA: Diagnosis not present

## 2023-12-23 DIAGNOSIS — I83893 Varicose veins of bilateral lower extremities with other complications: Secondary | ICD-10-CM | POA: Diagnosis not present

## 2023-12-23 DIAGNOSIS — M2012 Hallux valgus (acquired), left foot: Secondary | ICD-10-CM | POA: Diagnosis not present

## 2023-12-23 DIAGNOSIS — F331 Major depressive disorder, recurrent, moderate: Secondary | ICD-10-CM | POA: Diagnosis not present

## 2023-12-23 DIAGNOSIS — G309 Alzheimer's disease, unspecified: Secondary | ICD-10-CM | POA: Diagnosis not present

## 2023-12-23 DIAGNOSIS — M79671 Pain in right foot: Secondary | ICD-10-CM | POA: Diagnosis not present

## 2023-12-23 DIAGNOSIS — M810 Age-related osteoporosis without current pathological fracture: Secondary | ICD-10-CM | POA: Diagnosis not present

## 2023-12-23 DIAGNOSIS — E119 Type 2 diabetes mellitus without complications: Secondary | ICD-10-CM | POA: Diagnosis not present

## 2023-12-23 DIAGNOSIS — B351 Tinea unguium: Secondary | ICD-10-CM | POA: Diagnosis not present

## 2023-12-23 DIAGNOSIS — M1991 Primary osteoarthritis, unspecified site: Secondary | ICD-10-CM | POA: Diagnosis not present

## 2023-12-23 DIAGNOSIS — L600 Ingrowing nail: Secondary | ICD-10-CM | POA: Diagnosis not present

## 2023-12-23 DIAGNOSIS — D518 Other vitamin B12 deficiency anemias: Secondary | ICD-10-CM | POA: Diagnosis not present

## 2023-12-28 DIAGNOSIS — E119 Type 2 diabetes mellitus without complications: Secondary | ICD-10-CM | POA: Diagnosis not present

## 2023-12-28 DIAGNOSIS — M810 Age-related osteoporosis without current pathological fracture: Secondary | ICD-10-CM | POA: Diagnosis not present

## 2023-12-28 DIAGNOSIS — N189 Chronic kidney disease, unspecified: Secondary | ICD-10-CM | POA: Diagnosis not present

## 2023-12-28 DIAGNOSIS — I739 Peripheral vascular disease, unspecified: Secondary | ICD-10-CM | POA: Diagnosis not present

## 2023-12-28 DIAGNOSIS — K219 Gastro-esophageal reflux disease without esophagitis: Secondary | ICD-10-CM | POA: Diagnosis not present

## 2023-12-28 DIAGNOSIS — I10 Essential (primary) hypertension: Secondary | ICD-10-CM | POA: Diagnosis not present

## 2023-12-29 DIAGNOSIS — I10 Essential (primary) hypertension: Secondary | ICD-10-CM | POA: Diagnosis not present

## 2024-06-15 ENCOUNTER — Emergency Department (HOSPITAL_COMMUNITY)

## 2024-06-15 ENCOUNTER — Other Ambulatory Visit: Payer: Self-pay

## 2024-06-15 ENCOUNTER — Inpatient Hospital Stay (HOSPITAL_COMMUNITY)
Admission: EM | Admit: 2024-06-15 | Discharge: 2024-06-19 | DRG: 481 | Disposition: A | Discharge status: Skilled Nursing Facility | Source: Skilled Nursing Facility | Attending: Family Medicine | Admitting: Family Medicine

## 2024-06-15 DIAGNOSIS — Z79899 Other long term (current) drug therapy: Secondary | ICD-10-CM | POA: Diagnosis not present

## 2024-06-15 DIAGNOSIS — Z7983 Long term (current) use of bisphosphonates: Secondary | ICD-10-CM

## 2024-06-15 DIAGNOSIS — D696 Thrombocytopenia, unspecified: Secondary | ICD-10-CM | POA: Diagnosis present

## 2024-06-15 DIAGNOSIS — S72402A Unspecified fracture of lower end of left femur, initial encounter for closed fracture: Secondary | ICD-10-CM | POA: Diagnosis present

## 2024-06-15 DIAGNOSIS — M9712XA Periprosthetic fracture around internal prosthetic left knee joint, initial encounter: Secondary | ICD-10-CM | POA: Diagnosis present

## 2024-06-15 DIAGNOSIS — I10 Essential (primary) hypertension: Secondary | ICD-10-CM | POA: Diagnosis not present

## 2024-06-15 DIAGNOSIS — K219 Gastro-esophageal reflux disease without esophagitis: Secondary | ICD-10-CM | POA: Diagnosis present

## 2024-06-15 DIAGNOSIS — E0590 Thyrotoxicosis, unspecified without thyrotoxic crisis or storm: Secondary | ICD-10-CM | POA: Diagnosis present

## 2024-06-15 DIAGNOSIS — G4733 Obstructive sleep apnea (adult) (pediatric): Secondary | ICD-10-CM | POA: Diagnosis present

## 2024-06-15 DIAGNOSIS — E039 Hypothyroidism, unspecified: Secondary | ICD-10-CM | POA: Diagnosis present

## 2024-06-15 DIAGNOSIS — F02C4 Dementia in other diseases classified elsewhere, severe, with anxiety: Secondary | ICD-10-CM | POA: Diagnosis present

## 2024-06-15 DIAGNOSIS — Z993 Dependence on wheelchair: Secondary | ICD-10-CM

## 2024-06-15 DIAGNOSIS — G300 Alzheimer's disease with early onset: Secondary | ICD-10-CM | POA: Diagnosis not present

## 2024-06-15 DIAGNOSIS — Z85828 Personal history of other malignant neoplasm of skin: Secondary | ICD-10-CM

## 2024-06-15 DIAGNOSIS — N1831 Chronic kidney disease, stage 3a: Secondary | ICD-10-CM | POA: Diagnosis present

## 2024-06-15 DIAGNOSIS — W19XXXA Unspecified fall, initial encounter: Secondary | ICD-10-CM | POA: Diagnosis present

## 2024-06-15 DIAGNOSIS — F0280 Dementia in other diseases classified elsewhere without behavioral disturbance: Secondary | ICD-10-CM

## 2024-06-15 DIAGNOSIS — I129 Hypertensive chronic kidney disease with stage 1 through stage 4 chronic kidney disease, or unspecified chronic kidney disease: Secondary | ICD-10-CM | POA: Diagnosis present

## 2024-06-15 DIAGNOSIS — F319 Bipolar disorder, unspecified: Secondary | ICD-10-CM | POA: Diagnosis present

## 2024-06-15 DIAGNOSIS — Z66 Do not resuscitate: Secondary | ICD-10-CM | POA: Diagnosis present

## 2024-06-15 DIAGNOSIS — R1310 Dysphagia, unspecified: Secondary | ICD-10-CM | POA: Diagnosis present

## 2024-06-15 DIAGNOSIS — N179 Acute kidney failure, unspecified: Secondary | ICD-10-CM | POA: Diagnosis present

## 2024-06-15 DIAGNOSIS — D62 Acute posthemorrhagic anemia: Secondary | ICD-10-CM | POA: Diagnosis present

## 2024-06-15 DIAGNOSIS — S72452A Displaced supracondylar fracture without intracondylar extension of lower end of left femur, initial encounter for closed fracture: Secondary | ICD-10-CM | POA: Diagnosis present

## 2024-06-15 DIAGNOSIS — M9702XA Periprosthetic fracture around internal prosthetic left hip joint, initial encounter: Secondary | ICD-10-CM | POA: Diagnosis not present

## 2024-06-15 DIAGNOSIS — Y92129 Unspecified place in nursing home as the place of occurrence of the external cause: Secondary | ICD-10-CM

## 2024-06-15 LAB — CBC WITH DIFFERENTIAL/PLATELET
Abs Immature Granulocytes: 0.02 K/uL (ref 0.00–0.07)
Basophils Absolute: 0 K/uL (ref 0.0–0.1)
Basophils Relative: 0 %
Eosinophils Absolute: 0 K/uL (ref 0.0–0.5)
Eosinophils Relative: 1 %
HCT: 26.5 % — ABNORMAL LOW (ref 36.0–46.0)
Hemoglobin: 8.9 g/dL — ABNORMAL LOW (ref 12.0–15.0)
Immature Granulocytes: 0 %
Lymphocytes Relative: 20 %
Lymphs Abs: 1.6 K/uL (ref 0.7–4.0)
MCH: 33.5 pg (ref 26.0–34.0)
MCHC: 33.6 g/dL (ref 30.0–36.0)
MCV: 99.6 fL (ref 80.0–100.0)
Monocytes Absolute: 1.1 K/uL — ABNORMAL HIGH (ref 0.1–1.0)
Monocytes Relative: 13 %
Neutro Abs: 5.5 K/uL (ref 1.7–7.7)
Neutrophils Relative %: 66 %
Platelets: 174 K/uL (ref 150–400)
RBC: 2.66 MIL/uL — ABNORMAL LOW (ref 3.87–5.11)
RDW: 13.9 % (ref 11.5–15.5)
WBC: 8.2 K/uL (ref 4.0–10.5)
nRBC: 0 % (ref 0.0–0.2)

## 2024-06-15 LAB — BASIC METABOLIC PANEL WITH GFR
Anion gap: 11 (ref 5–15)
BUN: 36 mg/dL — ABNORMAL HIGH (ref 8–23)
CO2: 26 mmol/L (ref 22–32)
Calcium: 9 mg/dL (ref 8.9–10.3)
Chloride: 105 mmol/L (ref 98–111)
Creatinine, Ser: 1.08 mg/dL — ABNORMAL HIGH (ref 0.44–1.00)
GFR, Estimated: 46 mL/min — ABNORMAL LOW (ref >60)
Glucose, Bld: 113 mg/dL — ABNORMAL HIGH (ref 70–99)
Potassium: 4.2 mmol/L (ref 3.5–5.1)
Sodium: 142 mmol/L (ref 135–145)

## 2024-06-15 LAB — PROTIME-INR
INR: 1.1 (ref 0.8–1.2)
Prothrombin Time: 14.9 s (ref 11.4–15.2)

## 2024-06-15 LAB — APTT: aPTT: 34 s (ref 24–36)

## 2024-06-15 MED ORDER — CEFAZOLIN SODIUM-DEXTROSE 2-4 GM/100ML-% IV SOLN
2.0000 g | INTRAVENOUS | Status: AC
  Administered 2024-06-16: 2 g via INTRAVENOUS

## 2024-06-15 MED ORDER — LACTATED RINGERS IV BOLUS
500.0000 mL | Freq: Once | INTRAVENOUS | Status: AC
  Administered 2024-06-16: 500 mL via INTRAVENOUS

## 2024-06-15 MED ORDER — SODIUM CHLORIDE 0.9 % IV BOLUS
1000.0000 mL | Freq: Once | INTRAVENOUS | Status: AC
  Administered 2024-06-15: 1000 mL via INTRAVENOUS

## 2024-06-15 MED ORDER — FENTANYL CITRATE (PF) 100 MCG/2ML IJ SOLN
50.0000 ug | Freq: Once | INTRAMUSCULAR | Status: AC
  Administered 2024-06-15: 50 ug via INTRAVENOUS

## 2024-06-15 MED ORDER — POVIDONE-IODINE 10 % EX SWAB: 2.0000 | Freq: Once | CUTANEOUS | Status: DC

## 2024-06-15 MED ORDER — FENTANYL CITRATE (PF) 100 MCG/2ML IJ SOLN
25.0000 ug | Freq: Once | INTRAMUSCULAR | Status: DC
  Filled 2024-06-15: qty 2

## 2024-06-15 MED ORDER — CHLORHEXIDINE GLUCONATE 4 % EX SOLN
60.0000 mL | Freq: Once | CUTANEOUS | Status: DC
  Filled 2024-06-15: qty 60

## 2024-06-15 NOTE — ED Triage Notes (Signed)
 Pt presents to ED via [facility]. EMS called out to facility due to previous fall from Tuesday. Family wanted pt to be assessed after having Tuesdays that show Fracture of L distal femur. Pt disoriented upon assessment but pt has history of alzheimer's. Pt responds to verbal stimuli but otherwise resting with eyes closed. EMS states pt presented this way through transport. Visualized swelling and discoloration of L knee.

## 2024-06-15 NOTE — Progress Notes (Signed)
 Consult request received for displaced left distal femur fracture. Have not been informed of any deficits but have specifically inquired re NV examination from transferring provider and am awaiting confirmation of no deficits given amount of displacement.  Have discussed with the requesting MD patient's stability, pain control, and ongoing work up. I have reviewed x-rays and developed a provisional plan for ORIF tomorrow if safe and hemodynamically stable for transport. Keep NPO.  Full consultation to follow in the am.  Ozell Bruch, MD [facility], [facility] [phone]

## 2024-06-15 NOTE — ED Provider Notes (Signed)
 [place] EMERGENCY DEPARTMENT AT [facility] Provider Note   CSN: [id number] Arrival date & time: 06/15/24  1645     Patient presents with: Fall   Tracy Hayes is a 89 y.o. female.  She has a history of dementia and is noncontributory on HPI.  She resides at a nursing home and has a history of Alzheimer's.  Reportedly had a fall yesterday and had x-rays today showing a fracture was transported to the ER.  No report of head injury  I spoke with her son Joshua, he states that he was notified yesterday that she had fallen but they did not think she had a fracture, then notified today that they took x-rays and she did have a fracture.  He states at baseline she is mostly in a wheelchair because she is a significant fall risk.   Fall       Prior to Admission medications   Medication Sig Start Date End Date Taking? Authorizing Provider  acetaminophen (TYLENOL) 325 MG tablet Take 650 mg by mouth every 6 (six) hours as needed.    [provider]  alendronate (FOSAMAX) 70 MG tablet Take 70 mg by mouth once a week. Take with a full glass of water on an empty stomach. Takes on Saturday    [provider]  Calcium Carb-Cholecalciferol (OYSTER SHELL CALCIUM) 500-400 MG-UNIT TABS Take 1 tablet by mouth 2 (two) times daily.    [provider]  fluticasone (FLONASE) 50 MCG/ACT nasal spray Place 1 spray into both nostrils daily.    [provider]  loperamide (IMODIUM A-D) 2 MG tablet Take 2 mg by mouth 4 (four) times daily as needed for diarrhea or loose stools.    [provider]  LORazepam (ATIVAN) 0.5 MG tablet Take 0.5 mg by mouth 2 (two) times daily.     [provider]  losartan-hydrochlorothiazide (HYZAAR) 100-12.5 MG per tablet Take 1 tablet by mouth daily.    [provider]  predniSONE (DELTASONE) 50 MG tablet Take 50 mg by mouth daily with breakfast.    [provider]  ranitidine (ZANTAC) 150 MG tablet  Take 150 mg by mouth 2 (two) times daily.    [provider]  sertraline (ZOLOFT) 50 MG tablet Take 50 mg by mouth 2 (two) times daily.    [provider]    Allergies: Ace inhibitors, Aspirin, Benadryl [diphenhydramine hcl], Codeine, Peanut-containing drug products, Penicillins, Phenobarbital, Sulfa antibiotics, Vitamin b12, and Caffeine    Review of Systems  Updated Vital Signs BP (!) 141/118   Pulse 94   Temp 98.1 F (36.7 C) (Oral)   Resp 17   Ht 5' 7 (1.702 m)   Wt 52.5 kg   SpO2 92%   BMI 18.13 kg/m   Physical Exam Vitals and nursing note reviewed.  Constitutional:      General: She is not in acute distress.    Appearance: She is well-developed.  HENT:     Head: Normocephalic and atraumatic.     Mouth/Throat:     Mouth: Mucous membranes are moist.  Eyes:     Extraocular Movements: Extraocular movements intact.     Conjunctiva/sclera: Conjunctivae normal.     Pupils: Pupils are equal, round, and reactive to light.  Cardiovascular:     Rate and Rhythm: Normal rate and regular rhythm.     Heart sounds: No murmur heard. Pulmonary:     Effort: Pulmonary effort is normal. No respiratory distress.  Breath sounds: Normal breath sounds.  Abdominal:     Palpations: Abdomen is soft.     Tenderness: There is no abdominal tenderness.  Musculoskeletal:        General: No swelling.     Cervical back: Neck supple.     Comments: deformity to left distal femur.  DP and PT pulses intact.  In the left foot on multiple checks both pre and post reduction.  Patient foot able toes and can dorsiflex and plantarflex the left ankle.  Sensation to light touch is grossly intact  Skin:    General: Skin is warm and dry.     Capillary Refill: Capillary refill takes less than 2 seconds.  Neurological:     General: No focal deficit present.     Mental Status: She is alert. Mental status is at baseline.  Psychiatric:        Mood and Affect: Mood normal.     (all labs  ordered are listed, but only abnormal results are displayed) Labs Reviewed  CBC WITH DIFFERENTIAL/PLATELET - Abnormal; Notable for the following components:      Result Value   RBC 2.66 (*)    Hemoglobin 8.9 (*)    HCT 26.5 (*)    Monocytes Absolute 1.1 (*)    All other components within normal limits  BASIC METABOLIC PANEL WITH GFR - Abnormal; Notable for the following components:   Glucose, Bld 113 (*)    BUN 36 (*)    Creatinine, Ser 1.08 (*)    GFR, Estimated 46 (*)    All other components within normal limits    EKG: None  Radiology: DG Femur Min 2 Views Left Result Date: 06/15/2024 CLINICAL DATA:  History of femur fracture EXAM: LEFT FEMUR 2 VIEWS COMPARISON:  07/21/2023 report, pelvis radiograph 08/26/2023 FINDINGS: Left femoral head projects in joint. Knee replacement. Acute fracture just above the femoral prosthesis with greater than 1 shaft diameter posterior displacement of distal fracture fragment and marked apex anterior angulation at the fracture site. Generalized soft tissue edema IMPRESSION: Acute displaced and angulated fracture just above the femoral component of the knee replacement Electronically Signed   By: Luke Bun M.D.   On: 06/15/2024 18:53     .Reduction of fracture  Date/Time: 06/15/2024 10:22 PM  Performed by: Suellen Sherran LABOR, PA-C Authorized by: Suellen Sherran LABOR, PA-C  Consent: Verbal consent obtained Consent given by: Son Tracy Hayes. Patient understanding: patient states understanding of the procedure being performed Patient identity confirmed: verbally with patient  Sedation: Patient sedated: no  Patient tolerance: patient tolerated the procedure well with no immediate complications Comments: Attempted reduction with traction to straighten leg and put in knee immobilizer pending likely surgery      Medications Ordered in the ED  fentaNYL  (SUBLIMAZE ) injection 25 mcg (has no administration in time range)  sodium chloride  0.9 %  bolus 1,000 mL (1,000 mLs Intravenous New Bag/Given 06/15/24 1800)                                    Medical Decision Making Differential diagnosis includes but limited to fracture, dislocation, sprain, contusion, other  Reviewed patient's prior notes, labs in the EMR  ED course: Patient has history of dementia complaining of left knee pain after reported fall at the nursing home yesterday.  At baseline, per her son she is mostly in the wheelchair as she is a severe fall  risk.  No reported head injury or loss of consciousness but yesterday was doing okay today was having more pain so x-ray was ordered and there was a fracture so she was sent to the ED for further evaluation  On exam she has bruising and swelling to the left distal thigh.  The leg is overall warm and well-perfused with intact pulses in the left foot that were marked.  X-ray showed a severely angulated and displaced left distal femur fracture.  I consulted Dr. Celena the on-call orthopedic surgeon who recommended attempting to straighten her leg and putting in a knee immobilizer, I spoke with the son who discussed with the family and they are agreeable with surgery.  I spoke with Dr. Celena again who had reviewed the x-rays, he would like her transferred to [facility], kept n.p.o. after midnight plan for operative repair.  As noted in procedure section I did straighten patient's leg provided some traction to attempt to reduce this and provide some pain relief, patient was given 50 mg of fentanyl  prior to this and tolerated this well.  With his knee immobilizer, minimal improvement on the x-ray, after this patient still has good pulses, cap refill, can wiggle toes, flex and extend her ankle and states she can feel me touching her.  Her exam is somewhat limited due to her dementia but she does follow commands.  Consulted Dr. Charlton from [facility] hospitalist for hospital admission to [facility] and he is agreeable with this.  Amount and/or  Complexity of Data Reviewed Independent Historian:     Details: Son Tracy Hayes External Data Reviewed: labs and notes. Labs: ordered.    Details: CBC with white blood cell count 8.2, hemoglobin 8.9, BUN 36, creatinine 1.08 Radiology: ordered and independent interpretation performed.    Details: Tray left femur shows displaced angulated left distal femur fracture, agree with radiology read.  Postreduction film shows minimal improvement.  Risk Prescription drug management. Decision regarding hospitalization.        Final diagnoses:  None    ED Discharge Orders     None          Suellen Sherran LABOR, PA-C 06/15/24 2336    Elnor Jayson LABOR, DO 06/16/24 [phone]

## 2024-06-15 NOTE — ED Notes (Signed)
 Bed alarm was placed under patient.

## 2024-06-16 ENCOUNTER — Inpatient Hospital Stay (HOSPITAL_COMMUNITY)

## 2024-06-16 ENCOUNTER — Encounter (HOSPITAL_COMMUNITY): Payer: Self-pay | Admitting: Family Medicine

## 2024-06-16 ENCOUNTER — Other Ambulatory Visit: Payer: Self-pay

## 2024-06-16 ENCOUNTER — Encounter (HOSPITAL_COMMUNITY)
Admission: EM | Disposition: A | Discharge status: Skilled Nursing Facility | Payer: Self-pay | Source: Skilled Nursing Facility | Attending: Family Medicine

## 2024-06-16 ENCOUNTER — Inpatient Hospital Stay (HOSPITAL_COMMUNITY): Admitting: Anesthesiology

## 2024-06-16 DIAGNOSIS — G300 Alzheimer's disease with early onset: Secondary | ICD-10-CM

## 2024-06-16 DIAGNOSIS — E0590 Thyrotoxicosis, unspecified without thyrotoxic crisis or storm: Secondary | ICD-10-CM | POA: Diagnosis not present

## 2024-06-16 DIAGNOSIS — S72402A Unspecified fracture of lower end of left femur, initial encounter for closed fracture: Secondary | ICD-10-CM

## 2024-06-16 DIAGNOSIS — I10 Essential (primary) hypertension: Secondary | ICD-10-CM

## 2024-06-16 DIAGNOSIS — G4733 Obstructive sleep apnea (adult) (pediatric): Secondary | ICD-10-CM

## 2024-06-16 DIAGNOSIS — N1831 Chronic kidney disease, stage 3a: Secondary | ICD-10-CM | POA: Diagnosis present

## 2024-06-16 DIAGNOSIS — S72452A Displaced supracondylar fracture without intracondylar extension of lower end of left femur, initial encounter for closed fracture: Secondary | ICD-10-CM

## 2024-06-16 DIAGNOSIS — I129 Hypertensive chronic kidney disease with stage 1 through stage 4 chronic kidney disease, or unspecified chronic kidney disease: Secondary | ICD-10-CM

## 2024-06-16 DIAGNOSIS — M9702XA Periprosthetic fracture around internal prosthetic left hip joint, initial encounter: Secondary | ICD-10-CM

## 2024-06-16 DIAGNOSIS — F0280 Dementia in other diseases classified elsewhere without behavioral disturbance: Secondary | ICD-10-CM

## 2024-06-16 HISTORY — PX: ORIF FEMUR FRACTURE: SHX2119

## 2024-06-16 LAB — CBC WITH DIFFERENTIAL/PLATELET
Abs Immature Granulocytes: 0.03 K/uL (ref 0.00–0.07)
Basophils Absolute: 0 K/uL (ref 0.0–0.1)
Basophils Relative: 0 %
Eosinophils Absolute: 0.1 K/uL (ref 0.0–0.5)
Eosinophils Relative: 1 %
HCT: 23.9 % — ABNORMAL LOW (ref 36.0–46.0)
Hemoglobin: 7.8 g/dL — ABNORMAL LOW (ref 12.0–15.0)
Immature Granulocytes: 0 %
Lymphocytes Relative: 20 %
Lymphs Abs: 1.6 K/uL (ref 0.7–4.0)
MCH: 32.9 pg (ref 26.0–34.0)
MCHC: 32.6 g/dL (ref 30.0–36.0)
MCV: 100.8 fL — ABNORMAL HIGH (ref 80.0–100.0)
Monocytes Absolute: 1 K/uL (ref 0.1–1.0)
Monocytes Relative: 12 %
Neutro Abs: 5.3 K/uL (ref 1.7–7.7)
Neutrophils Relative %: 67 %
Platelets: 150 K/uL (ref 150–400)
RBC: 2.37 MIL/uL — ABNORMAL LOW (ref 3.87–5.11)
RDW: 14 % (ref 11.5–15.5)
WBC: 8 K/uL (ref 4.0–10.5)
nRBC: 0 % (ref 0.0–0.2)

## 2024-06-16 LAB — PREPARE RBC (CROSSMATCH)

## 2024-06-16 LAB — BASIC METABOLIC PANEL WITH GFR
Anion gap: 9 (ref 5–15)
BUN: 29 mg/dL — ABNORMAL HIGH (ref 8–23)
CO2: 25 mmol/L (ref 22–32)
Calcium: 8.3 mg/dL — ABNORMAL LOW (ref 8.9–10.3)
Chloride: 108 mmol/L (ref 98–111)
Creatinine, Ser: 0.86 mg/dL (ref 0.44–1.00)
GFR, Estimated: >60 mL/min (ref >60)
Glucose, Bld: 108 mg/dL — ABNORMAL HIGH (ref 70–99)
Potassium: 3.9 mmol/L (ref 3.5–5.1)
Sodium: 142 mmol/L (ref 135–145)

## 2024-06-16 LAB — ABO/RH: ABO/RH(D): AB POS

## 2024-06-16 LAB — SURGICAL PCR SCREEN
MRSA, PCR: NEGATIVE
Staphylococcus aureus: NEGATIVE

## 2024-06-16 LAB — VITAMIN D 25 HYDROXY (VIT D DEFICIENCY, FRACTURES): Vit D, 25-Hydroxy: 34.4 ng/mL (ref 30–100)

## 2024-06-16 SURGERY — OPEN REDUCTION INTERNAL FIXATION HIP
Anesthesia: General | Site: Hip | Laterality: Left

## 2024-06-16 SURGERY — OPEN REDUCTION INTERNAL FIXATION (ORIF) DISTAL FEMUR FRACTURE
Anesthesia: General | Site: Leg Upper | Laterality: Left

## 2024-06-16 MED ORDER — EPHEDRINE SULFATE-NACL 50-0.9 MG/10ML-% IV SOSY
PREFILLED_SYRINGE | INTRAVENOUS | Status: DC | PRN
  Administered 2024-06-16: 5 mg via INTRAVENOUS

## 2024-06-16 MED ORDER — FAMOTIDINE 20 MG PO TABS
20.0000 mg | ORAL_TABLET | Freq: Every day | ORAL | Status: DC
  Administered 2024-06-17 – 2024-06-19 (×3): 20 mg via ORAL
  Filled 2024-06-16 (×3): qty 1

## 2024-06-16 MED ORDER — ENSURE PLUS HIGH PROTEIN PO LIQD
237.0000 mL | Freq: Three times a day (TID) | ORAL | Status: DC
  Administered 2024-06-16 – 2024-06-19 (×6): 237 mL via ORAL

## 2024-06-16 MED ORDER — MIDAZOLAM HCL 2 MG/2ML IJ SOLN: 0.5000 mg | Freq: Once | INTRAMUSCULAR | Status: DC | PRN

## 2024-06-16 MED ORDER — DEXAMETHASONE SODIUM PHOSPHATE 10 MG/ML IJ SOLN
INTRAMUSCULAR | Status: DC | PRN
  Administered 2024-06-16: 5 mg via INTRAVENOUS

## 2024-06-16 MED ORDER — METHOCARBAMOL 1000 MG/10ML IJ SOLN: 500.0000 mg | Freq: Four times a day (QID) | INTRAMUSCULAR | Status: DC | PRN

## 2024-06-16 MED ORDER — FENTANYL CITRATE (PF) 100 MCG/2ML IJ SOLN
12.5000 ug | INTRAMUSCULAR | Status: DC | PRN
  Administered 2024-06-16: 12.5 ug via INTRAVENOUS
  Filled 2024-06-16: qty 2

## 2024-06-16 MED ORDER — ROCURONIUM BROMIDE 10 MG/ML (PF) SYRINGE
PREFILLED_SYRINGE | INTRAVENOUS | Status: DC | PRN
  Administered 2024-06-16: 15 mg via INTRAVENOUS
  Administered 2024-06-16: 20 mg via INTRAVENOUS
  Administered 2024-06-16: 50 mg via INTRAVENOUS

## 2024-06-16 MED ORDER — SUGAMMADEX SODIUM 200 MG/2ML IV SOLN
INTRAVENOUS | Status: AC
  Filled 2024-06-16: qty 2

## 2024-06-16 MED ORDER — TRANEXAMIC ACID-NACL 1000-0.7 MG/100ML-% IV SOLN
INTRAVENOUS | Status: DC | PRN
  Administered 2024-06-16: 1000 mg via INTRAVENOUS

## 2024-06-16 MED ORDER — SODIUM CHLORIDE 0.9 % IV SOLN: INTRAVENOUS | Status: DC | PRN

## 2024-06-16 MED ORDER — SODIUM CHLORIDE 0.9% IV SOLUTION: Freq: Once | INTRAVENOUS | Status: DC

## 2024-06-16 MED ORDER — 0.9 % SODIUM CHLORIDE (POUR BTL) OPTIME
TOPICAL | Status: DC | PRN
Start: 2024-06-16 — End: 2024-06-16
  Administered 2024-06-16: 1000 mL

## 2024-06-16 MED ORDER — SODIUM CHLORIDE 0.9 % IV SOLN: INTRAVENOUS | Status: DC

## 2024-06-16 MED ORDER — PHENYLEPHRINE HCL-NACL 20-0.9 MG/250ML-% IV SOLN
INTRAVENOUS | Status: DC | PRN
  Administered 2024-06-16: 50 ug/min via INTRAVENOUS

## 2024-06-16 MED ORDER — LIDOCAINE 2% (20 MG/ML) 5 ML SYRINGE
INTRAMUSCULAR | Status: AC
  Filled 2024-06-16: qty 5

## 2024-06-16 MED ORDER — ADULT MULTIVITAMIN W/MINERALS CH
1.0000 | ORAL_TABLET | Freq: Every day | ORAL | Status: DC
  Administered 2024-06-17 – 2024-06-19 (×3): 1 via ORAL
  Filled 2024-06-16 (×3): qty 1

## 2024-06-16 MED ORDER — SUGAMMADEX SODIUM 200 MG/2ML IV SOLN
INTRAVENOUS | Status: DC | PRN
  Administered 2024-06-16: 200 mg via INTRAVENOUS

## 2024-06-16 MED ORDER — OXYCODONE HCL 5 MG PO TABS
2.5000 mg | ORAL_TABLET | ORAL | Status: DC | PRN
  Administered 2024-06-16: 5 mg via ORAL
  Filled 2024-06-16: qty 1

## 2024-06-16 MED ORDER — FENTANYL CITRATE (PF) 100 MCG/2ML IJ SOLN: 25.0000 ug | INTRAMUSCULAR | Status: DC | PRN

## 2024-06-16 MED ORDER — CEFAZOLIN SODIUM-DEXTROSE 2-4 GM/100ML-% IV SOLN
2.0000 g | Freq: Three times a day (TID) | INTRAVENOUS | Status: AC
  Administered 2024-06-16 – 2024-06-17 (×3): 2 g via INTRAVENOUS
  Filled 2024-06-16 (×3): qty 100

## 2024-06-16 MED ORDER — LACTATED RINGERS IV SOLN: INTRAVENOUS | Status: DC

## 2024-06-16 MED ORDER — METHOCARBAMOL 500 MG PO TABS
500.0000 mg | ORAL_TABLET | Freq: Four times a day (QID) | ORAL | Status: DC | PRN
  Administered 2024-06-19: 500 mg via ORAL
  Filled 2024-06-16: qty 1

## 2024-06-16 MED ORDER — FENTANYL CITRATE (PF) 250 MCG/5ML IJ SOLN
INTRAMUSCULAR | Status: AC
Start: 2024-06-16 — End: 2024-06-16
  Filled 2024-06-16: qty 5

## 2024-06-16 MED ORDER — FENTANYL CITRATE (PF) 250 MCG/5ML IJ SOLN
INTRAMUSCULAR | Status: DC | PRN
  Administered 2024-06-16: 25 ug via INTRAVENOUS
  Administered 2024-06-16: 100 ug via INTRAVENOUS

## 2024-06-16 MED ORDER — BUSPIRONE HCL 5 MG PO TABS
2.5000 mg | ORAL_TABLET | Freq: Every day | ORAL | Status: DC
  Administered 2024-06-17 – 2024-06-19 (×3): 2.5 mg via ORAL
  Filled 2024-06-16 (×3): qty 1

## 2024-06-16 MED ORDER — ACETAMINOPHEN 10 MG/ML IV SOLN
INTRAVENOUS | Status: DC | PRN
Start: 2024-06-16 — End: 2024-06-16
  Administered 2024-06-16: 1000 mg via INTRAVENOUS

## 2024-06-16 MED ORDER — LIDOCAINE 2% (20 MG/ML) 5 ML SYRINGE
INTRAMUSCULAR | Status: DC | PRN
  Administered 2024-06-16: 25 mg via INTRAVENOUS

## 2024-06-16 MED ORDER — ONDANSETRON HCL 4 MG/2ML IJ SOLN
INTRAMUSCULAR | Status: AC
  Filled 2024-06-16: qty 2

## 2024-06-16 MED ORDER — ORAL CARE MOUTH RINSE: 15.0000 mL | Freq: Once | OROMUCOSAL | Status: AC

## 2024-06-16 MED ORDER — PROCHLORPERAZINE EDISYLATE 10 MG/2ML IJ SOLN: 5.0000 mg | Freq: Four times a day (QID) | INTRAMUSCULAR | Status: DC | PRN

## 2024-06-16 MED ORDER — PROPOFOL 10 MG/ML IV BOLUS
INTRAVENOUS | Status: DC | PRN
  Administered 2024-06-16: 70 mg via INTRAVENOUS

## 2024-06-16 MED ORDER — ROCURONIUM BROMIDE 10 MG/ML (PF) SYRINGE
PREFILLED_SYRINGE | INTRAVENOUS | Status: AC
  Filled 2024-06-16: qty 10

## 2024-06-16 MED ORDER — CHLORHEXIDINE GLUCONATE 0.12 % MT SOLN
15.0000 mL | Freq: Once | OROMUCOSAL | Status: AC
  Administered 2024-06-16: 15 mL via OROMUCOSAL

## 2024-06-16 MED ORDER — METHIMAZOLE 5 MG PO TABS
5.0000 mg | ORAL_TABLET | Freq: Every day | ORAL | Status: DC
  Administered 2024-06-17 – 2024-06-19 (×3): 5 mg via ORAL
  Filled 2024-06-16 (×3): qty 1

## 2024-06-16 MED ORDER — ONDANSETRON HCL 4 MG/2ML IJ SOLN
INTRAMUSCULAR | Status: DC | PRN
  Administered 2024-06-16: 4 mg via INTRAVENOUS

## 2024-06-16 MED ORDER — DEXAMETHASONE SODIUM PHOSPHATE 10 MG/ML IJ SOLN
INTRAMUSCULAR | Status: AC
  Filled 2024-06-16: qty 1

## 2024-06-16 MED ORDER — ACETAMINOPHEN 325 MG PO TABS: 650.0000 mg | ORAL_TABLET | Freq: Four times a day (QID) | ORAL | Status: DC | PRN

## 2024-06-16 MED ORDER — EPHEDRINE 5 MG/ML INJ
INTRAVENOUS | Status: AC
  Filled 2024-06-16: qty 5

## 2024-06-16 SURGICAL SUPPLY — 61 items
BAG COUNTER SPONGE SURGICOUNT (BAG) ×2 IMPLANT
BIT DRILL QC LG EVOS 3.7 SN (DRILL) IMPLANT
BLADE CLIPPER SURG (BLADE) IMPLANT
BNDG ELASTIC 4X5.8 VLCR STR LF (GAUZE/BANDAGES/DRESSINGS) ×2 IMPLANT
BNDG ELASTIC 6INX 5YD STR LF (GAUZE/BANDAGES/DRESSINGS) ×2 IMPLANT
BNDG GAUZE DERMACEA FLUFF 4 (GAUZE/BANDAGES/DRESSINGS) ×2 IMPLANT
BRUSH SCRUB EZ PLAIN DRY (MISCELLANEOUS) ×4 IMPLANT
CANISTER SUCTION 3000ML PPV (SUCTIONS) ×2 IMPLANT
COVER SURGICAL LIGHT HANDLE (MISCELLANEOUS) ×2 IMPLANT
DRAPE C-ARM 42X72 X-RAY (DRAPES) ×2 IMPLANT
DRAPE C-ARMOR (DRAPES) ×2 IMPLANT
DRAPE IMP U-DRAPE 54X76 (DRAPES) ×2 IMPLANT
DRAPE SURG ORHT 6 SPLT 77X108 (DRAPES) ×6 IMPLANT
DRAPE U-SHAPE 47X51 STRL (DRAPES) ×2 IMPLANT
DRSG ADAPTIC 3X8 NADH LF (GAUZE/BANDAGES/DRESSINGS) ×2 IMPLANT
DRSG MEPILEX POST OP 4X12 (GAUZE/BANDAGES/DRESSINGS) IMPLANT
ELECTRODE REM PT RTRN 9FT ADLT (ELECTROSURGICAL) ×2 IMPLANT
EVACUATOR 1/8 PVC DRAIN (DRAIN) IMPLANT
EVACUATOR 3/16 PVC DRAIN (DRAIN) IMPLANT
GAUZE PAD ABD 8X10 STRL (GAUZE/BANDAGES/DRESSINGS) ×8 IMPLANT
GAUZE SPONGE 4X4 12PLY STRL (GAUZE/BANDAGES/DRESSINGS) ×2 IMPLANT
GLOVE BIO SURGEON STRL SZ7.5 (GLOVE) ×2 IMPLANT
GLOVE BIO SURGEON STRL SZ8 (GLOVE) ×2 IMPLANT
GLOVE BIOGEL PI IND STRL 7.5 (GLOVE) ×2 IMPLANT
GLOVE BIOGEL PI IND STRL 8 (GLOVE) ×2 IMPLANT
GLOVE SURG ORTHO LTX SZ7.5 (GLOVE) ×4 IMPLANT
GOWN STRL REUS W/ TWL LRG LVL3 (GOWN DISPOSABLE) ×4 IMPLANT
GOWN STRL REUS W/ TWL XL LVL3 (GOWN DISPOSABLE) ×2 IMPLANT
KIT BASIN OR (CUSTOM PROCEDURE TRAY) ×2 IMPLANT
KIT TURNOVER KIT B (KITS) ×2 IMPLANT
KWIRE 2.0X255 DRILL TIP (WIRE) IMPLANT
KWIRE FIX TT 2X350 (WIRE) IMPLANT
NDL 22X1.5 STRL (OR ONLY) (MISCELLANEOUS) IMPLANT
NEEDLE 22X1.5 STRL (OR ONLY) (MISCELLANEOUS) IMPLANT
NS IRRIG 1000ML POUR BTL (IV SOLUTION) ×2 IMPLANT
PACK TOTAL JOINT (CUSTOM PROCEDURE TRAY) ×2 IMPLANT
PACK UNIVERSAL I (CUSTOM PROCEDURE TRAY) ×2 IMPLANT
PAD ARMBOARD POSITIONER FOAM (MISCELLANEOUS) ×4 IMPLANT
PAD CAST 4YDX4 CTTN HI CHSV (CAST SUPPLIES) ×2 IMPLANT
PADDING CAST COTTON 6X4 STRL (CAST SUPPLIES) ×2 IMPLANT
PLATE DIST FEM EVOS 197 9H LT (Plate) IMPLANT
SCREW CORT ST EVOS 4.5X30 (Screw) IMPLANT
SCREW CORT ST EVOS 4.5X76 STL (Screw) IMPLANT
SCREW EVOS S-T CTX ST 4.5X34 (Screw) IMPLANT
SCREW FT EVOS 6.7X80 (Screw) IMPLANT
SCREW LOCK EVOS BT 4.5X12 (Screw) IMPLANT
SCREW LOCK ST EVOS 4.5X64 (Screw) IMPLANT
SCREW LOCK ST EVOS 4.5X66 (Screw) IMPLANT
SCREW LOCK ST EVOS 4.5X80 (Screw) IMPLANT
SPONGE T-LAP 18X18 ~~LOC~~+RFID (SPONGE) ×2 IMPLANT
STAPLER SKIN PROX 35W (STAPLE) ×2 IMPLANT
SUCTION TUBE FRAZIER 10FR DISP (SUCTIONS) ×2 IMPLANT
SUT PROLENE 0 CT 2 (SUTURE) IMPLANT
SUT VIC AB 0 CT1 27XBRD ANBCTR (SUTURE) ×4 IMPLANT
SUT VIC AB 1 CT1 27XBRD ANBCTR (SUTURE) ×4 IMPLANT
SUT VIC AB 2-0 CT1 TAPERPNT 27 (SUTURE) ×4 IMPLANT
SYR 20ML ECCENTRIC (SYRINGE) IMPLANT
TOWEL GREEN STERILE (TOWEL DISPOSABLE) ×4 IMPLANT
TOWEL GREEN STERILE FF (TOWEL DISPOSABLE) ×2 IMPLANT
TRAY FOLEY MTR SLVR 16FR STAT (SET/KITS/TRAYS/PACK) IMPLANT
WATER STERILE IRR 1000ML POUR (IV SOLUTION) ×4 IMPLANT

## 2024-06-16 NOTE — ED Notes (Signed)
 Pt has remained npo since midnight

## 2024-06-16 NOTE — Progress Notes (Signed)
   06/16/24 0931  TOC Brief Assessment  Insurance and Status Reviewed  Patient has primary care physician Yes  Home environment has been reviewed [employer] [facility]  Prior level of function: Max assist  Prior/Current Home Services Current home services  Social Drivers of Health Review SDOH reviewed needs interventions  Readmission risk has been reviewed Yes  Transition of care needs transition of care needs identified, TOC will continue to follow   Pt from [facility] memory care unit. She is active c/[facility].

## 2024-06-16 NOTE — Anesthesia Preprocedure Evaluation (Addendum)
 Anesthesia Evaluation  Patient identified by MRN, date of birth, ID band Patient awake and Patient confused    Reviewed: Allergy & Precautions, NPO status , Patient's Chart, lab work & pertinent test results  History of Anesthesia Complications Negative for: history of anesthetic complications  Airway Mallampati: II  TM Distance: >3 FB Neck ROM: Full    Dental  (+) Missing, Dental Advisory Given   Pulmonary sleep apnea    breath sounds clear to auscultation       Cardiovascular hypertension, Pt. on medications  Rhythm:Regular Rate:Normal     Neuro/Psych   Anxiety Depression   Dementia    GI/Hepatic Neg liver ROS,GERD  Medicated,,  Endo/Other   Hyperthyroidism   Renal/GU Renal InsufficiencyRenal disease     Musculoskeletal  (+) Arthritis , steroids,    Abdominal   Peds  Hematology  (+) Blood dyscrasia (Hb 7.8, plt 150k), anemia   Anesthesia Other Findings   Reproductive/Obstetrics                              Anesthesia Physical Anesthesia Plan  ASA: 3  Anesthesia Plan: General   Post-op Pain Management: Minimal or no pain anticipated and Tylenol PO (pre-op)*   Induction: Intravenous  PONV Risk Score and Plan: 3 and Ondansetron , Dexamethasone and Treatment may vary due to age or medical condition  Airway Management Planned: Oral ETT  Additional Equipment: None  Intra-op Plan:   Post-operative Plan: Extubation in OR  Informed Consent: I have reviewed the patients History and Physical, chart, labs and discussed the procedure including the risks, benefits and alternatives for the proposed anesthesia with the patient or authorized representative who has indicated his/her understanding and acceptance.   Patient has DNR.  Discussed DNR with patient.   Consent reviewed with POA and Dental advisory given  Plan Discussed with: CRNA and Surgeon  Anesthesia Plan Comments: (DNR:  no chest compressions or defibrillation, OK to intubate/ventilate periop, OK to treat BP and rhythm with medications Discussed with pt's son, Chelesea Weiand by telephone)         Anesthesia Quick Evaluation

## 2024-06-16 NOTE — Anesthesia Postprocedure Evaluation (Signed)
 Anesthesia Post Note  Patient: Tracy Hayes  Procedure(s) Performed: OPEN REDUCTION INTERNAL FIXATION (ORIF) DISTAL FEMUR FRACTURE (Left: Leg Upper)     Patient location during evaluation: PACU Anesthesia Type: General Level of consciousness: awake and alert and patient uncooperative Pain management: pain level controlled Vital Signs Assessment: post-procedure vital signs reviewed and stable Respiratory status: spontaneous breathing, nonlabored ventilation, respiratory function stable and patient connected to nasal cannula oxygen Cardiovascular status: blood pressure returned to baseline and stable Postop Assessment: no apparent nausea or vomiting Anesthetic complications: no   No notable events documented.  Last Vitals:  Vitals:   06/16/24 1545 06/16/24 1625  BP: (!) 160/81 (!) 105/90  Pulse: 73 72  Resp: 14 20  Temp: 36.4 C 36.4 C  SpO2: 97% 99%    Last Pain:  Vitals:   06/16/24 1625  TempSrc: Axillary  PainSc:                  [name],[name]

## 2024-06-16 NOTE — Consult Note (Addendum)
 Orthopaedic Trauma Service (OTS) Consultation   Patient ID: [name] MRN: [id number] DOB/AGE: [birth year] 89 y.o.   Reason for Consult: right distal femur fracture Referring Physician: Velinda Sprinkles, MD  HPI: Tracy Hayes is an 89 y.o. female with unwitnessed fall and injury to the right distal femur. Patient is demented and largely wheel chair bound. Displacement greater than 100% increases the risks of nonunion and neurovascular injury. Pain appears well controlled now, but can be sharp and severe with motion, without associated distal motor loss, and improved with narcotics.  I have spoken at length with son regarding indications and expectations, operative plan, and postoperative rehabilitation.  Past Medical History:  Diagnosis Date   Alzheimer's disease with early onset (HCC)    Anxiety    Basal cell carcinoma    Cardiac murmur    Chronic constipation    CKD stage 3a, GFR 45-59 ml/min (HCC) 06/16/2024   Depression    Diverticulosis    DJD (degenerative joint disease)    Essential hypertension, benign    History of pneumonia    Hyperthyroidism 06/16/2024   OSA (obstructive sleep apnea)     Past Surgical History:  Procedure Laterality Date   S/P NASAL TIP RESECTION  1996   S/P POLYPECTOMY      Family History  Problem Relation Age of Onset   Stroke Mother    Stroke Father     Social History:  reports that she has never smoked. She has never used smokeless tobacco. She reports that she does not drink alcohol and does not use drugs.  Allergies:  Allergies  Allergen Reactions   Ace Inhibitors Other (See Comments)    Unknown    Aspirin Other (See Comments)    Unknown    Benadryl [Diphenhydramine Hcl] Other (See Comments)    Unknown    Codeine Other (See Comments)    Unknown    Peanut-Containing Drug Products Other (See Comments)    Unknown    Penicillins Other (See Comments)    Unknown    Phenobarbital Other (See Comments)    Unknown     Sulfa Antibiotics Other (See Comments)    Unknown    Vitamin B12 Other (See Comments)    Unknown    Caffeine Palpitations    Medications: Prior to Admission:  Medications Prior to Admission  Medication Sig Dispense Refill Last Dose/Taking   busPIRone (BUSPAR) 5 MG tablet Take 2.5 mg by mouth daily at 6 (six) AM.   Taking   famotidine (PEPCID) 20 MG tablet Take 20 mg by mouth daily.   Taking   methimazole (TAPAZOLE) 5 MG tablet Take 5 mg by mouth daily.   Taking   acetaminophen (TYLENOL) 325 MG tablet Take 650 mg by mouth every 6 (six) hours as needed.      alendronate (FOSAMAX) 70 MG tablet Take 70 mg by mouth once a week. Take with a full glass of water on an empty stomach. Takes on Saturday      Calcium Carb-Cholecalciferol (OYSTER SHELL CALCIUM) 500-400 MG-UNIT TABS Take 1 tablet by mouth 2 (two) times daily.      fluticasone (FLONASE) 50 MCG/ACT nasal spray Place 1 spray into both nostrils daily.      loperamide (IMODIUM A-D) 2 MG tablet Take 2 mg by mouth 4 (four) times daily as needed for diarrhea or loose stools.      LORazepam (ATIVAN) 0.5 MG tablet Take 0.5 mg by mouth 2 (two)  times daily.       losartan-hydrochlorothiazide (HYZAAR) 100-12.5 MG per tablet Take 1 tablet by mouth daily.      predniSONE (DELTASONE) 50 MG tablet Take 50 mg by mouth daily with breakfast.      ranitidine (ZANTAC) 150 MG tablet Take 150 mg by mouth 2 (two) times daily.      sertraline (ZOLOFT) 50 MG tablet Take 50 mg by mouth 2 (two) times daily.       Results for orders placed or performed during the hospital encounter of 06/15/24 (from the past 48 hours)  CBC with Differential     Status: Abnormal   Collection Time: 06/15/24  5:39 PM  Result Value Ref Range   WBC 8.2 4.0 - 10.5 K/uL   RBC 2.66 (L) 3.87 - 5.11 MIL/uL   Hemoglobin 8.9 (L) 12.0 - 15.0 g/dL   HCT 73.4 (L) 63.9 - 53.9 %   MCV 99.6 80.0 - 100.0 fL   MCH 33.5 26.0 - 34.0 pg   MCHC 33.6 30.0 - 36.0 g/dL   RDW 86.0 88.4 - 84.4 %    Platelets 174 150 - 400 K/uL   nRBC 0.0 0.0 - 0.2 %   Neutrophils Relative % 66 %   Neutro Abs 5.5 1.7 - 7.7 K/uL   Lymphocytes Relative 20 %   Lymphs Abs 1.6 0.7 - 4.0 K/uL   Monocytes Relative 13 %   Monocytes Absolute 1.1 (H) 0.1 - 1.0 K/uL   Eosinophils Relative 1 %   Eosinophils Absolute 0.0 0.0 - 0.5 K/uL   Basophils Relative 0 %   Basophils Absolute 0.0 0.0 - 0.1 K/uL   Immature Granulocytes 0 %   Abs Immature Granulocytes 0.02 0.00 - 0.07 K/uL    Comment: Performed at [facility], [address] [place], [place] [address]  Basic metabolic panel     Status: Abnormal   Collection Time: 06/15/24  5:39 PM  Result Value Ref Range   Sodium 142 135 - 145 mmol/L   Potassium 4.2 3.5 - 5.1 mmol/L   Chloride 105 98 - 111 mmol/L   CO2 26 22 - 32 mmol/L   Glucose, Bld 113 (H) 70 - 99 mg/dL    Comment: Glucose reference range applies only to samples taken after fasting for at least 8 hours.   BUN 36 (H) 8 - 23 mg/dL   Creatinine, Ser 8.91 (H) 0.44 - 1.00 mg/dL   Calcium 9.0 8.9 - 89.6 mg/dL   GFR, Estimated 46 (L) >60 mL/min    Comment: (NOTE) Calculated using the CKD-EPI Creatinine Equation (2021)    Anion gap 11 5 - 15    Comment: Performed at [facility], [address], [place], [place] [address]  VITAMIN D 25 Hydroxy (Vit-D Deficiency, Fractures)     Status: None   Collection Time: 06/15/24 10:31 PM  Result Value Ref Range   Vit D, 25-Hydroxy 34.40 30 - 100 ng/mL    Comment: (NOTE) Vitamin D deficiency has been defined by the Institute of Medicine  and an Endocrine Society practice guideline as a level of serum 25-OH  vitamin D less than 20 ng/mL (1,2). The Endocrine Society went on to  further define vitamin D insufficiency as a level between 21 and 29  ng/mL (2).  1. IOM (Institute of Medicine). 2010. Dietary reference intakes for  calcium and D. [place]: [employer]. 2. Holick MF, Binkley [place], Bischoff-Ferrari HA, et al. Evaluation,  treatment,  and prevention  of vitamin D deficiency: an Endocrine  Society clinical practice guideline, JCEM. 2011 Jul; 96(7): 1911-30.  Performed at [facility], [address], [place], [place] [address]   Protime-INR     Status: None   Collection Time: 06/15/24 10:31 PM  Result Value Ref Range   Prothrombin Time 14.9 11.4 - 15.2 seconds   INR 1.1 0.8 - 1.2    Comment: (NOTE) INR goal varies based on device and disease states. Performed at [facility], [address], [place], [place] [address]   APTT     Status: None   Collection Time: 06/15/24 10:31 PM  Result Value Ref Range   aPTT 34 24 - 36 seconds    Comment: Performed at [facility], [address], [place], [place] [address]  Basic metabolic panel with GFR     Status: Abnormal   Collection Time: 06/16/24  4:56 AM  Result Value Ref Range   Sodium 142 135 - 145 mmol/L   Potassium 3.9 3.5 - 5.1 mmol/L   Chloride 108 98 - 111 mmol/L   CO2 25 22 - 32 mmol/L   Glucose, Bld 108 (H) 70 - 99 mg/dL    Comment: Glucose reference range applies only to samples taken after fasting for at least 8 hours.   BUN 29 (H) 8 - 23 mg/dL   Creatinine, Ser 9.13 0.44 - 1.00 mg/dL   Calcium 8.3 (L) 8.9 - 10.3 mg/dL   GFR, Estimated >39 >39 mL/min    Comment: (NOTE) Calculated using the CKD-EPI Creatinine Equation (2021)    Anion gap 9 5 - 15    Comment: Performed at [facility], [address] [place], [place] [address]  CBC with Differential/Platelet     Status: Abnormal   Collection Time: 06/16/24  4:56 AM  Result Value Ref Range   WBC 8.0 4.0 - 10.5 K/uL   RBC 2.37 (L) 3.87 - 5.11 MIL/uL   Hemoglobin 7.8 (L) 12.0 - 15.0 g/dL   HCT 76.0 (L) 63.9 - 53.9 %   MCV 100.8 (H) 80.0 - 100.0 fL   MCH 32.9 26.0 - 34.0 pg   MCHC 32.6 30.0 - 36.0 g/dL   RDW 85.9 88.4 - 84.4 %   Platelets 150 150 - 400 K/uL   nRBC 0.0 0.0 - 0.2 %   Neutrophils Relative % 67 %   Neutro Abs 5.3 1.7 - 7.7 K/uL   Lymphocytes Relative 20 %   Lymphs Abs 1.6 0.7 - 4.0  K/uL   Monocytes Relative 12 %   Monocytes Absolute 1.0 0.1 - 1.0 K/uL   Eosinophils Relative 1 %   Eosinophils Absolute 0.1 0.0 - 0.5 K/uL   Basophils Relative 0 %   Basophils Absolute 0.0 0.0 - 0.1 K/uL   Immature Granulocytes 0 %   Abs Immature Granulocytes 0.03 0.00 - 0.07 K/uL    Comment: Performed at [facility], [address], [place], [place] [address]  ABO/Rh     Status: None (Preliminary result)   Collection Time: 06/16/24 11:05 AM  Result Value Ref Range   ABO/RH(D) PENDING   Type and screen [facility]     Status: None (Preliminary result)   Collection Time: 06/16/24 11:20 AM  Result Value Ref Range   ABO/RH(D) PENDING    Antibody Screen PENDING    Sample Expiration      06/19/2024,2359 Performed at [facility], [address], [place], [place] [address]  Chest Portable 1 View Result Date: 06/16/2024 EXAM: 1 VIEW XRAY OF THE CHEST 06/16/2024 12:33:00 AM COMPARISON: Chest radiograph dated 08/26/2023. CLINICAL HISTORY: Preoperative examination - Closed fracture of left distal femur. FINDINGS: LUNGS AND PLEURA: No focal consolidation, pleural effusion, or pneumothorax. HEART AND MEDIASTINUM: Stable cardiomediastinal silhouette. Aortic atherosclerotic calcification. BONES AND SOFT TISSUES: No acute osseous abnormality. IMPRESSION: 1. No acute cardiopulmonary pathology. Electronically signed by: Norman Gatlin MD 06/16/2024 12:50 AM EDT RP Workstation: [id number]   DG FEMUR MIN 2 VIEWS LEFT Result Date: 06/15/2024 EXAM: 2 VIEW(S) XRAY OF THE LEFT FEMUR 06/15/2024 08:40:00 PM COMPARISON: Same day femur radiographs. CLINICAL HISTORY: 809823 Fall 190176. Post reduction FINDINGS: BONES AND JOINTS: Redemonstrated periprosthetic femur fracture about the femoral component of the TKA. There is decreased but remaining significant apex anterior angulation, posterior displacement of the distal fragment, and foreshortening. SOFT TISSUES: Adjacent soft tissue  swelling. IMPRESSION: 1. Redemonstrated periprosthetic femur fracture about the femoral component of the TKA. There is decreased but remaining significant apex anterior angulation, posterior displacement of the distal fragment, and foreshortening. Electronically signed by: Norman Gatlin MD 06/15/2024 08:47 PM EDT RP Workstation: [id number]   DG Femur Min 2 Views Left Result Date: 06/15/2024 CLINICAL DATA:  History of femur fracture EXAM: LEFT FEMUR 2 VIEWS COMPARISON:  07/21/2023 report, pelvis radiograph 08/26/2023 FINDINGS: Left femoral head projects in joint. Knee replacement. Acute fracture just above the femoral prosthesis with greater than 1 shaft diameter posterior displacement of distal fracture fragment and marked apex anterior angulation at the fracture site. Generalized soft tissue edema IMPRESSION: Acute displaced and angulated fracture just above the femoral component of the knee replacement Electronically Signed   By: Luke Bun M.D.   On: 06/15/2024 18:53    Intake/Output      07/17 0701 07/18 0700 07/18 0701 07/19 0700   P.O.  0   IV Piggyback 1000    Total Intake(mL/kg) 1000 (19) 0 (0)   Net +1000 0           ROS No recent fever, bleeding abnormalities, urologic dysfunction, GI problems, or weight gain. Blood pressure (!) 159/64, pulse 72, temperature 98.1 F (36.7 C), temperature source Oral, resp. rate 18, height 5' 7 (1.702 m), weight 52.5 kg, SpO2 100%. Physical Exam NCAT, pleasant, somnolent RLE Deformity  Edema/ swelling controlled  Sens: DPN, SPN, TN intact  Motor: EHL, FHL, and lessor toe ext and flex all intact grossly but only fleeting demonstration of extension  DP 2+, Brisk cap refill, warm to touch       Gait: could not assess Coordination and balance: could not assess   Assessment/Plan:  Severely displaced right distal femur fracture Dementia Minimal ambulator CKD  The risks and benefits of surgical repair of the severely displaced right  distal femur were discussed with the patient's son, including the possibility of heart attack, stroke, death, infection, nerve injury, vessel injury, wound breakdown, arthritis, symptomatic hardware, DVT/ PE, loss of motion, malunion, nonunion, and need for further surgery among others.  We also specifically discussed the risk of vascular injury without repair.  These risks were acknowledged and consent was provided to proceed.   Weightbearing: WBAT right lower extremity for transfers only Insicional and dressing care: Reinforce dressings as needed Orthopedic device(s): none Showering: yes, please VTE prophylaxis: Lovenox  40mg  qd while in the hospital Pain control: Norco Follow - up plan: 2 weeks Contact information:  Ozell Bruch, MD, [place], PA-C   Ozell Bruch, MD [facility], [facility] [phone]  06/16/2024, 12:13 PM  [facility] [address] [place] [address] [phone] (F)    After 5pm and on the weekends please log on to Amion, go to orthopaedics and the look under the [facility] Call for the provider(s) on call. You can also call our office at [phone] and then follow the prompts to be connected to the call team. ;t

## 2024-06-16 NOTE — Plan of Care (Signed)
  Problem: Education: Goal: Knowledge of General Education information will improve Description: Including pain rating scale, medication(s)/side effects and non-pharmacologic comfort measures 06/16/2024 1916 by Margrette Reine POUR, RN Outcome: Progressing 06/16/2024 1915 by Margrette Reine POUR, RN Outcome: Progressing   Problem: Clinical Measurements: Goal: Ability to maintain clinical measurements within normal limits will improve 06/16/2024 1916 by Margrette Reine POUR, RN Outcome: Progressing 06/16/2024 1915 by Margrette Reine POUR, RN Outcome: Progressing Goal: Will remain free from infection 06/16/2024 1916 by Margrette Reine POUR, RN Outcome: Progressing 06/16/2024 1915 by Margrette Reine POUR, RN Outcome: Progressing Goal: Respiratory complications will improve 06/16/2024 1916 by Margrette Reine POUR, RN Outcome: Progressing 06/16/2024 1915 by Keonta Alsip K, RN Outcome: Progressing Goal: Cardiovascular complication will be avoided 06/16/2024 1916 by Brenda Cowher K, RN Outcome: Progressing 06/16/2024 1915 by Jacksyn Beeks K, RN Outcome: Progressing   Problem: Activity: Goal: Risk for activity intolerance will decrease 06/16/2024 1916 by Margrette Reine POUR, RN Outcome: Progressing 06/16/2024 1915 by Margrette Reine POUR, RN Outcome: Progressing   Problem: Nutrition: Goal: Adequate nutrition will be maintained 06/16/2024 1916 by Margrette Reine POUR, RN Outcome: Progressing 06/16/2024 1915 by Margrette Reine POUR, RN Outcome: Progressing   Problem: Coping: Goal: Level of anxiety will decrease 06/16/2024 1916 by Margrette Reine POUR, RN Outcome: Progressing 06/16/2024 1915 by Margrette Reine POUR, RN Outcome: Progressing   Problem: Elimination: Goal: Will not experience complications related to bowel motility 06/16/2024 1916 by Margrette Reine POUR, RN Outcome: Progressing 06/16/2024 1915 by Margrette Reine POUR, RN Outcome: Progressing Goal: Will not experience complications related to urinary  retention 06/16/2024 1916 by Margrette Reine POUR, RN Outcome: Progressing 06/16/2024 1915 by Margrette Reine POUR, RN Outcome: Progressing   Problem: Pain Managment: Goal: General experience of comfort will improve and/or be controlled 06/16/2024 1916 by Rjay Revolorio K, RN Outcome: Progressing 06/16/2024 1915 by Margrette Reine POUR, RN Outcome: Progressing   Problem: Safety: Goal: Ability to remain free from injury will improve 06/16/2024 1916 by Margrette Reine POUR, RN Outcome: Progressing 06/16/2024 1915 by Margrette Reine POUR, RN Outcome: Progressing   Problem: Skin Integrity: Goal: Risk for impaired skin integrity will decrease 06/16/2024 1916 by Margrette Reine POUR, RN Outcome: Progressing 06/16/2024 1915 by Magaby Rumberger K, RN Outcome: Progressing

## 2024-06-16 NOTE — Transfer of Care (Signed)
 Immediate Anesthesia Transfer of Care Note  Patient: Tracy Hayes  Procedure(s) Performed: OPEN REDUCTION INTERNAL FIXATION (ORIF) DISTAL FEMUR FRACTURE (Left: Leg Upper)  Patient Location: PACU  Anesthesia Type:General  Level of Consciousness: awake and drowsy  Airway & Oxygen Therapy: Patient Spontanous Breathing and Patient connected to face mask oxygen  Post-op Assessment: Report given to RN and Post -op Vital signs reviewed and stable  Post vital signs: Reviewed and stable  Last Vitals:  Vitals Value Taken Time  BP    Temp 36.4 C 06/16/24 15:11  Pulse 75 06/16/24 15:14  Resp 17 06/16/24 15:14  SpO2 100 % 06/16/24 15:14  Vitals shown include unfiled device data.  Last Pain:  Vitals:   06/16/24 1104  TempSrc: Oral         Complications: No notable events documented.

## 2024-06-16 NOTE — Op Note (Signed)
 06/16/2024  6:26 PM  PATIENT:  Tracy Hayes  89 y.o. female  PRE-OPERATIVE DIAGNOSIS:  LEFT PERIPROSTHETIC SUPRACONDYLAR FEMUR FRACTURE  POST-OPERATIVE DIAGNOSIS: LEFT PERIPROSTHETIC SUPRACONDYLAR FEMUR FRACTURE   PROCEDURE:  OPEN REDUCTION INTERNAL FIXATION OF LEFT PERIPROSTHETIC FEMUR FRACTURE WITH Smith Nephew PLATE  SURGEON:  Tracy BRUCH, MD  PHYSICIAN ASSISTANT: Francis Mt, PA-C  ANESTHESIA:   GENERAL  I/O:  Total I/O In: 1530 [I.V.:800; Blood:630; IV Piggyback:100] Out: 50 [Blood:50]  SPECIMEN:  No Specimen  TOURNIQUET:  NONE  COMPLICATIONS: NONE  DICTATION: Note written in EPIC  DISPOSITION: TO PACU  CONDITION: STABLE  DELAY START OF DVT PROPHYLAXIS BECAUSE OF BLEEDING RISK: NO  BRIEF SUMMARY OF INDICATION FOR PROCEDURE:  Tracy Hayes is a 89 y.o. who sustained a comminuted supracondylar femur fracture, which was quite distal and near to the femoral implant, in a fall at her facility. The risks and benefits of surgery were discussed with the patient's son, including the possibility of infection, nerve injury, vessel injury, wound breakdown, arthritis, symptomatic hardware, DVT/ PE, loss of motion, malunion, nonunion, heart attack, stroke, death, implant loosening, and need for further surgery among others.  These risks were acknowledged and consent provided to proceed.   BRIEF SUMMARY OF PROCEDURE:  The patient was taken to the operating room where general anesthesia was induced and after receipt of preoperative antibiotics.  The left lower extremity was prepped and draped in usual sterile fashion.  No tourniquet was used during the procedure.  A radiolucent triangle was placed underneath the femur and towel bumps to restore appropriate alignment and length. C-arm was brought in to confirm the appropriate position of the distal incision.  This was checked on lateral as well.  An incision was then made.  Dissection was carried down to the IT band.  It  was split in line with the incision.  The deep retractor was placed.  Hematoma evacuated. The femur was button holed through the quadricept and could not be reduced closed or without freeing it from within the muscle. We were then able to achieve reduction.  My assistant pulled and maintained traction and dialed in the rotation for alignment.  I then introduced the [employer] plate.  I placed a pin distally parallel to the femoral component and joint line of the tibial tray and then checked the position on both AP and lateral views proximally, placing a large single screw distally and a drill bit through the most proximal hole.  To create a bridge construct. I then placed multiple screws in the articular block, checking their trajector and alignment with fluoro, followed by additional standard screws proximally.  Locking caps were placed over the heads of the screws distally but none proximally after confirming appropriate position of all screws on orthogonal views. Wounds were irrigated thoroughly and then closed in standard layered fashion using #1 Vicryl for the tensor, 0 Vicryl for the deep subcu, 2-0 Vicryl and 3-0 vertical mattress sutures for the skin.  A sterile gently compressive dressing was then applied with an Ace wrap from foot to thigh as well as a knee immobilizer until the patient wakes up adequately from anesthesia at which time she will be allowed unrestricted range of motion. Francis Mt, PA-C, assisted me throughout as did a PA student and an assistant was necessary to obtain and maintain reduction during provisional and definitive fixation and also assisted with wound closure.   PROGNOSIS:  Because of comorbidities patient is at increased risk for perioperative complications.  PT/ OT to assist with WBAT for transfers and unrestricted range of motion of the knee without bracing.  Formal pharmacologic DVT prophylaxis with Lovenox  while in the hospital only. F/u in 10-14 days for removal of  sutures.     Tracy Hayes. Tracy Hayes, M.D.

## 2024-06-16 NOTE — Progress Notes (Signed)
 PROGRESS NOTE   Tracy Hayes  FMW:[id number] DOB: 01/22/1924 DOA: 06/15/2024 PCP: Pcp, No   Chief Complaint  Patient presents with   Fall   Level of care: Progressive  Brief Admission History:  89 y.o. female with medical history significant for hypertension, dementia, PAD, hypothyroidism, depression, anxiety, CKD 3A, and chronic pain who presents for evaluation of fall with left leg injury.    Patient reportedly fell on 06/14/2024 or 06/15/2024 at her nursing facility.  She had radiographs taken today which were concerning for fracture, prompting her transfer to the ED.  There was no report of head injury.  She is mainly confined to a wheelchair during the day at her baseline.   ED Course: Upon arrival to the ED, patient is found to be afebrile and saturating well on room air with transient tachycardia and SBP in the 80s initially.  Labs are most notable for BUN 36, creatinine 1.08, normal WBC, and hemoglobin 8.9.  Plain films of the left knee are concerning for acute displaced and angulated fracture of the left femur just proximal to the femoral component of her knee replacement.   Orthopedic surgery (Dr. Celena) was consulted by the ED physician and the patient was treated with IV fluids and fentanyl .   Assessment and Plan:  Displaced Left distal femur fracture  - Based on the available data, Tracy Hayes presents an estimated 0.8% risk of perioperative MI or cardiac arrest; no further preoperative cardiac evaluation is indicated  - Continue NPO, pain-control, supportive care   - Pt awaiting transfer to [facility] for ORIF with Dr. Celena    Acute Blood Loss Anemia  - Hgb 7.8 down from 10.8 in October 2024  - No overt bleeding, will need type & screen once she is at [facility], repeat CBC in am, transfuse as needed     Hypertension  - Treat as-needed only for now     Hyperthyroidism  - Tapazole     OSA  - CPAP while sleeping     Dementia  - Delirium precautions    Anxiety  - Buspar       DVT prophylaxis: SCDs  Code Status: DNR, paperwork documenting this at bedside  Level of Care: Level of care: Progressive Family Communication: Son updated from ED   Consultants:  Orthopedics Dr Tracy Hayes  Procedures:  Tentative ORIF 7/18  Antimicrobials:    Subjective: Pt complains of ongoing left leg pain, discomfort, improved with pain medication given  Objective: Vitals:   06/16/24 0045 06/16/24 0100 06/16/24 0215 06/16/24 0552  BP:  (!) 147/45 (!) 149/108 (!) 144/48  Pulse: 75 74 78 71  Resp: 19 14 15 16   Temp:    98.4 F (36.9 C)  TempSrc:      SpO2: 91% 100% 92% 98%  Weight:      Height:        Intake/Output Summary (Last 24 hours) at 06/16/2024 0723 Last data filed at 06/15/2024 2011 Gross per 24 hour  Intake 1000 ml  Output --  Net 1000 ml   Filed Weights   06/15/24 1723  Weight: 52.5 kg   Examination:  General exam: Appears calm and NAD  Respiratory system: Clear to auscultation. Respiratory effort normal. Cardiovascular system: normal S1 & S2 heard. No JVD, murmurs, rubs, gallops or clicks. No pedal edema. Gastrointestinal system: Abdomen is nondistended, soft and nontender. No organomegaly or masses felt. Normal bowel sounds heard. Central nervous system: Alert and oriented. No focal neurological deficits. Extremities: bruising seen  left leg, shortened, rotated left leg, warm feet, distal pulses palpated on left. Neurovascularly intact bilateral LEs. Skin: No rashes, lesions or ulcers. Psychiatry: Judgement and insight appear diminished. Mood & affect appropriate.   Data Reviewed: I have personally reviewed following labs and imaging studies  CBC: Recent Labs  Lab 06/15/24 1739 06/16/24 0456  WBC 8.2 8.0  NEUTROABS 5.5 5.3  HGB 8.9* 7.8*  HCT 26.5* 23.9*  MCV 99.6 100.8*  PLT 174 150    Basic Metabolic Panel: Recent Labs  Lab 06/15/24 1739 06/16/24 0456  NA 142 142  K 4.2 3.9  CL 105 108  CO2 26 25  GLUCOSE 113* 108*  BUN 36*  29*  CREATININE 1.08* 0.86  CALCIUM 9.0 8.3*    CBG: No results for input(s): GLUCAP in the last 168 hours.  No results found for this or any previous visit (from the past 240 hours).   Radiology Studies: Chest Portable 1 View Result Date: 06/16/2024 EXAM: 1 VIEW XRAY OF THE CHEST 06/16/2024 12:33:00 AM COMPARISON: Chest radiograph dated 08/26/2023. CLINICAL HISTORY: Preoperative examination - Closed fracture of left distal femur. FINDINGS: LUNGS AND PLEURA: No focal consolidation, pleural effusion, or pneumothorax. HEART AND MEDIASTINUM: Stable cardiomediastinal silhouette. Aortic atherosclerotic calcification. BONES AND SOFT TISSUES: No acute osseous abnormality. IMPRESSION: 1. No acute cardiopulmonary pathology. Electronically signed by: Tracy Gatlin MD 06/16/2024 12:50 AM EDT RP Workstation: [id number]   DG FEMUR MIN 2 VIEWS LEFT Result Date: 06/15/2024 EXAM: 2 VIEW(S) XRAY OF THE LEFT FEMUR 06/15/2024 08:40:00 PM COMPARISON: Same day femur radiographs. CLINICAL HISTORY: 809823 Fall 190176. Post reduction FINDINGS: BONES AND JOINTS: Redemonstrated periprosthetic femur fracture about the femoral component of the TKA. There is decreased but remaining significant apex anterior angulation, posterior displacement of the distal fragment, and foreshortening. SOFT TISSUES: Adjacent soft tissue swelling. IMPRESSION: 1. Redemonstrated periprosthetic femur fracture about the femoral component of the TKA. There is decreased but remaining significant apex anterior angulation, posterior displacement of the distal fragment, and foreshortening. Electronically signed by: Tracy Gatlin MD 06/15/2024 08:47 PM EDT RP Workstation: [id number]   DG Femur Min 2 Views Left Result Date: 06/15/2024 CLINICAL DATA:  History of femur fracture EXAM: LEFT FEMUR 2 VIEWS COMPARISON:  07/21/2023 report, pelvis radiograph 08/26/2023 FINDINGS: Left femoral head projects in joint. Knee replacement. Acute fracture just above  the femoral prosthesis with greater than 1 shaft diameter posterior displacement of distal fracture fragment and marked apex anterior angulation at the fracture site. Generalized soft tissue edema IMPRESSION: Acute displaced and angulated fracture just above the femoral component of the knee replacement Electronically Signed   By: Luke Bun M.D.   On: 06/15/2024 18:53   Scheduled Meds:  busPIRone  2.5 mg Oral Daily   chlorhexidine   60 mL Topical Once   famotidine  20 mg Oral Daily   methimazole  5 mg Oral Daily   povidone-iodine   2 Application Topical Once   Continuous Infusions:  sodium chloride  75 mL/hr at 06/16/24 0042    ceFAZolin  (ANCEF ) IV      LOS: 1 day   Time spent: 50 mins  Ledonna Dormer Vicci, MD How to contact the [facility] Attending or Consulting provider 7A - 7P or covering provider during after hours 7P -7A, for this patient?  Check the care team in [facility] and look for a) attending/consulting [facility] provider listed and b) the [facility] team listed Log into [email or web address] to find provider on call.  Locate the [facility] provider you are looking for under [facility] and  page to a number that you can be directly reached. If you still have difficulty reaching the provider, please page the [facility] (Director on Call) for the Hospitalists listed on amion for assistance.  06/16/2024, 7:23 AM

## 2024-06-16 NOTE — Progress Notes (Signed)
 [facility]  Discussed plan with son Agreeable for ORIF for palliative measures as she is chair bound   Son agrees for transfer to [facility] MICAEL Deward, PA-C [phone] ([phone]) 06/16/2024, 10:18 AM  [facility] [address] [place] [address] [phone] (F)      Patient ID: Tracy Hayes, female   DOB: 1924-10-17, 89 y.o.   MRN: [id number]

## 2024-06-16 NOTE — Progress Notes (Signed)
 Spoke at length with patient's son, Paelyn Smick, [phone].  The risks and benefits of surgical repair of the severely displaced right distal femur were discussed with the patient's son, including the possibility of heart attack, stroke, death, infection, nerve injury, vessel injury, wound breakdown, arthritis, symptomatic hardware, DVT/ PE, loss of motion, malunion, nonunion, and need for further surgery among others.  We also specifically discussed the risk of vascular injury without repair.  These risks were acknowledged and consent was provided to proceed.  Full consultation is still to follow when patient arrives to preop.  Ozell Bruch, MD [facility], [facility] [phone]

## 2024-06-16 NOTE — Progress Notes (Signed)
 Initial Nutrition Assessment  DOCUMENTATION CODES:   Underweight  INTERVENTION:   When diet advanced, offer Ensure Plus High Protein po TID, each supplement provides 350 kcal and 20 grams of protein. MVI with minerals daily.  NUTRITION DIAGNOSIS:   Increased nutrient needs related to post-op healing, hip fracture as evidenced by estimated needs.  GOAL:   Patient will meet greater than or equal to 90% of their needs  MONITOR:   PO intake, Supplement acceptance  REASON FOR ASSESSMENT:   Consult Hip fracture protocol  ASSESSMENT:   89 yo female admitted with R distal femur fracture S/P unwitnessed fall. PMH includes Alzheimer's disease, largely wheelchair bound, HTN, diverticulosis, DJD, CKD, hyperthyroidism, chronic constipation.  Transferred to [facility] this morning. Currently in the OR for ORIF. Unable to complete NFPE at this time.   Labs reviewed.  Medications reviewed and include pepcid. IVF: NS at 75 ml/h, LR at 10 ml/h  Patient is underweight with BMI =18.13.  Weight hx reviewed. She has had 11% weight loss over the past year, which is not significant for the time frame. She is at increased nutrition risk, given underweight status and having surgery today. She would benefit from a PO supplement when diet is advanced to maximize protein and calorie intake to support healing and recovery.  NUTRITION - FOCUSED PHYSICAL EXAM:  Unable to complete  Diet Order:   Diet Order             Diet NPO time specified  Diet effective now                   EDUCATION NEEDS:   No education needs have been identified at this time  Skin:  Skin Assessment: Reviewed RN Assessment  Last BM:  no BM documented  Height:   Ht Readings from Last 1 Encounters:  06/16/24 5' 7 (1.702 m)    Weight:   Wt Readings from Last 1 Encounters:  06/16/24 52.5 kg    Ideal Body Weight:  61.4 kg  BMI:  Body mass index is 18.13 kg/m.  Estimated Nutritional Needs:   Kcal:   1350-1550  Protein:  70-80 gm  Fluid:  1.5 L   Suzen HUNT RD, LDN, CNSC Contact via secure chat. If unavailable, use group chat RD Inpatient.

## 2024-06-16 NOTE — H&P (Signed)
 History and Physical    Zaharah TOSHI ISHII FMW:[id number] DOB: December 19, 1923 DOA: 06/15/2024  PCP: Pcp, No   Patient coming from: SNF  Chief Complaint: Fall with left leg injury   HPI: Jeanelle ARYKA COONRADT is a 89 y.o. female with medical history significant for hypertension, dementia, PAD, hypothyroidism, depression, anxiety, CKD 3A, and chronic pain who presents for evaluation of fall with left leg injury.   Patient reportedly fell on 06/14/2024 or 06/15/2024 at her nursing facility.  She had radiographs taken today which were concerning for fracture, prompting her transfer to the ED.  There was no report of head injury.  She is mainly confined to a wheelchair during the day at her baseline.  ED Course: Upon arrival to the ED, patient is found to be afebrile and saturating well on room air with transient tachycardia and SBP in the 80s initially.  Labs are most notable for BUN 36, creatinine 1.08, normal WBC, and hemoglobin 8.9.  Plain films of the left knee are concerning for acute displaced and angulated fracture of the left femur just proximal to the femoral component of her knee replacement.  Orthopedic surgery (Dr. Celena) was consulted by the ED physician and the patient was treated with IV fluids and fentanyl .  Review of Systems:  ROS Limited by patient's clinical condition.  Past Medical History:  Diagnosis Date   Alzheimer's disease with early onset (HCC)    Anxiety    Basal cell carcinoma    Cardiac murmur    Chronic constipation    CKD stage 3a, GFR 45-59 ml/min (HCC) 06/16/2024   Depression    Diverticulosis    DJD (degenerative joint disease)    Essential hypertension, benign    History of pneumonia    Hyperthyroidism 06/16/2024   OSA (obstructive sleep apnea)     Past Surgical History:  Procedure Laterality Date   S/P NASAL TIP RESECTION  1996   S/P POLYPECTOMY      Social History:   reports that she has never smoked. She has never used smokeless tobacco. She reports  that she does not drink alcohol and does not use drugs.  Allergies  Allergen Reactions   Ace Inhibitors Other (See Comments)    Unknown    Aspirin Other (See Comments)    Unknown    Benadryl [Diphenhydramine Hcl] Other (See Comments)    Unknown    Codeine Other (See Comments)    Unknown    Peanut-Containing Drug Products Other (See Comments)    Unknown    Penicillins Other (See Comments)    Unknown    Phenobarbital Other (See Comments)    Unknown    Sulfa Antibiotics Other (See Comments)    Unknown    Vitamin B12 Other (See Comments)    Unknown    Caffeine Palpitations    Family History  Problem Relation Age of Onset   Stroke Mother    Stroke Father      Prior to Admission medications   Medication Sig Start Date End Date Taking? Authorizing Provider  busPIRone (BUSPAR) 5 MG tablet Take 2.5 mg by mouth daily at 6 (six) AM.   Yes [provider]  famotidine (PEPCID) 20 MG tablet Take 20 mg by mouth daily.   Yes [provider]  methimazole (TAPAZOLE) 5 MG tablet Take 5 mg by mouth daily.   Yes [provider]  acetaminophen (TYLENOL) 325 MG tablet Take 650 mg by mouth every 6 (six) hours as needed.  [provider]  alendronate (FOSAMAX) 70 MG tablet Take 70 mg by mouth once a week. Take with a full glass of water on an empty stomach. Takes on Saturday    [provider]  Calcium Carb-Cholecalciferol (OYSTER SHELL CALCIUM) 500-400 MG-UNIT TABS Take 1 tablet by mouth 2 (two) times daily.    [provider]  fluticasone (FLONASE) 50 MCG/ACT nasal spray Place 1 spray into both nostrils daily.    [provider]  loperamide (IMODIUM A-D) 2 MG tablet Take 2 mg by mouth 4 (four) times daily as needed for diarrhea or loose stools.    [provider]  LORazepam (ATIVAN) 0.5 MG tablet Take 0.5 mg by mouth 2 (two) times daily.     [provider]  losartan-hydrochlorothiazide (HYZAAR) 100-12.5 MG per  tablet Take 1 tablet by mouth daily.    [provider]  predniSONE (DELTASONE) 50 MG tablet Take 50 mg by mouth daily with breakfast.    [provider]  ranitidine (ZANTAC) 150 MG tablet Take 150 mg by mouth 2 (two) times daily.    [provider]  sertraline (ZOLOFT) 50 MG tablet Take 50 mg by mouth 2 (two) times daily.    [provider]    Physical Exam: Vitals:   06/15/24 2015 06/15/24 2045 06/15/24 2100 06/15/24 2115  BP: (!) 120/45 131/65 (!) 128/117   Pulse: 72 75 75 78  Resp:  16 18   Temp:      TempSrc:      SpO2: 96% 100% 100% 100%  Weight:      Height:        Constitutional: NAD, no diaphoresis   Eyes: PERTLA, lids and conjunctivae normal ENMT: Mucous membranes are moist. Posterior pharynx clear of any exudate or lesions.   Neck: supple, no masses  Respiratory: no wheezing, no crackles. No accessory muscle use.  Cardiovascular: S1 & S2 heard, regular rate and rhythm. Mild lower leg edema.  Abdomen: No tenderness, soft. Bowel sounds active.  Musculoskeletal: no clubbing / cyanosis. Tenderness and deformity just proximal to left knee; neurovascularly intact.   Skin: Ecchymosis involving left leg. Warm, dry, well-perfused. Neurologic: CN 2-12 grossly intact. Moving all extremities. Alert but disoriented.   Psychiatric: Calm. Cooperative.    Labs and Imaging on Admission: I have personally reviewed following labs and imaging studies  CBC: Recent Labs  Lab 06/15/24 1739  WBC 8.2  NEUTROABS 5.5  HGB 8.9*  HCT 26.5*  MCV 99.6  PLT 174   Basic Metabolic Panel: Recent Labs  Lab 06/15/24 1739  NA 142  K 4.2  CL 105  CO2 26  GLUCOSE 113*  BUN 36*  CREATININE 1.08*  CALCIUM 9.0   GFR: Estimated Creatinine Clearance: 24.1 mL/min (A) (by C-G formula based on SCr of 1.08 mg/dL (H)). Liver Function Tests: No results for input(s): AST, ALT, ALKPHOS, BILITOT, PROT, ALBUMIN in the last 168 hours. No results for  input(s): LIPASE, AMYLASE in the last 168 hours. No results for input(s): AMMONIA in the last 168 hours. Coagulation Profile: Recent Labs  Lab 06/15/24 2231  INR 1.1   Cardiac Enzymes: No results for input(s): CKTOTAL, CKMB, CKMBINDEX, TROPONINI in the last 168 hours. BNP (last 3 results) No results for input(s): PROBNP in the last 8760 hours. HbA1C: No results for input(s): HGBA1C in the last 72 hours. CBG: No results for input(s): GLUCAP in the last 168 hours. Lipid Profile: No results for input(s): CHOL, HDL, LDLCALC, TRIG, CHOLHDL,  LDLDIRECT in the last 72 hours. Thyroid  Function Tests: No results for input(s): TSH, T4TOTAL, FREET4, T3FREE, THYROIDAB in the last 72 hours. Anemia Panel: No results for input(s): VITAMINB12, FOLATE, FERRITIN, TIBC, IRON, RETICCTPCT in the last 72 hours. Urine analysis:    Component Value Date/Time   COLORURINE YELLOW 09/11/2023 1454   APPEARANCEUR HAZY (A) 09/11/2023 1454   LABSPEC 1.011 09/11/2023 1454   PHURINE 7.0 09/11/2023 1454   GLUCOSEU NEGATIVE 09/11/2023 1454   HGBUR NEGATIVE 09/11/2023 1454   BILIRUBINUR NEGATIVE 09/11/2023 1454   KETONESUR NEGATIVE 09/11/2023 1454   PROTEINUR NEGATIVE 09/11/2023 1454   NITRITE NEGATIVE 09/11/2023 1454   LEUKOCYTESUR SMALL (A) 09/11/2023 1454   Sepsis Labs: @LABRCNTIP (procalcitonin:4,lacticidven:4) )No results found for this or any previous visit (from the past 240 hours).   Radiological Exams on Admission: DG FEMUR MIN 2 VIEWS LEFT Result Date: 06/15/2024 EXAM: 2 VIEW(S) XRAY OF THE LEFT FEMUR 06/15/2024 08:40:00 PM COMPARISON: Same day femur radiographs. CLINICAL HISTORY: 809823 Fall 190176. Post reduction FINDINGS: BONES AND JOINTS: Redemonstrated periprosthetic femur fracture about the femoral component of the TKA. There is decreased but remaining significant apex anterior angulation, posterior displacement of the distal fragment, and  foreshortening. SOFT TISSUES: Adjacent soft tissue swelling. IMPRESSION: 1. Redemonstrated periprosthetic femur fracture about the femoral component of the TKA. There is decreased but remaining significant apex anterior angulation, posterior displacement of the distal fragment, and foreshortening. Electronically signed by: Norman Gatlin MD 06/15/2024 08:47 PM EDT RP Workstation: [id number]   DG Femur Min 2 Views Left Result Date: 06/15/2024 CLINICAL DATA:  History of femur fracture EXAM: LEFT FEMUR 2 VIEWS COMPARISON:  07/21/2023 report, pelvis radiograph 08/26/2023 FINDINGS: Left femoral head projects in joint. Knee replacement. Acute fracture just above the femoral prosthesis with greater than 1 shaft diameter posterior displacement of distal fracture fragment and marked apex anterior angulation at the fracture site. Generalized soft tissue edema IMPRESSION: Acute displaced and angulated fracture just above the femoral component of the knee replacement Electronically Signed   By: Luke Bun M.D.   On: 06/15/2024 18:53    EKG: Independently reviewed. Sinus rhythm, incomplete RBBB, LAFB.   Assessment/Plan   1. Left femur fracture  - Based on the available data, Mrs. Paul presents an estimated 0.8% risk of perioperative MI or cardiac arrest; no further preoperative cardiac evaluation is indicated  - Continue NPO, pain-control, supportive care    2. Anemia  - Hgb 8.9, down from 10.8 in October 2024  - No overt bleeding, will need type & screen once she is at [facility], repeat CBC in am, transfuse as needed    3. Hypertension  - Treat as-needed only for now    4. Hyperthyroidism  - Tapazole    5. OSA  - CPAP while sleeping    6. Dementia  - Delirium precautions   7. Anxiety  - Buspar    8. CKD 3A  - SCr is 1.08, was 0.95 in October 2024, likely has CKD 3A  - Renally-dose medications    DVT prophylaxis: SCDs  Code Status: DNR, paperwork documenting this at bedside  Level of Care:  Level of care: Progressive Family Communication: Son updated from ED  Disposition Plan:  Patient is from: SNF  Anticipated d/c is to: SNF  Anticipated d/c date is: 06/19/24  Patient currently: Pending orthopedic surgery consultation and possible operative femur repair  Consults called: Orthopedic surgery  Admission status: Inpatient     Evalene GORMAN Sprinkles, MD [facility]  06/16/2024, 12:12  AM

## 2024-06-16 NOTE — Anesthesia Procedure Notes (Signed)
 Procedure Name: Intubation Date/Time: 06/16/2024 1:07 PM  Performed by: Boyce Shilling, CRNAPre-anesthesia Checklist: Patient identified, Emergency Drugs available, Suction available, Timeout performed and Patient being monitored Patient Re-evaluated:Patient Re-evaluated prior to induction Oxygen Delivery Method: Circle system utilized Preoxygenation: Pre-oxygenation with 100% oxygen Induction Type: IV induction Ventilation: Mask ventilation without difficulty Laryngoscope Size: Mac and 3 Grade View: Grade I Tube type: Oral Tube size: 7.0 mm Number of attempts: 1 Airway Equipment and Method: Stylet Placement Confirmation: ETT inserted through vocal cords under direct vision, positive ETCO2, CO2 detector and breath sounds checked- equal and bilateral Secured at: 22 cm Tube secured with: Tape Dental Injury: Teeth and Oropharynx as per pre-operative assessment

## 2024-06-16 NOTE — Hospital Course (Signed)
 89 y.o. female with medical history significant for hypertension, dementia, PAD, hypothyroidism, depression, anxiety, CKD 3A, and chronic pain who presents for evaluation of fall with left leg injury.    Patient reportedly fell on 06/14/2024 or 06/15/2024 at her nursing facility.  She had radiographs taken today which were concerning for fracture, prompting her transfer to the ED.  There was no report of head injury.  She is mainly confined to a wheelchair during the day at her baseline.   ED Course: Upon arrival to the ED, patient is found to be afebrile and saturating well on room air with transient tachycardia and SBP in the 80s initially.  Labs are most notable for BUN 36, creatinine 1.08, normal WBC, and hemoglobin 8.9.  Plain films of the left knee are concerning for acute displaced and angulated fracture of the left femur just proximal to the femoral component of her knee replacement.   Orthopedic surgery (Dr. Celena) was consulted by the ED physician and the patient was treated with IV fluids and fentanyl .

## 2024-06-16 NOTE — ED Notes (Signed)
 Patients family member Norleen has been informed and agrees to the transfer to [facility] short stay via [facility] for surgery. Pt has dementia and is unable to sign for transfer.

## 2024-06-17 DIAGNOSIS — S72402A Unspecified fracture of lower end of left femur, initial encounter for closed fracture: Secondary | ICD-10-CM | POA: Diagnosis not present

## 2024-06-17 LAB — BPAM RBC
Blood Product Expiration Date: 202507182359
Blood Product Expiration Date: 202508112359
ISSUE DATE / TIME: 202507181259
ISSUE DATE / TIME: 202507181259
Unit Type and Rh: 6200
Unit Type and Rh: 8400

## 2024-06-17 LAB — BASIC METABOLIC PANEL WITH GFR
Anion gap: 6 (ref 5–15)
BUN: 23 mg/dL (ref 8–23)
CO2: 25 mmol/L (ref 22–32)
Calcium: 7.9 mg/dL — ABNORMAL LOW (ref 8.9–10.3)
Chloride: 108 mmol/L (ref 98–111)
Creatinine, Ser: 0.97 mg/dL (ref 0.44–1.00)
GFR, Estimated: 53 mL/min — ABNORMAL LOW (ref >60)
Glucose, Bld: 90 mg/dL (ref 70–99)
Potassium: 3.9 mmol/L (ref 3.5–5.1)
Sodium: 139 mmol/L (ref 135–145)

## 2024-06-17 LAB — TYPE AND SCREEN
ABO/RH(D): AB POS
Antibody Screen: NEGATIVE
Unit division: 0
Unit division: 0

## 2024-06-17 LAB — CBC
HCT: 32.1 % — ABNORMAL LOW (ref 36.0–46.0)
Hemoglobin: 10.9 g/dL — ABNORMAL LOW (ref 12.0–15.0)
MCH: 31.4 pg (ref 26.0–34.0)
MCHC: 34 g/dL (ref 30.0–36.0)
MCV: 92.5 fL (ref 80.0–100.0)
Platelets: 150 K/uL (ref 150–400)
RBC: 3.47 MIL/uL — ABNORMAL LOW (ref 3.87–5.11)
RDW: 16.5 % — ABNORMAL HIGH (ref 11.5–15.5)
WBC: 9.9 K/uL (ref 4.0–10.5)
nRBC: 0 % (ref 0.0–0.2)

## 2024-06-17 MED ORDER — ACETAMINOPHEN 325 MG PO TABS
650.0000 mg | ORAL_TABLET | Freq: Three times a day (TID) | ORAL | Status: DC
  Administered 2024-06-17 – 2024-06-19 (×6): 650 mg via ORAL
  Filled 2024-06-17 (×7): qty 2

## 2024-06-17 NOTE — Evaluation (Signed)
 Occupational Therapy Evaluation Patient Details Name: Tracy Hayes MRN: [id number] DOB: 30-Mar-1924 Today's Date: 06/17/2024   History of Present Illness   89 y.o. female who presents from memory care unit for evaluation of fall with left femur fx just proximal to femoral component of TKA. S/p ORIF L periprosthetic supracondylar femur fx 7/18. PMH significant for hypertension, dementia, PAD, hypothyroidism, depression, anxiety, CKD 3A, and chronic pain     Clinical Impressions PTA, per chart, pt from memory care and wheelchair dependent. Upon eval, pt unable to follow commands, with nonsensical speech throughout. Pt with bil knee contractures, and needing max-total A for seated grooming at EOB. Suspect pt to be at baseline. Not following commands. Recommending discharge back to memory care facility with no OT follow up.      If plan is discharge home, recommend the following:   Other (comment) (total A)     Functional Status Assessment   Patient has had a recent decline in their functional status and demonstrates the ability to make significant improvements in function in a reasonable and predictable amount of time.     Equipment Recommendations   None recommended by OT     Recommendations for Other Services         Precautions/Restrictions   Precautions Precautions: Fall Recall of Precautions/Restrictions: Impaired Restrictions Weight Bearing Restrictions Per Provider Order: Yes LLE Weight Bearing Per Provider Order: Weight bearing as tolerated     Mobility Bed Mobility Overal bed mobility: Needs Assistance Bed Mobility: Sidelying to Sit, Sit to Sidelying   Sidelying to sit: Total assist, +2 for physical assistance, +2 for safety/equipment     Sit to sidelying: Total assist, +2 for physical assistance, +2 for safety/equipment General bed mobility comments: pt unable to follow commands to assist    Transfers                   General  transfer comment: will require dependent lift (recommend mechanical lift)      Balance Overall balance assessment: Needs assistance Sitting-balance support: No upper extremity supported, Feet unsupported Sitting balance-Leahy Scale: Poor Sitting balance - Comments: did progress to CGA for several minutes; guarding for safety       Standing balance comment: unable                           ADL either performed or assessed with clinical judgement   ADL Overall ADL's : Needs assistance/impaired     Grooming: Oral care;Maximal assistance Grooming Details (indicate cue type and reason): bringing toothbrush to mouth but not sustaining task. seated EOB with CGA-min A                             Functional mobility during ADLs: Total assistance;+2 for physical assistance;+2 for safety/equipment       Vision   Additional Comments: pt unable to report; suspect decline in peripheral vision     Perception         Praxis         Pertinent Vitals/Pain Pain Assessment Pain Assessment: Faces Faces Pain Scale: Hurts whole lot Pain Location: LLE Pain Descriptors / Indicators: Discomfort, Grimacing, Guarding, Moaning Pain Intervention(s): Limited activity within patient's tolerance, Monitored during session     Extremity/Trunk Assessment Upper Extremity Assessment Upper Extremity Assessment: Generalized weakness   Lower Extremity Assessment Lower Extremity Assessment: Defer to PT evaluation RLE Deficits / Details:  knee flexion contracture ~70 degrees; no AROM observed LLE Deficits / Details: +edema and bruising; knee flexion contracture ~70 degrees   Cervical / Trunk Assessment Cervical / Trunk Assessment: Lordotic;Kyphotic   Communication Communication Communication: Impaired Factors Affecting Communication: Difficulty expressing self (due to dementia)   Cognition Arousal: Alert Behavior During Therapy: Restless Cognition: History of cognitive  impairments             OT - Cognition Comments: per chart, from memory care                 Following commands: Impaired Following commands impaired:  (following no commands)     Cueing  General Comments   Cueing Techniques: Verbal cues;Gestural cues;Tactile cues;Visual cues  BLE contracture   Exercises     Shoulder Instructions      Home Living Family/patient expects to be discharged to:: Other (Comment)                                 Additional Comments: return to memory care      Prior Functioning/Environment Prior Level of Function : Patient poor historian/Family not available             Mobility Comments: pt with bil knee contractures and appears she was a dependent transfer PTA (would recommend lift)      OT Problem List: Decreased strength;Decreased activity tolerance;Impaired balance (sitting and/or standing);Decreased cognition;Decreased safety awareness;Decreased knowledge of precautions;Decreased knowledge of use of DME or AE;Pain   OT Treatment/Interventions:        OT Goals(Current goals can be found in the care plan section)   Acute Rehab OT Goals OT Goal Formulation: Patient unable to participate in goal setting   OT Frequency:       Co-evaluation PT/OT/SLP Co-Evaluation/Treatment: Yes Reason for Co-Treatment: Complexity of the patient's impairments (multi-system involvement);For patient/therapist safety;Necessary to address cognition/behavior during functional activity PT goals addressed during session: Mobility/safety with mobility;Balance;Strengthening/ROM OT goals addressed during session: ADL's and self-care      AM-PAC OT 6 Clicks Daily Activity     Outcome Measure Help from another person eating meals?: A Lot Help from another person taking care of personal grooming?: A Lot Help from another person toileting, which includes using toliet, bedpan, or urinal?: Total Help from another person bathing  (including washing, rinsing, drying)?: Total Help from another person to put on and taking off regular upper body clothing?: Total Help from another person to put on and taking off regular lower body clothing?: Total 6 Click Score: 8   End of Session Nurse Communication: Mobility status  Activity Tolerance: Patient tolerated treatment well Patient left: in bed;with call bell/phone within reach;with bed alarm set  OT Visit Diagnosis: Unsteadiness on feet (R26.81);Muscle weakness (generalized) (M62.81);Pain                Time: 9084-9062 OT Time Calculation (min): 22 min Charges:  OT General Charges $OT Visit: 1 Visit OT Evaluation $OT Eval Low Complexity: 1 Low  Elma JONETTA Lebron FREDERICK, OTR/L [facility] Acute Rehabilitation Office: [phone]   Elma JONETTA Lebron 06/17/2024, 1:18 PM

## 2024-06-17 NOTE — Plan of Care (Signed)
  Problem: Education: Goal: Knowledge of General Education information will improve Description: Including pain rating scale, medication(s)/side effects and non-pharmacologic comfort measures Outcome: Progressing   Problem: Clinical Measurements: Goal: Ability to maintain clinical measurements within normal limits will improve Outcome: Progressing Goal: Will remain free from infection Outcome: Progressing Goal: Respiratory complications will improve Outcome: Progressing Goal: Cardiovascular complication will be avoided Outcome: Progressing   Problem: Activity: Goal: Risk for activity intolerance will decrease Outcome: Progressing   Problem: Nutrition: Goal: Adequate nutrition will be maintained Outcome: Progressing   Problem: Coping: Goal: Level of anxiety will decrease Outcome: Progressing   Problem: Elimination: Goal: Will not experience complications related to bowel motility Outcome: Progressing Goal: Will not experience complications related to urinary retention Outcome: Progressing   Problem: Pain Managment: Goal: General experience of comfort will improve and/or be controlled Outcome: Progressing   Problem: Safety: Goal: Ability to remain free from injury will improve Outcome: Progressing   Problem: Skin Integrity: Goal: Risk for impaired skin integrity will decrease Outcome: Progressing

## 2024-06-17 NOTE — Progress Notes (Signed)
 Orthopaedic Trauma Service Progress Note  Patient ID: [name] MRN: [id number] DOB/AGE: 160-Nov-1925 89 y.o.  Subjective:   Confused but appears comfortable   ROS As above  Today's  total administered Morphine Milligram Equivalents: 0 Yesterday's total administered Morphine Milligram Equivalents: 48.75  Objective:   VITALS:   Vitals:   06/16/24 2115 06/17/24 0549 06/17/24 0737 06/17/24 1129  BP: 134/66 (!) 129/50 (!) 144/66 (!) 133/99  Pulse: 80 76 86 88  Resp: 20 20 20 19   Temp: 97.6 F (36.4 C) 99 F (37.2 C) 98.1 F (36.7 C) 98.2 F (36.8 C)  TempSrc: Axillary Axillary Oral Oral  SpO2: 99% 100% 100% 99%  Weight:      Height:        Estimated body mass index is 18.13 kg/m as calculated from the following:   Height as of this encounter: 5' 7 (1.702 m).   Weight as of this encounter: 52.5 kg.   Intake/Output      07/18 0701 07/19 0700 07/19 0701 07/20 0700   P.O. 0    I.V. (mL/kg) 800 (15.2) 2368.3 (45.1)   Blood 630    IV Piggyback 100    Total Intake(mL/kg) 1530 (29.1) 2368.3 (45.1)   Blood 50    Total Output 50    Net +1480 +2368.3        Urine Occurrence 1 x      LABS  Results for orders placed or performed during the hospital encounter of 06/15/24 (from the past 24 hours)  Prepare RBC (crossmatch)     Status: None   Collection Time: 06/16/24 12:57 PM  Result Value Ref Range   Order Confirmation      ORDER PROCESSED BY BLOOD BANK Performed at [facility], [address], [place], [place] [address]   CBC     Status: Abnormal   Collection Time: 06/17/24  4:27 AM  Result Value Ref Range   WBC 9.9 4.0 - 10.5 K/uL   RBC 3.47 (L) 3.87 - 5.11 MIL/uL   Hemoglobin 10.9 (L) 12.0 - 15.0 g/dL   HCT 67.8 (L) 63.9 - 53.9 %   MCV 92.5 80.0 - 100.0 fL   MCH 31.4 26.0 - 34.0 pg   MCHC 34.0 30.0 - 36.0 g/dL   RDW 83.4 (H) 88.4 - 84.4 %   Platelets 150 150 - 400  K/uL   nRBC 0.0 0.0 - 0.2 %  Basic metabolic panel     Status: Abnormal   Collection Time: 06/17/24  4:27 AM  Result Value Ref Range   Sodium 139 135 - 145 mmol/L   Potassium 3.9 3.5 - 5.1 mmol/L   Chloride 108 98 - 111 mmol/L   CO2 25 22 - 32 mmol/L   Glucose, Bld 90 70 - 99 mg/dL   BUN 23 8 - 23 mg/dL   Creatinine, Ser 9.02 0.44 - 1.00 mg/dL   Calcium 7.9 (L) 8.9 - 10.3 mg/dL   GFR, Estimated 53 (L) >60 mL/min   Anion gap 6 5 - 15     PHYSICAL EXAM:   Gen: in bed, lying on left side Ext:       Left Lower extremity   Minimal swelling  Ext warm   + DP pulse   Unable to get motor/sensory exam  Assessment/Plan: 1 Day Post-Op   Principal Problem:   Closed fracture of left distal femur (HCC) Active Problems:   Essential hypertension, benign   Alzheimer's disease with early onset (HCC)   OSA (obstructive sleep apnea)   Hyperthyroidism   CKD stage 3a, GFR 45-59 ml/min (HCC)   Anti-infectives (From admission, onward)    Start     Dose/Rate Route Frequency Ordered Stop   06/16/24 2000  ceFAZolin  (ANCEF ) IVPB 2g/100 mL premix        2 g 200 mL/hr over 30 Minutes Intravenous Every 8 hours 06/16/24 1632 06/17/24 2159   06/16/24 1000  ceFAZolin  (ANCEF ) IVPB 2g/100 mL premix        2 g 200 mL/hr over 30 Minutes Intravenous On call to O.R. 06/15/24 2212 06/16/24 1315     .  POD/HD#: 1  89 y/o female s/p fall with Left periprosthetic distal femur fracture   - fall  - L periprosthetic distal femur fracture s/p ORIF   WBAT   No ROM restrictions   Therapies   Dressing changes as needed starting on 06/19/2024  Follow up with ortho in 10-14 days   - DVT/PE prophylaxis:  Scds  - ID:   Periop abx   - Dispo:  Ortho issues stable      Francis MICAEL Mt, PA-C [phone] (C) 06/17/2024, 11:45 AM  [facility] [address] [place] [address] [phone] [phone] (F)    After 5pm and on the weekends please log on to  Amion, go to orthopaedics and the look under the [facility] Call for the provider(s) on call. You can also call our office at [phone] and then follow the prompts to be connected to the call team.  Patient ID: Tracy Hayes, female   DOB: 05-03-1924, 89 y.o.   MRN: [id number]

## 2024-06-17 NOTE — Evaluation (Signed)
 Physical Therapy Evaluation and Discharge Patient Details Name: Tracy Hayes MRN: [id number] DOB: [birth year] Today's Date: 06/17/2024  History of Present Illness  89 y.o. female who presents from memory care unit for evaluation of fall with left femur fx just proximal to femoral component of TKA. S/p ORIF L periprosthetic supracondylar femur fx 7/18. PMH significant for hypertension, dementia, PAD, hypothyroidism, depression, anxiety, CKD 3A, and chronic pain  Clinical Impression   Patient evaluated by Physical Therapy with no further acute PT needs identified. Patient is unable to follow commands. She has bil knee flexion contractures and has been w/c bound at a memory care unit. She remains a dependent lift/transfer if OOB is desired.  PT is signing off. Thank you for this referral.         If plan is discharge home, recommend the following: Two people to help with walking and/or transfers;Assistance with feeding;Direct supervision/assist for medications management;Assist for transportation;Supervision due to cognitive status   Can travel by private vehicle   No    Equipment Recommendations Hoyer lift  Recommendations for Other Services       Functional Status Assessment Patient has not had a recent decline in their functional status     Precautions / Restrictions Precautions Precautions: Fall Recall of Precautions/Restrictions: Impaired Restrictions Weight Bearing Restrictions Per Provider Order: Yes LLE Weight Bearing Per Provider Order: Weight bearing as tolerated      Mobility  Bed Mobility Overal bed mobility: Needs Assistance Bed Mobility: Sidelying to Sit, Sit to Sidelying   Sidelying to sit: Total assist, +2 for physical assistance, +2 for safety/equipment     Sit to sidelying: Total assist, +2 for physical assistance, +2 for safety/equipment General bed mobility comments: pt unable to follow commands to assist    Transfers                    General transfer comment: will require dependent lift (recommend mechanical lift)    Ambulation/Gait                  Administrator mobility:  (dependent)   Tilt Bed    Modified Rankin (Stroke Patients Only)       Balance Overall balance assessment: Needs assistance Sitting-balance support: No upper extremity supported, Feet unsupported Sitting balance-Leahy Scale: Poor Sitting balance - Comments: did progress to CGA for several minutes; guarding for safety       Standing balance comment: unable                             Pertinent Vitals/Pain Pain Assessment Pain Assessment: Faces Faces Pain Scale: Hurts whole lot Pain Location: LLE Pain Descriptors / Indicators: Discomfort, Grimacing, Guarding, Moaning Pain Intervention(s): Limited activity within patient's tolerance, Monitored during session, Repositioned    Home Living Family/patient expects to be discharged to:: Other (Comment)                   Additional Comments: return to memory care    Prior Function Prior Level of Function : Patient poor historian/Family not available             Mobility Comments: pt with bil knee contractures and appears she was a dependent transfer PTA (would recommend lift)       Extremity/Trunk Assessment   Upper Extremity Assessment Upper Extremity Assessment: Defer to OT evaluation  Lower Extremity Assessment Lower Extremity Assessment: RLE deficits/detail;LLE deficits/detail RLE Deficits / Details: knee flexion contracture ~70 degrees; no AROM observed LLE Deficits / Details: +edema and bruising; knee flexion contracture ~70 degrees    Cervical / Trunk Assessment Cervical / Trunk Assessment: Lordotic  Communication   Communication Communication: Impaired Factors Affecting Communication: Difficulty expressing self (due to dementia)    Cognition Arousal: Alert Behavior  During Therapy: Restless   PT - Cognitive impairments: History of cognitive impairments                       PT - Cognition Comments: pt verbalizing throughout, however comments not related to current situation Following commands: Impaired Following commands impaired:  (following no commands)     Cueing Cueing Techniques: Verbal cues, Gestural cues, Tactile cues, Visual cues     General Comments      Exercises     Assessment/Plan    PT Assessment Patient does not need any further PT services  PT Problem List         PT Treatment Interventions      PT Goals (Current goals can be found in the Care Plan section)  Acute Rehab PT Goals Patient Stated Goal: unable PT Goal Formulation: All assessment and education complete, DC therapy    Frequency       Co-evaluation PT/OT/SLP Co-Evaluation/Treatment: Yes Reason for Co-Treatment: Complexity of the patient's impairments (multi-system involvement);For patient/therapist safety;Necessary to address cognition/behavior during functional activity PT goals addressed during session: Mobility/safety with mobility;Balance;Strengthening/ROM         AM-PAC PT 6 Clicks Mobility  Outcome Measure Help needed turning from your back to your side while in a flat bed without using bedrails?: Total Help needed moving from lying on your back to sitting on the side of a flat bed without using bedrails?: Total Help needed moving to and from a bed to a chair (including a wheelchair)?: Total Help needed standing up from a chair using your arms (e.g., wheelchair or bedside chair)?: Total Help needed to walk in hospital room?: Total Help needed climbing 3-5 steps with a railing? : Total 6 Click Score: 6    End of Session   Activity Tolerance: Other (comment) (limited by decr cognition) Patient left: in bed;with call bell/phone within reach;with bed alarm set;with nursing/sitter in room Nurse Communication: Mobility status;Need for  lift equipment PT Visit Diagnosis: History of falling (Z91.81)    Time: 9083-9062 PT Time Calculation (min) (ACUTE ONLY): 21 min   Charges:   PT Evaluation $PT Eval Low Complexity: 1 Low   PT General Charges $$ ACUTE PT VISIT: 1 Visit          Macario RAMAN, PT Acute Rehabilitation Services  Office [phone]   Macario SHAUNNA Soja 06/17/2024, 11:08 AM

## 2024-06-17 NOTE — Evaluation (Signed)
 Clinical/Bedside Swallow Evaluation Patient Details  Name: Tracy Hayes MRN: [id number] Date of Birth: Dec 13, 1923  Today's Date: 06/17/2024 Time: SLP Start Time (ACUTE ONLY): 0935 SLP Stop Time (ACUTE ONLY): 0948 SLP Time Calculation (min) (ACUTE ONLY): 13 min  Past Medical History:  Past Medical History:  Diagnosis Date   Alzheimer's disease with early onset (HCC)    Anxiety    Basal cell carcinoma    Cardiac murmur    Chronic constipation    CKD stage 3a, GFR 45-59 ml/min (HCC) 06/16/2024   Depression    Diverticulosis    DJD (degenerative joint disease)    Essential hypertension, benign    History of pneumonia    Hyperthyroidism 06/16/2024   OSA (obstructive sleep apnea)    Past Surgical History:  Past Surgical History:  Procedure Laterality Date   S/P NASAL TIP RESECTION  1996   S/P POLYPECTOMY     HPI:  89 yo female presenting 7/17 after unwitnessed fall. Found to have R distal femur fx  s/p ORIF 7/18.    PMH includes: Alzheimer's disease, largely wheelchair bound, HTN, diverticulosis, DJD, CKD, hyperthyroidism, chronic constipation, anxiety, PNA    Assessment / Plan / Recommendation  Clinical Impression  Pt has signs of a suspected oropharyngeal dysphagia, impacted by cognitive status. She needs cues to initiate PO intake and although she initiates a little chewing, she does not sustain attention to it well and leaves residuals in her oral cavity. Positioning is also limited by contractures with pt repositioned by SLP and RN into an elevated sidelying. Even with this, there was coughing observed on initial bolus of thin liquid. Once her head was supported even further with pillows there was no further coughing, although baseline throat clearing was observed regardless of consistency. Recommend downgrading diet to Dys 2 (finely chopped) textures and thin liquids with careful assistance provided during meals given mentation and posture.   SLP Visit Diagnosis: Dysphagia,  unspecified (R13.10)    Aspiration Risk       Diet Recommendation Dysphagia 2 (Fine chop);Thin liquid    Liquid Administration via: Cup;Straw Medication Administration: Crushed with puree Supervision: Staff to assist with self feeding;Full supervision/cueing for compensatory strategies Compensations: Minimize environmental distractions;Slow rate;Small sips/bites Postural Changes: Seated upright at 90 degrees    Other  Recommendations Oral Care Recommendations: Oral care BID     Assistance Recommended at Discharge    Functional Status Assessment Patient has had a recent decline in their functional status and demonstrates the ability to make significant improvements in function in a reasonable and predictable amount of time.  Frequency and Duration min 2x/week  2 weeks       Prognosis Prognosis for improved oropharyngeal function: Good Barriers to Reach Goals: Cognitive deficits      Swallow Study   General HPI: 89 yo female presenting 7/17 after unwitnessed fall. Found to have R distal femur fx  s/p ORIF 7/18.    PMH includes: Alzheimer's disease, largely wheelchair bound, HTN, diverticulosis, DJD, CKD, hyperthyroidism, chronic constipation, anxiety, PNA Type of Study: Bedside Swallow Evaluation Previous Swallow Assessment: none in chart Diet Prior to this Study: Regular;Thin liquids (Level 0) Temperature Spikes Noted: No Respiratory Status: Room air History of Recent Intubation: Yes Total duration of intubation (days):  (for procedure only) Date extubated: 06/16/24 Behavior/Cognition: Alert;Doesn't follow directions Oral Cavity Assessment: Within Functional Limits Oral Care Completed by SLP: No Self-Feeding Abilities: Total assist Patient Positioning: Other (comment) (elevated sidelying) Baseline Vocal Quality: Normal Volitional Cough: Cognitively unable  to elicit Volitional Swallow: Unable to elicit    Oral/Motor/Sensory Function Overall Oral Motor/Sensory Function:   (not following commands for direct assessment)   Ice Chips Ice chips: Not tested   Thin Liquid Thin Liquid: Impaired Presentation: Straw Oral Phase Impairments: Poor awareness of bolus Pharyngeal  Phase Impairments: Cough - Immediate;Throat Clearing - Delayed    Nectar Thick Nectar Thick Liquid: Not tested   Honey Thick Honey Thick Liquid: Not tested   Puree Puree: Impaired Presentation: Spoon Pharyngeal Phase Impairments: Throat Clearing - Delayed   Solid     Solid: Impaired Oral Phase Impairments: Impaired mastication;Poor awareness of bolus Oral Phase Functional Implications: Oral residue Pharyngeal Phase Impairments: Throat Clearing - Delayed      Leita SAILOR., M.A. CCC-SLP Acute Rehabilitation Services Office: [phone]  Secure chat preferred  06/17/2024,11:26 AM

## 2024-06-17 NOTE — Progress Notes (Signed)
 [facility] ROUNDING NOTE Tracy Hayes FMW:[id number]  DOB: [birth year]  DOA: 06/15/2024  PCP: Pcp, No  06/17/2024,6:05 AM  LOS: 2 days    Code Status: DNR limited code   from: Skilled facility current Dispo: Likely will need skilled facility    89 year old female skilled facility resident-baseline wheelchair-bound Previous syncope worked up Dr. Willaim OV 2015 echo 60-65% chronic diastolic failure HTN Hypothyroid bipolar CKD 3 A  S/p mechanical fall 7/15 @SNF -taken to [facility] ED-Dr. Celena trauma service consulted-family D/W orthopedics-agreeable ORIF palliative measures given chair bound 7/18 left periprosthetic supracondylar femur fracture repair with ORIF and [employer] Nephew plate Dr. Celena     Plan  Left periprosthetic supracondylar femoral fracture status post-repair Will need skilled-ORIF performed-not seemingly in pain, discontinue fentanyl  as for pain control, scheduled Tylenol  650 every 8 with as needed Oxy IR 2.5 every 4 as needed given underlying severe dementia  Dysphagia-was unable to clear secretions so downgraded by speech to dysphagia 2 diet Nursing aware to ensure is close to liquids to prevent dehydration  Anemia of acute perioperative blood loss expected Transfused this admission 2 units PRBC with acceptable rise-recheck labs a.m.  Mild thrombocytopenia-likely age-related  Impaired glucose tolerance Would not control aggressively given severe dementia, advanced age and risk for hypoglycemia-only monitor  Hypothyroidism-apparently on Tapazole ?  Graves' disease previously Continue Tapazole  5 mg daily  AKI on admission Secondary to poor p.o. etc.-improved Underlying CKD 3A  Bipolar Continue BuSpar  2.5 every morning  Question of dementia Patient followed by [facility] in the outpatient setting-    Chronic conditions not addressed Previous syncope with HFpEF 2015  Data Reviewed:   Hemoglobin 7.8-->10.9, platelet 150, WBC 9.9 BUN/creatinine  29/0.8-->23/0.9  DVT prophylaxis: SCD  Status is: Inpatient Remains inpatient appropriate because:  Likely will require skilled placement postprocedure   Called Son Tracy Hayes on telephone [phone]   Subjective: Confused cannot give me history-quite thirsty drank Ensure when offered Does not seem to be in pain Nurse relates was not able to clear her cough this morning so was downgraded by speech to Dys 2  Objective + exam Vitals:   06/16/24 1726 06/16/24 2000 06/16/24 2115 06/17/24 0549  BP: 112/67  134/66 (!) 129/50  Pulse: 70 73 80 76  Resp: 15 14 20 20   Temp: (!) 97.5 F (36.4 C)  97.6 F (36.4 C) 99 F (37.2 C)  TempSrc: Oral  Axillary Axillary  SpO2: 98% 100% 99% 100%  Weight:      Height:       Filed Weights   06/15/24 1723 06/16/24 1104  Weight: 52.5 kg 52.5 kg    Examination: Awake and coherent pleasant talking somewhat nonsensically-in mittens S1-S2 holosystolic murmur 3/6 LLSE Abdomen soft no rebound ROM intact seems to favor laying on left side No lower extremity edema    Scheduled Meds:  sodium chloride    Intravenous Once   sodium chloride    Intravenous Once   busPIRone   2.5 mg Oral Daily   chlorhexidine   60 mL Topical Once   famotidine   20 mg Oral Daily   feeding supplement  237 mL Oral TID BM   methimazole   5 mg Oral Daily   multivitamin with minerals  1 tablet Oral Daily   povidone-iodine   2 Application Topical Once   Continuous Infusions:  sodium chloride  75 mL/hr at 06/17/24 0540    ceFAZolin  (ANCEF ) IV 2 g (06/17/24 0541)    Time 40  Colen Grimes, MD  [facility]

## 2024-06-18 DIAGNOSIS — S72402A Unspecified fracture of lower end of left femur, initial encounter for closed fracture: Secondary | ICD-10-CM | POA: Diagnosis not present

## 2024-06-18 LAB — BASIC METABOLIC PANEL WITH GFR
Anion gap: 10 (ref 5–15)
BUN: 18 mg/dL (ref 8–23)
CO2: 20 mmol/L — ABNORMAL LOW (ref 22–32)
Calcium: 7.8 mg/dL — ABNORMAL LOW (ref 8.9–10.3)
Chloride: 110 mmol/L (ref 98–111)
Creatinine, Ser: 0.79 mg/dL (ref 0.44–1.00)
GFR, Estimated: >60 mL/min (ref >60)
Glucose, Bld: 98 mg/dL (ref 70–99)
Potassium: 3.7 mmol/L (ref 3.5–5.1)
Sodium: 140 mmol/L (ref 135–145)

## 2024-06-18 LAB — CBC
HCT: 31.5 % — ABNORMAL LOW (ref 36.0–46.0)
Hemoglobin: 10.6 g/dL — ABNORMAL LOW (ref 12.0–15.0)
MCH: 31.4 pg (ref 26.0–34.0)
MCHC: 33.7 g/dL (ref 30.0–36.0)
MCV: 93.2 fL (ref 80.0–100.0)
Platelets: 157 K/uL (ref 150–400)
RBC: 3.38 MIL/uL — ABNORMAL LOW (ref 3.87–5.11)
RDW: 15.9 % — ABNORMAL HIGH (ref 11.5–15.5)
WBC: 7.8 K/uL (ref 4.0–10.5)
nRBC: 0 % (ref 0.0–0.2)

## 2024-06-18 NOTE — Progress Notes (Signed)
 [facility] ROUNDING NOTE Tracy Hayes FMW:[id number]  DOB: Feb 17, 1924  DOA: 06/15/2024  PCP: Pcp, No  06/18/2024,6:47 AM  LOS: 3 days    Code Status: DNR limited code   from: Skilled facility current Dispo: Likely will need skilled facility    89 year old female skilled facility resident-baseline wheelchair-bound Previous syncope worked up Dr. Willaim OV 2015 echo 60-65% chronic diastolic failure HTN Hypothyroid bipolar CKD 3 A  S/p mechanical fall 7/15 @SNF -taken to [facility] ED-Dr. Celena trauma service consulted-family D/W orthopedics-agreeable ORIF palliative measures given chair bound 7/18 left periprosthetic supracondylar femur fracture repair with ORIF and Claudene [employer] Nephew plate Dr. Celena     Plan  Left periprosthetic supracondylar femoral fracture status post-repair scheduled Tylenol  650 every 8 -prn Oxy IR 2.5 every 4 as needed --- avoid strong IV narcotics in 89 year old  Dysphagia-was unable to clear secretions so downgraded by speech to dysphagia 2 diet Nursing aware to ensure is close to liquids to prevent dehydration--watch mentation as well as oxygenation-if decompensates we will have to have discussions with family  Anemia of acute perioperative blood loss expected Transfused this admission 2 units PRBC-hemoglobin remained stable in the 10 range  Mild thrombocytopenia-likely age-related-no further workup given likely trajectory and lack of benefit given advanced age and dementia  Impaired glucose tolerance severe dementia, advanced age and risk for hypoglycemia precludes aggressive management  Hypothyroidism-?  Graves' disease previously Continue Tapazole  5 mg daily  AKI on admission Secondary to poor p.o. etc.-improved-hydrate as possible-nursing aware to offer oral liquids every 2-3 hourly at bedside Saline locked 7/20 Underlying CKD 3A  Bipolar Continue BuSpar  2.5 every morning  Question of dementia Patient followed by [facility] in the outpatient  setting-    Chronic conditions not addressed Previous syncope with HFpEF 2015  Data Reviewed:   Hemoglobin stabilized in the 10.6 range, platelet 157, WBC 7.9 BUN/creatinine 29/0.8-->23/0.9-->18/0.7  DVT prophylaxis: SCD  Status is: Inpatient Remains inpatient appropriate because:  Likely will require skilled placement postprocedure   Called Son [name] on telephone [phone] on 7/19 but did not reach him   Subjective:  Arousing some minimally Not agitated overnight Mittens are off She seems to be comfortable overall  Objective + exam Vitals:   06/17/24 1553 06/17/24 2011 06/18/24 0002 06/18/24 0400  BP: 124/64 (!) 138/57 (!) 148/66 (!) 169/70  Pulse: 87 95 81 85  Resp: 18 20 17 17   Temp: 98.1 F (36.7 C) 98.4 F (36.9 C) 98.4 F (36.9 C) 98.4 F (36.9 C)  TempSrc: Oral Oral Oral Oral  SpO2: 100% 98% 98% 100%  Weight:      Height:       Filed Weights   06/15/24 1723 06/16/24 1104  Weight: 52.5 kg 52.5 kg    Examination:  EOMI NCAT no focal deficit Bitemporal wasting present Neck soft supple S1-S2 no murmur rub intact Abdomen soft no rebound   Scheduled Meds:  sodium chloride    Intravenous Once   sodium chloride    Intravenous Once   acetaminophen   650 mg Oral Q8H   busPIRone   2.5 mg Oral Daily   chlorhexidine   60 mL Topical Once   famotidine   20 mg Oral Daily   feeding supplement  237 mL Oral TID BM   methimazole   5 mg Oral Daily   multivitamin with minerals  1 tablet Oral Daily   povidone-iodine   2 Application Topical Once   Continuous Infusions:  sodium chloride  75 mL/hr at 06/17/24 1944    Time 15  Jai-Gurmukh  Royal, MD  [facility]

## 2024-06-18 NOTE — Progress Notes (Signed)
 Transition of Care [facility]) - CAGE-AID Screening   Patient Details  Name: Tracy Hayes MRN: [id number] Date of Birth: 1924-07-15  Transition of [facility] CM/SW Contact:    Bernardino Mayotte, RN Phone Number: 06/18/2024, 8:16 PM   Clinical Narrative:  Patient denies the use of alcohol and illicit substances. Resources not given at this time.  CAGE-AID Screening:    Have You Ever Felt You Ought to Cut Down on Your Drinking or Drug Use?: No Have People Annoyed You By Critizing Your Drinking Or Drug Use?: No Have You Felt Bad Or Guilty About Your Drinking Or Drug Use?: No Have You Ever Had a Drink or Used Drugs First Thing In The Morning to Steady Your Nerves or to Get Rid of a Hangover?: No CAGE-AID Score: 0  Substance Abuse Education Offered: No

## 2024-06-18 NOTE — Plan of Care (Signed)
  Problem: Education: Goal: Knowledge of General Education information will improve Description: Including pain rating scale, medication(s)/side effects and non-pharmacologic comfort measures Outcome: Progressing   Problem: Clinical Measurements: Goal: Ability to maintain clinical measurements within normal limits will improve Outcome: Progressing Goal: Will remain free from infection Outcome: Progressing Goal: Respiratory complications will improve Outcome: Progressing Goal: Cardiovascular complication will be avoided Outcome: Progressing   Problem: Activity: Goal: Risk for activity intolerance will decrease Outcome: Progressing   Problem: Nutrition: Goal: Adequate nutrition will be maintained Outcome: Progressing   Problem: Coping: Goal: Level of anxiety will decrease Outcome: Progressing   Problem: Elimination: Goal: Will not experience complications related to bowel motility Outcome: Progressing Goal: Will not experience complications related to urinary retention Outcome: Progressing   Problem: Pain Managment: Goal: General experience of comfort will improve and/or be controlled Outcome: Progressing   Problem: Safety: Goal: Ability to remain free from injury will improve Outcome: Progressing   Problem: Skin Integrity: Goal: Risk for impaired skin integrity will decrease Outcome: Progressing

## 2024-06-18 NOTE — Progress Notes (Signed)
   06/18/24 2310  BiPAP/CPAP/SIPAP  Reason BIPAP/CPAP not in use Non-compliant  BiPAP/CPAP /SiPAP Vitals  Pulse Rate 81  Resp 18  SpO2 99 %  Bilateral Breath Sounds Clear

## 2024-06-18 NOTE — Progress Notes (Signed)
   06/18/24 0002  BiPAP/CPAP/SIPAP  Reason BIPAP/CPAP not in use Non-compliant  BiPAP/CPAP /SiPAP Vitals  Temp 98.4 F (36.9 C)  Pulse Rate 81  Resp 17  BP (!) 148/66  SpO2 98 %  Bilateral Breath Sounds Clear  MEWS Score/Color  MEWS Score 0  MEWS Score Color Landy

## 2024-06-19 DIAGNOSIS — S72402A Unspecified fracture of lower end of left femur, initial encounter for closed fracture: Secondary | ICD-10-CM | POA: Diagnosis not present

## 2024-06-19 LAB — CBC
HCT: 32.6 % — ABNORMAL LOW (ref 36.0–46.0)
Hemoglobin: 11.1 g/dL — ABNORMAL LOW (ref 12.0–15.0)
MCH: 32.1 pg (ref 26.0–34.0)
MCHC: 34 g/dL (ref 30.0–36.0)
MCV: 94.2 fL (ref 80.0–100.0)
Platelets: 195 K/uL (ref 150–400)
RBC: 3.46 MIL/uL — ABNORMAL LOW (ref 3.87–5.11)
RDW: 15.6 % — ABNORMAL HIGH (ref 11.5–15.5)
WBC: 8 K/uL (ref 4.0–10.5)
nRBC: 0 % (ref 0.0–0.2)

## 2024-06-19 LAB — BASIC METABOLIC PANEL WITH GFR
Anion gap: 14 (ref 5–15)
BUN: 22 mg/dL (ref 8–23)
CO2: 20 mmol/L — ABNORMAL LOW (ref 22–32)
Calcium: 8.3 mg/dL — ABNORMAL LOW (ref 8.9–10.3)
Chloride: 109 mmol/L (ref 98–111)
Creatinine, Ser: 0.96 mg/dL (ref 0.44–1.00)
GFR, Estimated: 53 mL/min — ABNORMAL LOW (ref >60)
Glucose, Bld: 70 mg/dL (ref 70–99)
Potassium: 4 mmol/L (ref 3.5–5.1)
Sodium: 143 mmol/L (ref 135–145)

## 2024-06-19 MED ORDER — BUSPIRONE HCL 5 MG PO TABS: 2.5000 mg | ORAL_TABLET | Freq: Every day | ORAL | 0 refills | Status: AC

## 2024-06-19 MED ORDER — OXYCODONE HCL 5 MG PO TABS: 2.5000 mg | ORAL_TABLET | ORAL | 0 refills | Status: AC | PRN

## 2024-06-19 NOTE — Progress Notes (Signed)
 Report given to receiving nurse at [facility]. Patient awaiting transport. Fredie LITTIE Morones, RN

## 2024-06-19 NOTE — Plan of Care (Signed)

## 2024-06-19 NOTE — [place] FL2 (Addendum)
 [place]  MEDICAID FL2 LEVEL OF CARE FORM     IDENTIFICATION  Patient Name: Tracy Hayes Birthdate: 1924-10-02 Sex: female Admission Date (Current Location): 06/15/2024  [facility] and [place] Number:  Producer, television/film/video and Address:  [facility] [place], [address], [place], [place] [address]      Provider Number: [id number]  Attending Physician Name and Address:  Royal Sill, MD  Relative Name and Phone Number:       Current Level of Care: Hospital Recommended Level of Care: Assisted Living Facility, Memory Care Prior Approval Number:    Date Approved/Denied:   PASRR Number:    Discharge Plan: Other (Comment) (ALF)    Current Diagnoses: Patient Active Problem List   Diagnosis Date Noted   Hyperthyroidism 06/16/2024   CKD stage 3a, GFR 45-59 ml/min (HCC) 06/16/2024   Alzheimer's disease with early onset (HCC)    OSA (obstructive sleep apnea)    Closed fracture of left distal femur (HCC) 06/15/2024   Syncope 05/02/2014   Essential hypertension, benign 05/02/2014    Orientation RESPIRATION BLADDER Height & Weight     Self  Normal Incontinent  Weight: 115 lb 11.9 oz (52.5 kg) Height:  5' 7 (170.2 cm)  BEHAVIORAL SYMPTOMS/MOOD NEUROLOGICAL BOWEL NUTRITION STATUS      Incontinent  Diet (Dsyphagia 2)  AMBULATORY STATUS COMMUNICATION OF NEEDS Skin   Total Care Verbally Surgical wounds (closed surgical incision left hip)                       Personal Care Assistance Level of Assistance  Bathing, Feeding, Dressing Bathing Assistance: Maximum assistance Feeding assistance: Limited assistance Dressing Assistance: Maximum assistance     Functional Limitations Info  Sight, Hearing, Speech Sight Info: Impaired Hearing Info: Impaired Speech Info: Adequate    SPECIAL CARE FACTORS FREQUENCY                       Contractures Contractures Info: Not present    Additional Factors Info  Code Status, Allergies Code  Status Info: DNR LImited Allergies Info: Ace Inhibitors Medium Unspecified Other (See Comments) Unknown  Aspirin Medium Unspecified Other (See Comments) Unknown  Benadryl (diphenhydramine Hcl) Medium Unspecified Other (See Comments) Unknown  Codeine Medium Unspecified Other (See Comments) Unknown  Peanut-containing Drug Products Medium Unspecified Other (See Comments) Unknown  Penicillins Medium Unspecified Other (See Comments) Unknown  Phenobarbital Medium Unspecified Other (See Comments) Unknown  Sulfa Antibiotics Medium Unspecified Other (See Comments) Unknown  Vitamin B12 Medium Unspecified Other (See Comments) Unknown  Caffeine Low Hypersensitivity Palpitations           Current Medications (06/19/2024):  This is the current hospital active medication list Current Facility-Administered Medications  Medication Dose Route Frequency Provider Last Rate Last Admin   0.9 %  sodium chloride  infusion (Manually program via Guardrails IV Fluids)   Intravenous Once Deward Eck, PA-C       0.9 %  sodium chloride  infusion (Manually program via Guardrails IV Fluids)   Intravenous Once Deward Eck, PA-C       acetaminophen  (TYLENOL ) tablet 650 mg  650 mg Oral Q8H Samtani, Jai-Gurmukh, MD   650 mg at 06/19/24 0533   busPIRone  (BUSPAR ) tablet 2.5 mg  2.5 mg Oral Daily Deward Eck, PA-C   2.5 mg at 06/19/24 9094   chlorhexidine  (HIBICLENS ) 4 % liquid 4 Application  60 mL Topical Once Deward Eck, PA-C  famotidine  (PEPCID ) tablet 20 mg  20 mg Oral Daily Deward Eck, PA-C   20 mg at 06/19/24 9094   feeding supplement (ENSURE PLUS HIGH PROTEIN) liquid 237 mL  237 mL Oral TID BM Olubunmi Rothenberger, Clanford L, MD   237 mL at 06/19/24 0906   methimazole  (TAPAZOLE ) tablet 5 mg  5 mg Oral Daily Deward Eck, PA-C   5 mg at 06/19/24 9093   methocarbamol  (ROBAXIN ) tablet 500 mg  500 mg Oral Q6H PRN Deward Eck, PA-C   500 mg at 06/19/24 1052   Or   methocarbamol  (ROBAXIN ) injection 500 mg  500 mg Intravenous Q6H PRN Deward Eck, PA-C       multivitamin with minerals tablet 1 tablet  1 tablet Oral Daily Vicci, Clanford L, MD   1 tablet at 06/19/24 9094   oxyCODONE  (Oxy IR/ROXICODONE ) immediate release tablet 2.5-5 mg  2.5-5 mg Oral Q4H PRN Deward Eck, PA-C   5 mg at 06/16/24 2127   povidone-iodine  10 % swab 2 Application  2 Application Topical Once Deward Eck, PA-C         Discharge Medications: acetaminophen  325 MG tablet Commonly known as: TYLENOL  Take 650 mg by mouth every 6 (six) hours as needed.    alendronate 70 MG tablet Commonly known as: FOSAMAX Take 70 mg by mouth once a week. Take with a full glass of water on an empty stomach. Takes on Saturday    busPIRone  5 MG tablet Commonly known as: BUSPAR  Take 0.5 tablets (2.5 mg total) by mouth daily at 6 (six) AM.    famotidine  20 MG tablet Commonly known as: PEPCID  Take 20 mg by mouth daily.    methimazole  5 MG tablet Commonly known as: TAPAZOLE  Take 5 mg by mouth daily.    oxyCODONE  5 MG immediate release tablet Commonly known as: Oxy IR/ROXICODONE  Take 0.5-1 tablets (2.5-5 mg total) by mouth every 4 (four) hours as needed for severe pain (pain score 7-10).    Oyster Shell Calcium 500-400 MG-UNIT Tabs Take 1 tablet by mouth 2 (two) times daily.    Relevant Imaging Results:  Relevant Lab Results:   Additional Information SSN # [id number]  Montie LOISE Vicci, LCSW

## 2024-06-19 NOTE — Discharge Summary (Addendum)
 Physician Discharge Summary  Tracy Hayes FMW:[id number] DOB: 1924-10-19 DOA: 06/15/2024  PCP: Pcp, No  Admit date: 06/15/2024 Discharge date: 06/19/2024  Time spent: 46 minutes  Recommendations for Outpatient Follow-up:  Needs TSH 3 weeks Needs CHme 7 and cbc 1 week [facility] to follow as OP  Discharge Diagnoses:  MAIN problem for hospitalization   Hip #  Please see below for itemized issues addressed in HOpsital- refer to other progress notes for clarity if needed  Discharge Condition:  fair  Diet recommendation:  dys ii  Filed Weights   06/15/24 1723 06/16/24 1104  Weight: 52.5 kg 52.5 kg    History of present illness:  89 year old female skilled facility resident-baseline wheelchair-bound Previous syncope worked up Dr. Willaim OV 2015 echo 60-65% chronic diastolic failure HTN Hypothyroid bipolar CKD 3 A   S/p mechanical fall 7/15 @SNF -[facility] to [facility] ED-Dr. Celena trauma service consulted-family D/W orthopedics-agreeable ORIF palliative measures given chair bound 7/18 left periprosthetic supracondylar femur fracture repair with ORIF and [employer] plate Dr. Celena      Plan   Left periprosthetic supracondylar femoral fracture status post-repair scheduled Tylenol  650 every 8 -prn Oxy IR 2.5 every 4 as needed --- rx given at d/c--trial avoiding strong IV narcotics in 89 year old   Dysphagia-was unable to clear secretions so downgraded by speech to dysphagia 2 diet Nursing aware to ensure is close to liquids to prevent dehydration--watch mentation as well as oxygenation-if decompensates we will have to have discussions with family as an OP--son understands gravity of overall conditions-seems realistic about her abilites   Anemia of acute perioperative blood loss expected Transfused this admission 2 units PRBC-hemoglobin remained stable in the 10 range   Mild thrombocytopenia-likely age-related-no further workup given likely trajectory and lack of  benefit given advanced age and dementia   Impaired glucose tolerance severe dementia, advanced age and risk for hypoglycemia precludes aggressive management   Hypothyroidism-?  Graves' disease previously Continue Tapazole  5 mg daily--need TSH in 3 weeks   AKI on admission Secondary to poor p.o. etc.and Thiazide-improved-hydrate as possibleoffer liquids prn Saline locked 7/20 Underlying CKD 3A   Bipolar Continue BuSpar  2.5 every morning--held Sertraline to reduce polypharmacy as OP can reconsider re-ordering   Question of dementia Patient followed [facility] in [facility] setting-[facility]       Chronic conditions not addressed Previous syncope with HFpEF 2015    Discharge Exam: Vitals:   06/18/24 2310 06/19/24 0416  BP:    Pulse: 81   Resp: 18   Temp: 98.2 F (36.8 C) 98.4 F (36.9 C)  SpO2: 99%     Subj on day of d/c   Well no distress--history form RN--ate this am without issue/cough.  Drinking well Pain controlled  General Exam on discharge   Eomi ncat no focal deficit Cta b no added sound S1 s2 no m No Le edema L sided wounds look clean  Discharge Instructions   Discharge Instructions     Diet - low sodium heart healthy   Complete by: As directed    Increase activity slowly   Complete by: As directed    Leave dressing on - Keep it clean, dry, and intact until clinic visit   Complete by: As directed       Allergies as of 06/19/2024       Reactions   Ace Inhibitors Other (See Comments)   Unknown    Aspirin Other (See Comments)   Unknown  Benadryl [diphenhydramine Hcl] Other (See Comments)   Unknown    Codeine Other (See Comments)   Unknown    Peanut-containing Drug Products Other (See Comments)   Unknown    Penicillins Other (See Comments)   Unknown    Phenobarbital Other (See Comments)   Unknown    Sulfa Antibiotics Other (See Comments)   Unknown    Vitamin B12 Other (See Comments)    Unknown    Caffeine Palpitations        Medication List     STOP taking these medications    fluticasone 50 MCG/ACT nasal spray Commonly known as: FLONASE   loperamide 2 MG tablet Commonly known as: IMODIUM A-D   LORazepam 0.5 MG tablet Commonly known as: ATIVAN   losartan-hydrochlorothiazide 100-12.5 MG tablet Commonly known as: HYZAAR   predniSONE 50 MG tablet Commonly known as: DELTASONE   ranitidine 150 MG tablet Commonly known as: ZANTAC   sertraline 50 MG tablet Commonly known as: ZOLOFT       TAKE these medications    acetaminophen  325 MG tablet Commonly known as: TYLENOL  Take 650 mg by mouth every 6 (six) hours as needed.   alendronate 70 MG tablet Commonly known as: FOSAMAX Take 70 mg by mouth once a week. Take with a full glass of water on an empty stomach. Takes on Saturday   busPIRone  5 MG tablet Commonly known as: BUSPAR  Take 0.5 tablets (2.5 mg total) by mouth daily at 6 (six) AM.   famotidine  20 MG tablet Commonly known as: PEPCID  Take 20 mg by mouth daily.   methimazole  5 MG tablet Commonly known as: TAPAZOLE  Take 5 mg by mouth daily.   oxyCODONE  5 MG immediate release tablet Commonly known as: Oxy IR/ROXICODONE  Take 0.5-1 tablets (2.5-5 mg total) by mouth every 4 (four) hours as needed for severe pain (pain score 7-10).   Oyster Shell Calcium 500-400 MG-UNIT Tabs Take 1 tablet by mouth 2 (two) times daily.               Discharge Care Instructions  (From admission, onward)           Start     Ordered   06/19/24 0000  Leave dressing on - Keep it clean, dry, and intact until clinic visit        06/19/24 0822           Allergies  Allergen Reactions   Ace Inhibitors Other (See Comments)    Unknown    Aspirin Other (See Comments)    Unknown    Benadryl [Diphenhydramine Hcl] Other (See Comments)    Unknown    Codeine Other (See Comments)    Unknown    Peanut-Containing Drug Products Other (See Comments)     Unknown    Penicillins Other (See Comments)    Unknown    Phenobarbital Other (See Comments)    Unknown    Sulfa Antibiotics Other (See Comments)    Unknown    Vitamin B12 Other (See Comments)    Unknown    Caffeine Palpitations      The results of significant diagnostics from this hospitalization (including imaging, microbiology, ancillary and laboratory) are listed below for reference.    Significant Diagnostic Studies: DG Knee Left Port Result Date: 06/16/2024 CLINICAL DATA:  Postop ORIF EXAM: PORTABLE LEFT KNEE - 2 VIEW COMPARISON:  Preop femur x-ray 06/15/2024 FINDINGS: Interval placement of a lateral fixation plate and screws transfixing the comminuted distal femoral metaphyseal fracture just proximal  to the femoral component of the total knee arthroplasty. Expected alignment. Overlapping soft tissue gas and thickening. Otherwise stable appearance to the components of the arthroplasty including patellar button. Osteopenia. Scattered vascular calcifications. Imaging was obtained to aid in treatment. IMPRESSION: Status post ORIF with fixation plate and screws transfixing the distal femoral metaphyseal fracture. Knee arthroplasty. Electronically Signed   By: Ranell Bring M.D.   On: 06/16/2024 17:59   DG FEMUR MIN 2 VIEWS LEFT Result Date: 06/16/2024 CLINICAL DATA:  Elective surgery. EXAM: LEFT FEMUR 2 VIEWS COMPARISON:  Preoperative imaging FINDINGS: Six fluoroscopic spot views of the left femur submitted from the operating room. Imaging obtained during plate and screw fixation of distal femur fracture. Previous knee arthroplasty. Fluoroscopy time 68.5 seconds. Dose 4.96 mGy. IMPRESSION: Intraoperative fluoroscopy during distal femur fracture fixation. Electronically Signed   By: Andrea Gasman M.D.   On: 06/16/2024 15:01   DG C-Arm 1-60 Min-No Report Result Date: 06/16/2024 Fluoroscopy was utilized by the requesting physician.  No radiographic interpretation.   DG C-Arm 1-60 Min-No  Report Result Date: 06/16/2024 Fluoroscopy was utilized by the requesting physician.  No radiographic interpretation.   Chest Portable 1 View Result Date: 06/16/2024 EXAM: 1 VIEW XRAY OF THE CHEST 06/16/2024 12:33:00 AM COMPARISON: Chest radiograph dated 08/26/2023. CLINICAL HISTORY: Preoperative examination - Closed fracture of left distal femur. FINDINGS: LUNGS AND PLEURA: No focal consolidation, pleural effusion, or pneumothorax. HEART AND MEDIASTINUM: Stable cardiomediastinal silhouette. Aortic atherosclerotic calcification. BONES AND SOFT TISSUES: No acute osseous abnormality. IMPRESSION: 1. No acute cardiopulmonary pathology. Electronically signed by: Norman Gatlin MD 06/16/2024 12:50 AM EDT RP Workstation: [id number]   DG FEMUR MIN 2 VIEWS LEFT Result Date: 06/15/2024 EXAM: 2 VIEW(S) XRAY OF THE LEFT FEMUR 06/15/2024 08:40:00 PM COMPARISON: Same day femur radiographs. CLINICAL HISTORY: 809823 Fall 190176. Post reduction FINDINGS: BONES AND JOINTS: Redemonstrated periprosthetic femur fracture about the femoral component of the TKA. There is decreased but remaining significant apex anterior angulation, posterior displacement of the distal fragment, and foreshortening. SOFT TISSUES: Adjacent soft tissue swelling. IMPRESSION: 1. Redemonstrated periprosthetic femur fracture about the femoral component of the TKA. There is decreased but remaining significant apex anterior angulation, posterior displacement of the distal fragment, and foreshortening. Electronically signed by: Norman Gatlin MD 06/15/2024 08:47 PM EDT RP Workstation: [id number]   DG Femur Min 2 Views Left Result Date: 06/15/2024 CLINICAL DATA:  History of femur fracture EXAM: LEFT FEMUR 2 VIEWS COMPARISON:  07/21/2023 report, pelvis radiograph 08/26/2023 FINDINGS: Left femoral head projects in joint. Knee replacement. Acute fracture just above the femoral prosthesis with greater than 1 shaft diameter posterior displacement of distal  fracture fragment and marked apex anterior angulation at the fracture site. Generalized soft tissue edema IMPRESSION: Acute displaced and angulated fracture just above the femoral component of the knee replacement Electronically Signed   By: Luke Bun M.D.   On: 06/15/2024 18:53    Microbiology: Recent Results (from the past 240 hours)  Surgical pcr screen     Status: None   Collection Time: 06/16/24 11:10 AM   Specimen: Nasal Mucosa; Nasal Swab  Result Value Ref Range Status   MRSA, PCR NEGATIVE NEGATIVE Final   Staphylococcus aureus NEGATIVE NEGATIVE Final    Comment: (NOTE) The Xpert SA Assay (FDA approved for NASAL specimens in patients 68 years of age and older), is one component of a comprehensive surveillance program. It is not intended to diagnose infection nor to guide or monitor treatment. Performed at [facility], [address], [place], [place] [address]      Labs: Basic Metabolic Panel: Recent Labs  Lab 06/15/24 1739 06/16/24 0456 06/17/24 0427 06/18/24 0521  NA 142 142 139 140  K 4.2 3.9 3.9 3.7  CL 105 108 108 110  CO2 26 25 25  20*  GLUCOSE 113* 108* 90 98  BUN 36* 29* 23 18  CREATININE 1.08* 0.86 0.97 0.79  CALCIUM 9.0 8.3* 7.9* 7.8*   Liver Function Tests: No results for input(s): AST, ALT, ALKPHOS, BILITOT, PROT, ALBUMIN in the last 168 hours. No results for input(s): LIPASE, AMYLASE in the last 168 hours. No results for input(s): AMMONIA in the last 168 hours. CBC: Recent Labs  Lab 06/15/24 1739 06/16/24 0456 06/17/24 0427 06/18/24 0521  WBC 8.2 8.0 9.9 7.8  NEUTROABS 5.5 5.3  --   --   HGB 8.9* 7.8* 10.9* 10.6*  HCT 26.5* 23.9* 32.1* 31.5*  MCV 99.6 100.8* 92.5 93.2  PLT 174 150 150 157   Cardiac Enzymes: No results for input(s): CKTOTAL, CKMB, CKMBINDEX, TROPONINI in the last 168 hours. BNP: BNP (last 3 results) No results for input(s): BNP in the last 8760 hours.  ProBNP (last 3 results) No  results for input(s): PROBNP in the last 8760 hours.  CBG: No results for input(s): GLUCAP in the last 168 hours.  Signed:  Colen Grimes MD   [facility] 06/19/2024, 8:22 AM

## 2024-06-19 NOTE — TOC Transition Note (Signed)
 Transition of Care [facility]) - Discharge Note   Patient Details  Name: Tracy Hayes MRN: [id number] Date of Birth: [birth year]  [facility] CM/SW Contact:  Montie LOISE Louder, LCSW Phone Number: 06/19/2024, 2:12 PM   Clinical Narrative:     Patient will Discharge to: [facility] Discharge Date: 06/19/24 Family Notified: son Transport By: EMS  Per MD patient is ready for discharge. RN, patient, and facility notified of discharge. Discharge Summary sent to facility. RN given number for [phone]. Ambulance transport requested for patient.   Clinical Social Worker signing off.  Montie Louder, MSW, LCSW Clinical Social Worker       Barriers to Discharge: Barriers Resolved   Patient Goals and CMS Choice            Discharge Placement                       Discharge Plan and Services Additional resources added to the After Visit Summary for                                       Social Drivers of Health (SDOH) Interventions SDOH Screenings   Food Insecurity: No Food Insecurity (06/16/2024)  Housing: Low Risk  (06/16/2024)  Transportation Needs: No Transportation Needs (06/16/2024)  Utilities: Not At Risk (06/16/2024)  Social Connections: Moderately Isolated (06/16/2024)  Tobacco Use: Low Risk  (06/16/2024)     Readmission Risk Interventions    06/16/2024    9:31 AM  Readmission Risk Prevention Plan  Transportation Screening Complete  PCP or Specialist Appt within 5-7 Days Complete  Home Care Screening Complete  Medication Review (RN CM) Complete

## 2024-06-19 NOTE — Plan of Care (Signed)
  Problem: Education: Goal: Knowledge of General Education information will improve Description: Including pain rating scale, medication(s)/side effects and non-pharmacologic comfort measures Outcome: Progressing   Problem: Clinical Measurements: Goal: Ability to maintain clinical measurements within normal limits will improve Outcome: Progressing Goal: Will remain free from infection Outcome: Progressing Goal: Respiratory complications will improve Outcome: Progressing Goal: Cardiovascular complication will be avoided Outcome: Progressing   Problem: Activity: Goal: Risk for activity intolerance will decrease Outcome: Progressing   Problem: Nutrition: Goal: Adequate nutrition will be maintained Outcome: Progressing   Problem: Coping: Goal: Level of anxiety will decrease Outcome: Progressing   Problem: Elimination: Goal: Will not experience complications related to bowel motility Outcome: Progressing Goal: Will not experience complications related to urinary retention Outcome: Progressing   Problem: Pain Managment: Goal: General experience of comfort will improve and/or be controlled Outcome: Progressing   Problem: Safety: Goal: Ability to remain free from injury will improve Outcome: Progressing   Problem: Skin Integrity: Goal: Risk for impaired skin integrity will decrease Outcome: Progressing

## 2024-06-19 NOTE — Discharge Instructions (Addendum)
 [facility] Discharge Instructions   General Discharge Instructions  WEIGHT BEARING STATUS: Weightbearing as tolerated left leg  RANGE OF MOTION/ACTIVITY: No formal restrictions  Bone health:   Review the following resource for additional information regarding bone health  [email or web address]  Wound Care: Daily wound care as needed  Discharge Wound Care Instructions  Do NOT apply any ointments, solutions or lotions to pin sites or surgical wounds.  These prevent needed drainage and even though solutions like hydrogen peroxide kill bacteria, they also damage cells lining the pin sites that help fight infection.  Applying lotions or ointments can keep the wounds moist and can cause them to breakdown and open up as well. This can increase the risk for infection. When in doubt call the office.  Surgical incisions should be dressed daily.  If any drainage is noted, use one layer of adaptic or Mepitel, then gauze, and tape.  Alternatively can use a silicone foam dressing such as a Mepilex  [email or web address][employer]cz [email or web address]://[email or web address]-soto.com/?pd_rd_[email or web address]&th=1  [email or web address].[email or web address]  These dressing supplies should be available at local medical supply stores ([employer], [facility] [employer], etc). They are not usually carried at places like [facility], [facility], [facility], etc  Once the incision is completely dry and without drainage, it may be left open to air out.  Showering may begin 36-48 hours later.  Cleaning gently with soap and water.   Diet: as you were eating previously.  Can use over the counter stool softeners and bowel preparations, such as Miralax, to help with bowel movements.  Narcotics can be constipating.  Be sure to drink plenty of fluids  PAIN MEDICATION  USE AND EXPECTATIONS  You have likely been given narcotic medications to help control your pain.  After a traumatic event that results in an fracture (broken bone) with or without surgery, it is ok to use narcotic pain medications to help control one's pain.  We understand that everyone responds to pain differently and each individual patient will be evaluated on a regular basis for the continued need for narcotic medications. Ideally, narcotic medication use should last no more than 6-8 weeks (coinciding with fracture healing).   As a patient it is your responsibility as well to monitor narcotic medication use and report the amount and frequency you use these medications when you come to your office visit.   We would also advise that if you are using narcotic medications, you should take a dose prior to therapy to maximize you participation.  IF YOU ARE ON NARCOTIC MEDICATIONS IT IS NOT PERMISSIBLE TO OPERATE A MOTOR VEHICLE (MOTORCYCLE/CAR/TRUCK/MOPED) OR HEAVY MACHINERY DO NOT MIX NARCOTICS WITH OTHER CNS (CENTRAL NERVOUS SYSTEM) DEPRESSANTS SUCH AS ALCOHOL   POST-OPERATIVE OPIOID TAPER INSTRUCTIONS: It is important to wean off of your opioid medication as soon as possible. If you do not need pain medication after your surgery it is ok to stop day one. Opioids include: Codeine, Hydrocodone(Norco, Vicodin), Oxycodone (Percocet, oxycontin ) and hydromorphone amongst others.  Long term and even short term use of opiods can cause: Increased pain response Dependence Constipation Depression Respiratory depression And more.  Withdrawal symptoms can include Flu like symptoms Nausea, vomiting And more Techniques to manage these symptoms Hydrate well Eat regular healthy meals Stay active Use relaxation techniques(deep breathing, meditating, yoga) Do Not substitute Alcohol to help with tapering If you have been on opioids for less than two weeks and do not have pain than it is ok to stop all  together.  Plan to wean off of opioids This plan  should start within one week post op of your fracture surgery  Maintain the same interval or time between taking each dose and first decrease the dose.  Cut the total daily intake of opioids by one tablet each day Next start to increase the time between doses. The last dose that should be eliminated is the evening dose.    STOP SMOKING OR USING NICOTINE PRODUCTS!!!!  As discussed nicotine severely impairs your body's ability to heal surgical and traumatic wounds but also impairs bone healing.  Wounds and bone heal by forming microscopic blood vessels (angiogenesis) and nicotine is a vasoconstrictor (essentially, shrinks blood vessels).  Therefore, if vasoconstriction occurs to these microscopic blood vessels they essentially disappear and are unable to deliver necessary nutrients to the healing tissue.  This is one modifiable factor that you can do to dramatically increase your chances of healing your injury.    (This means no smoking, no nicotine gum, patches, etc)  DO NOT USE NONSTEROIDAL ANTI-INFLAMMATORY DRUGS (NSAID'S)  Using products such as Advil (ibuprofen), Aleve (naproxen), Motrin (ibuprofen) for additional pain control during fracture healing can delay and/or prevent the healing response.  If you would like to take over the counter (OTC) medication, Tylenol  (acetaminophen ) is ok.  However, some narcotic medications that are given for pain control contain acetaminophen  as well. Therefore, you should not exceed more than 4000 mg of tylenol  in a day if you do not have liver disease.  Also note that there are may OTC medicines, such as cold medicines and allergy medicines that my contain tylenol  as well.  If you have any questions about medications and/or interactions please ask your doctor/PA or your pharmacist.      ICE AND ELEVATE INJURED/OPERATIVE EXTREMITY  Using ice and elevating the injured extremity above your heart can help with  swelling and pain control.  Icing in a pulsatile fashion, such as 20 minutes on and 20 minutes off, can be followed.    Do not place ice directly on skin. Make sure there is a barrier between to skin and the ice pack.    Using frozen items such as frozen peas works well as the conform nicely to the are that needs to be iced.  USE AN ACE WRAP OR TED HOSE FOR SWELLING CONTROL  In addition to icing and elevation, Ace wraps or TED hose are used to help limit and resolve swelling.  It is recommended to use Ace wraps or TED hose until you are informed to stop.    When using Ace Wraps start the wrapping distally (farthest away from the body) and wrap proximally (closer to the body)   Example: If you had surgery on your leg and you do not have a splint on, start the ace wrap at the toes and work your way up to the thigh        If you had surgery on your upper extremity and do not have a splint on, start the ace wrap at your fingers and work your way up to the upper arm  IF YOU ARE IN A SPLINT OR CAST DO NOT REMOVE IT FOR ANY REASON   If your splint gets wet for any reason please contact the office immediately. You may shower in your splint or cast as long as you keep it dry.  This can be done by wrapping in a cast cover or garbage back (or similar)  Do Not stick any thing down your splint or cast such as pencils, money,  or hangers to try and scratch yourself with.  If you feel itchy take benadryl as prescribed on the bottle for itching  IF YOU ARE IN A CAM BOOT (BLACK BOOT)  You may remove boot periodically. Perform daily dressing changes as noted below.  Wash the liner of the boot regularly and wear a sock when wearing the boot. It is recommended that you sleep in the boot until told otherwise    Call office for the following: Temperature greater than 101F Persistent nausea and vomiting Severe uncontrolled pain Redness, tenderness, or signs of infection (pain, swelling, redness, odor or green/yellow  discharge around the site) Difficulty breathing, headache or visual disturbances Hives Persistent dizziness or light-headedness Extreme fatigue Any other questions or concerns you may have after discharge  In an emergency, call 911 or go to an Emergency Department at a nearby hospital  HELPFUL INFORMATION  If you had a block, it will wear off between 8-24 hrs postop typically.  This is period when your pain may go from nearly zero to the pain you would have had postop without the block.  This is an abrupt transition but nothing dangerous is happening.  You may take an extra dose of narcotic when this happens.  You should wean off your narcotic medicines as soon as you are able.  Most patients will be off or using minimal narcotics before their first postop appointment.   We suggest you use the pain medication the first night prior to going to bed, in order to ease any pain when the anesthesia wears off. You should avoid taking pain medications on an empty stomach as it will make you nauseous.  Do not drink alcoholic beverages or take illicit drugs when taking pain medications.  In most states it is against the law to drive while you are in a splint or sling.  And certainly against the law to drive while taking narcotics.  You may return to work/school in the next couple of days when you feel up to it.   Pain medication may make you constipated.  Below are a few solutions to try in this order: Decrease the amount of pain medication if you aren't having pain. Drink lots of decaffeinated fluids. Drink prune juice and/or each dried prunes  If the first 3 don't work start with additional solutions Take Colace - an over-the-counter stool softener Take Senokot - an over-the-counter laxative Take Miralax - a stronger over-the-counter laxative     CALL THE OFFICE WITH ANY QUESTIONS OR CONCERNS: [phone]   VISIT OUR WEBSITE FOR ADDITIONAL INFORMATION: [email or web address]

## 2024-06-19 NOTE — TOC Progression Note (Signed)
 Transition of Care [facility]) - Progression Note    Patient Details  Name: Tracy Hayes MRN: [id number] Date of Birth: 07-10-1924  Transition of Care [facility]) CM/SW Contact  Montie LOISE Louder, [place] Phone Number: 06/19/2024, 1:49 PM  Clinical Narrative:     9:51 am- spoke with patient' son - informed of anticipated d/c to [facility].  10:22 am- confirmed patient is with [facility] not [facility] 11:07 am - spoke  with SW Rosina- informed patient will be discharged today.  1:45 pm  - ALF confirmed they have received and reviewed FL2   Montie Louder, MSW, LCSW Clinical Social Worker       Barriers to Discharge: Barriers Resolved  Expected Discharge Plan and Services         Expected Discharge Date: 06/19/24                                     Social Determinants of Health (SDOH) Interventions SDOH Screenings   Food Insecurity: No Food Insecurity (06/16/2024)  Housing: Low Risk  (06/16/2024)  Transportation Needs: No Transportation Needs (06/16/2024)  Utilities: Not At Risk (06/16/2024)  Social Connections: Moderately Isolated (06/16/2024)  Tobacco Use: Low Risk  (06/16/2024)    Readmission Risk Interventions    06/16/2024    9:31 AM  Readmission Risk Prevention Plan  Transportation Screening Complete  PCP or Specialist Appt within 5-7 Days Complete  Home Care Screening Complete  Medication Review (RN CM) Complete

## 2024-06-20 ENCOUNTER — Encounter (HOSPITAL_COMMUNITY): Payer: Self-pay | Admitting: Orthopedic Surgery
